# Patient Record
Sex: Female | Born: 1940 | Race: White | Hispanic: No | Smoking: Former smoker
Health system: Southern US, Community
[De-identification: ages and names within clinical notes are randomized; demographics above are authoritative.]

## PROBLEM LIST (undated history)

## (undated) DIAGNOSIS — E7800 Pure hypercholesterolemia, unspecified: Secondary | ICD-10-CM

## (undated) DIAGNOSIS — R51 Headache: Secondary | ICD-10-CM

## (undated) DIAGNOSIS — M810 Age-related osteoporosis without current pathological fracture: Secondary | ICD-10-CM

## (undated) DIAGNOSIS — R109 Unspecified abdominal pain: Secondary | ICD-10-CM

## (undated) DIAGNOSIS — T8859XA Other complications of anesthesia, initial encounter: Secondary | ICD-10-CM

## (undated) DIAGNOSIS — K2100 Gastro-esophageal reflux disease with esophagitis, without bleeding: Secondary | ICD-10-CM

## (undated) DIAGNOSIS — G43909 Migraine, unspecified, not intractable, without status migrainosus: Secondary | ICD-10-CM

## (undated) DIAGNOSIS — R05 Cough: Secondary | ICD-10-CM

## (undated) DIAGNOSIS — I10 Essential (primary) hypertension: Secondary | ICD-10-CM

## (undated) DIAGNOSIS — R059 Cough, unspecified: Secondary | ICD-10-CM

## (undated) DIAGNOSIS — K5900 Constipation, unspecified: Secondary | ICD-10-CM

## (undated) DIAGNOSIS — N816 Rectocele: Secondary | ICD-10-CM

## (undated) DIAGNOSIS — M545 Low back pain, unspecified: Secondary | ICD-10-CM

## (undated) DIAGNOSIS — J019 Acute sinusitis, unspecified: Secondary | ICD-10-CM

## (undated) DIAGNOSIS — F329 Major depressive disorder, single episode, unspecified: Secondary | ICD-10-CM

## (undated) DIAGNOSIS — F32A Depression, unspecified: Secondary | ICD-10-CM

## (undated) DIAGNOSIS — L255 Unspecified contact dermatitis due to plants, except food: Secondary | ICD-10-CM

## (undated) DIAGNOSIS — E785 Hyperlipidemia, unspecified: Secondary | ICD-10-CM

## (undated) DIAGNOSIS — Z0389 Encounter for observation for other suspected diseases and conditions ruled out: Secondary | ICD-10-CM

## (undated) DIAGNOSIS — R21 Rash and other nonspecific skin eruption: Secondary | ICD-10-CM

## (undated) DIAGNOSIS — R252 Cramp and spasm: Secondary | ICD-10-CM

## (undated) DIAGNOSIS — F3289 Other specified depressive episodes: Secondary | ICD-10-CM

## (undated) HISTORY — DX: Cramp and spasm: R25.2

## (undated) HISTORY — DX: Gastro-esophageal reflux disease with esophagitis: K21.0

## (undated) HISTORY — DX: Major depressive disorder, single episode, unspecified: F32.9

## (undated) HISTORY — DX: Headache: R51

## (undated) HISTORY — DX: Depression, unspecified: F32.A

## (undated) HISTORY — DX: Hyperlipidemia, unspecified: E78.5

## (undated) HISTORY — DX: Constipation, unspecified: K59.00

## (undated) HISTORY — PX: APPENDECTOMY: SHX54

## (undated) HISTORY — DX: Rectocele: N81.6

## (undated) HISTORY — DX: Encounter for observation for other suspected diseases and conditions ruled out: Z03.89

## (undated) HISTORY — DX: Migraine, unspecified, not intractable, without status migrainosus: G43.909

## (undated) HISTORY — DX: Cough: R05

## (undated) HISTORY — DX: Essential (primary) hypertension: I10

## (undated) HISTORY — DX: Other specified depressive episodes: F32.89

## (undated) HISTORY — DX: Low back pain: M54.5

## (undated) HISTORY — DX: Pure hypercholesterolemia, unspecified: E78.00

## (undated) HISTORY — DX: Unspecified abdominal pain: R10.9

## (undated) HISTORY — DX: Rash and other nonspecific skin eruption: R21

## (undated) HISTORY — DX: Acute sinusitis, unspecified: J01.90

## (undated) HISTORY — DX: Low back pain, unspecified: M54.50

## (undated) HISTORY — DX: Gastro-esophageal reflux disease with esophagitis, without bleeding: K21.00

## (undated) HISTORY — DX: Age-related osteoporosis without current pathological fracture: M81.0

## (undated) HISTORY — DX: Unspecified contact dermatitis due to plants, except food: L25.5

## (undated) HISTORY — PX: BREAST BIOPSY: SHX20

## (undated) HISTORY — DX: Cough, unspecified: R05.9

---

## 2000-02-20 ENCOUNTER — Encounter: Admission: RE | Admit: 2000-02-20 | Discharge: 2000-02-20 | Payer: Self-pay | Admitting: Family Medicine

## 2000-02-20 ENCOUNTER — Encounter: Payer: Self-pay | Admitting: Family Medicine

## 2000-09-05 ENCOUNTER — Encounter: Payer: Self-pay | Admitting: Family Medicine

## 2000-09-05 ENCOUNTER — Encounter: Admission: RE | Admit: 2000-09-05 | Discharge: 2000-09-05 | Payer: Self-pay | Admitting: Family Medicine

## 2001-02-16 ENCOUNTER — Encounter: Payer: Self-pay | Admitting: Family Medicine

## 2001-02-16 ENCOUNTER — Encounter: Admission: RE | Admit: 2001-02-16 | Discharge: 2001-02-16 | Payer: Self-pay | Admitting: Family Medicine

## 2002-02-18 ENCOUNTER — Encounter: Payer: Self-pay | Admitting: Family Medicine

## 2002-02-18 ENCOUNTER — Encounter: Admission: RE | Admit: 2002-02-18 | Discharge: 2002-02-18 | Payer: Self-pay | Admitting: Family Medicine

## 2003-02-22 ENCOUNTER — Encounter: Admission: RE | Admit: 2003-02-22 | Discharge: 2003-02-22 | Payer: Self-pay | Admitting: Family Medicine

## 2003-02-22 ENCOUNTER — Encounter: Payer: Self-pay | Admitting: Family Medicine

## 2004-03-28 ENCOUNTER — Encounter: Admission: RE | Admit: 2004-03-28 | Discharge: 2004-03-28 | Payer: Self-pay | Admitting: Family Medicine

## 2004-04-15 ENCOUNTER — Emergency Department (HOSPITAL_COMMUNITY): Admission: EM | Admit: 2004-04-15 | Discharge: 2004-04-16 | Payer: Self-pay | Admitting: Emergency Medicine

## 2005-03-29 ENCOUNTER — Encounter: Admission: RE | Admit: 2005-03-29 | Discharge: 2005-03-29 | Payer: Self-pay | Admitting: Family Medicine

## 2006-04-08 ENCOUNTER — Encounter: Admission: RE | Admit: 2006-04-08 | Discharge: 2006-04-08 | Payer: Self-pay | Admitting: Family Medicine

## 2007-04-21 ENCOUNTER — Encounter: Admission: RE | Admit: 2007-04-21 | Discharge: 2007-04-21 | Payer: Self-pay | Admitting: Family Medicine

## 2007-08-25 ENCOUNTER — Encounter: Admission: RE | Admit: 2007-08-25 | Discharge: 2007-08-25 | Payer: Self-pay | Admitting: Family Medicine

## 2008-04-21 ENCOUNTER — Encounter: Admission: RE | Admit: 2008-04-21 | Discharge: 2008-04-21 | Payer: Self-pay | Admitting: Family Medicine

## 2009-04-24 ENCOUNTER — Encounter: Admission: RE | Admit: 2009-04-24 | Discharge: 2009-04-24 | Payer: Self-pay | Admitting: Family Medicine

## 2010-04-27 ENCOUNTER — Encounter: Admission: RE | Admit: 2010-04-27 | Discharge: 2010-04-27 | Payer: Self-pay | Admitting: Family Medicine

## 2011-04-22 ENCOUNTER — Other Ambulatory Visit: Payer: Self-pay | Admitting: Family Medicine

## 2011-04-22 DIAGNOSIS — Z1231 Encounter for screening mammogram for malignant neoplasm of breast: Secondary | ICD-10-CM

## 2011-04-25 ENCOUNTER — Ambulatory Visit (INDEPENDENT_AMBULATORY_CARE_PROVIDER_SITE_OTHER): Payer: Medicare Other | Admitting: Internal Medicine

## 2011-04-25 ENCOUNTER — Encounter: Payer: Self-pay | Admitting: Internal Medicine

## 2011-04-25 VITALS — BP 110/66 | HR 79 | Ht 64.25 in | Wt 125.4 lb

## 2011-04-25 DIAGNOSIS — G4720 Circadian rhythm sleep disorder, unspecified type: Secondary | ICD-10-CM

## 2011-04-25 NOTE — Patient Instructions (Signed)
Bright light may help you be more awake and ready to get up in the morning, so putting a light on a timer, and getting outdoors for a little while in the morning, can hep your brain know that it is morning.  Melatonin, 6 mg, 30 minutes before bed, can also help your brain understand that it it bedtime. Remember to turn the evening lighting down a little.  You can combine this with the hydroxyzine if you find that helps.

## 2011-04-25 NOTE — Progress Notes (Signed)
04/25/11- 70 year old female former smoker, wife of a patient here, referred courtesy of Dr. Doristine Counter for sleep medicine evaluation. She complains of difficulty falling asleep and difficulty getting up in the morning. She finds herself going to bed later than she used to and getting up later. This may have begun around the time she retired a year and a half ago. It takes up to an hour to fall asleep and she thinks she wakes 6 times during the night before getting up at 8 AM. While sleeping, she thinks she sleeps soundly. She estimates averaging 7-1/2 hours of sleep per night. She does word puzzles before falling asleep, rather than TV. She was working, bedtime is to be 10 PM and she got up at 5:30 AM. She feels that she ought to be keeping those same hours now, in the same way that she tries to manage her weight. She had a sleep study 04/24/2006 at [facility]. Sleep latency then was 10 minutes, REM latency 316 minutes with total sleep time 297 minutes. She was awake 45 minutes during the night, with 13 minutes and REM. AHI was 0. Oxygenation was normal with moderate snoring. She had a few limb jerks without significant arousal. She waits for her husband to go to bed, so that he won't disturb her. This contributes to her later bedtime. Her bedroom is comfortable. She has begun taking hydroxyzine at bedtime for sleep and for itching. Drinks 6 cups of coffee per day. Her concern is sleep schedule, not quality. She is not sleepy during the daytime.  ROS-see HPI Constitutional:   No-   weight loss, night sweats, fevers, chills, fatigue, lassitude. HEENT:   No-  headaches, difficulty swallowing, tooth/dental problems, sore throat,       No-  sneezing, itching, ear ache, nasal congestion, post nasal drip,  CV:  No-   chest pain, orthopnea, PND, swelling in lower extremities, anasarca, dizziness, palpitations Resp: No-   shortness of breath with exertion or at rest.              No-    productive cough,  No non-productive cough,  No- coughing up of blood.              No-   change in color of mucus.  No- wheezing.   Skin: No-   rash or lesions. GI:  No-   heartburn, indigestion, abdominal pain, nausea, vomiting, diarrhea,                 change in bowel habits, loss of appetite GU: No-   dysuria, change in color of urine, no urgency or frequency.  No- flank pain. MS:  No-   joint pain or swelling.  No- decreased range of motion.  No- back pain. Neuro-     nothing unusual Psych:  No- change in mood or affect. No depression or anxiety.  No memory loss.  OBJ General- Alert, Oriented, Affect-appropriate, Distress- none acute;  trim, almost tense lady. Skin- rash-none, lesions- none, excoriation- none Lymphadenopathy- none Head- atraumatic            Eyes- Gross vision intact, PERRLA, conjunctivae clear secretions            Ears- Hearing, canals-normal            Nose- Clear, no-Septal dev, mucus, polyps, erosion, perforation             Throat- Mallampati II , mucosa clear , drainage- none, tonsils- atrophic,  torus Neck- flexible , trachea midline, no stridor , thyroid nl, carotid no bruit Chest - symmetrical excursion , unlabored           Heart/CV- RRR , no murmur , no gallop  , no rub, nl s1 s2                           - JVD- none , edema- none, stasis changes- none, varices- none           Lung- clear to P&A, wheeze- none, cough- none , dullness-none, rub- none           Chest wall-  Abd- tender-no, distended-no, bowel sounds-present, HSM- no Br/ Gen/ Rectal- Not done, not indicated Extrem- cyanosis- none, clubbing, none, atrophy- none, strength- nl Neuro- grossly intact to observation

## 2011-04-26 DIAGNOSIS — G4720 Circadian rhythm sleep disorder, unspecified type: Secondary | ICD-10-CM | POA: Insufficient documentation

## 2011-04-26 NOTE — Assessment & Plan Note (Signed)
Her concern is that she may have a disorder of her sleep schedule. Since she retired, she has less structure. She is still maintaining 7-1/2 hours of sleep, without daytime tiredness. This may be enough for her. She admits she is staying up partly to accommodate her husband's sleep schedule. Otherwise I'm not sure she would really be much different from her earlier pattern. I don't see an objective problem. We did discuss some simple measures that can help her adjust her sleep schedule a bit. Plan-melatonin in the evening, sunlight in the morning. We reviewed the basics of good sleep hygiene.

## 2011-05-15 ENCOUNTER — Ambulatory Visit: Payer: Self-pay

## 2012-09-14 ENCOUNTER — Other Ambulatory Visit: Payer: Self-pay | Admitting: Family Medicine

## 2012-09-14 DIAGNOSIS — Z1231 Encounter for screening mammogram for malignant neoplasm of breast: Secondary | ICD-10-CM

## 2012-09-14 DIAGNOSIS — M810 Age-related osteoporosis without current pathological fracture: Secondary | ICD-10-CM

## 2012-10-09 ENCOUNTER — Ambulatory Visit
Admission: RE | Admit: 2012-10-09 | Discharge: 2012-10-09 | Disposition: A | Discharge status: Home / Self Care | Payer: Medicare Other | Source: Ambulatory Visit | Attending: Family Medicine | Admitting: Family Medicine

## 2012-10-09 DIAGNOSIS — Z1231 Encounter for screening mammogram for malignant neoplasm of breast: Secondary | ICD-10-CM

## 2012-10-09 DIAGNOSIS — M810 Age-related osteoporosis without current pathological fracture: Secondary | ICD-10-CM

## 2014-12-15 ENCOUNTER — Other Ambulatory Visit: Payer: Self-pay | Admitting: Family Medicine

## 2014-12-15 DIAGNOSIS — M810 Age-related osteoporosis without current pathological fracture: Secondary | ICD-10-CM

## 2014-12-21 ENCOUNTER — Ambulatory Visit
Admission: RE | Admit: 2014-12-21 | Discharge: 2014-12-21 | Disposition: A | Discharge status: Home / Self Care | Payer: Medicare Other | Source: Ambulatory Visit | Attending: Family Medicine | Admitting: Family Medicine

## 2014-12-21 DIAGNOSIS — M810 Age-related osteoporosis without current pathological fracture: Secondary | ICD-10-CM

## 2016-05-22 ENCOUNTER — Other Ambulatory Visit: Payer: Self-pay | Admitting: Family Medicine

## 2016-05-22 DIAGNOSIS — Z1231 Encounter for screening mammogram for malignant neoplasm of breast: Secondary | ICD-10-CM

## 2016-06-18 ENCOUNTER — Ambulatory Visit
Admission: RE | Admit: 2016-06-18 | Discharge: 2016-06-18 | Disposition: A | Discharge status: Home / Self Care | Payer: Medicare Other | Source: Ambulatory Visit | Attending: Family Medicine | Admitting: Family Medicine

## 2016-06-18 DIAGNOSIS — Z1231 Encounter for screening mammogram for malignant neoplasm of breast: Secondary | ICD-10-CM

## 2017-06-11 ENCOUNTER — Encounter: Payer: Self-pay | Admitting: Neurology

## 2017-06-11 ENCOUNTER — Ambulatory Visit: Payer: Medicare Other | Admitting: Neurology

## 2017-06-11 VITALS — BP 137/85 | HR 75 | Ht 64.5 in | Wt 126.6 lb

## 2017-06-11 DIAGNOSIS — M542 Cervicalgia: Secondary | ICD-10-CM | POA: Diagnosis not present

## 2017-06-11 DIAGNOSIS — R413 Other amnesia: Secondary | ICD-10-CM

## 2017-06-11 DIAGNOSIS — R2689 Other abnormalities of gait and mobility: Secondary | ICD-10-CM

## 2017-06-11 DIAGNOSIS — R270 Ataxia, unspecified: Secondary | ICD-10-CM

## 2017-06-11 DIAGNOSIS — M5000 Cervical disc disorder with myelopathy, unspecified cervical region: Secondary | ICD-10-CM | POA: Diagnosis not present

## 2017-06-11 DIAGNOSIS — R4189 Other symptoms and signs involving cognitive functions and awareness: Secondary | ICD-10-CM | POA: Insufficient documentation

## 2017-06-11 DIAGNOSIS — E538 Deficiency of other specified B group vitamins: Secondary | ICD-10-CM

## 2017-06-11 DIAGNOSIS — W19XXXA Unspecified fall, initial encounter: Secondary | ICD-10-CM
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
## 2017-06-11 NOTE — Patient Instructions (Signed)
MRI of the brain and cervical spine Labs Formal Neuropsych testing with Dr. Alinda DoomsBailar

## 2017-06-12 ENCOUNTER — Telehealth: Payer: Self-pay | Admitting: *Deleted

## 2017-06-12 NOTE — Telephone Encounter (Addendum)
Called and spoke with patient. She is aware that her labs look great. She has pending referrals/MRI written and will call if she does not hear anything by the end of next week.   ----- Message from Anson FretAntonia B Ahern, MD sent at 06/12/2017 10:02 AM EST ----- Labs look great thanks

## 2017-06-15 LAB — RPR: RPR Ser Ql: NONREACTIVE

## 2017-06-15 LAB — B12 AND FOLATE PANEL
Folate: >20 ng/mL (ref >3.0)
Vitamin B-12: >2000 pg/mL — ABNORMAL HIGH (ref 232–1245)

## 2017-06-15 LAB — METHYLMALONIC ACID, SERUM: Methylmalonic Acid: 130 nmol/L (ref 0–378)

## 2017-06-15 LAB — TSH: TSH: 2.66 u[IU]/mL (ref 0.450–4.500)

## 2017-06-18 ENCOUNTER — Encounter: Payer: Self-pay | Admitting: Psychology

## 2017-06-27 ENCOUNTER — Ambulatory Visit
Admission: RE | Admit: 2017-06-27 | Discharge: 2017-06-27 | Disposition: A | Discharge status: Home / Self Care | Payer: Medicare Other | Source: Ambulatory Visit | Attending: Neurology | Admitting: Neurology

## 2017-06-27 DIAGNOSIS — R270 Ataxia, unspecified: Secondary | ICD-10-CM

## 2017-06-27 DIAGNOSIS — W19XXXA Unspecified fall, initial encounter: Secondary | ICD-10-CM

## 2017-06-27 DIAGNOSIS — R413 Other amnesia: Secondary | ICD-10-CM | POA: Diagnosis not present

## 2017-06-27 DIAGNOSIS — E538 Deficiency of other specified B group vitamins: Secondary | ICD-10-CM

## 2017-06-27 DIAGNOSIS — R2689 Other abnormalities of gait and mobility: Secondary | ICD-10-CM | POA: Diagnosis not present

## 2017-06-27 DIAGNOSIS — M542 Cervicalgia: Secondary | ICD-10-CM | POA: Diagnosis not present

## 2017-06-27 DIAGNOSIS — M5000 Cervical disc disorder with myelopathy, unspecified cervical region: Secondary | ICD-10-CM

## 2017-06-30 ENCOUNTER — Telehealth: Payer: Self-pay | Admitting: *Deleted

## 2017-06-30 NOTE — Telephone Encounter (Addendum)
Called and spoke with the patient. I discussed her MRI results of C-spine & brain. She verbalized understanding that her MRI of the brain is  unremarkable. No etiology of symptoms. 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 Also verbalized understanding that her MRI of the cervical spine is unremarkable. Incidentally seen is a cyst in the neck on the right. Likely a benign cyst that she has had for a long time. Patient can be referred to ENT if she wishes per Dr. Lucia GaskinsAhern. Patient's questions were answered. She will f/u with Dr. Lucia GaskinsAhern in April 2019 office visit. She will call us back if she has any questions or if she needs to be seen sooner. I also advised that if she has any new neurological symptoms such as (difficulty speaking, processing, severe headache, dizziness, unilateral numbness, weakness, etc) to call 911 and she verbalized understanding.    ----- Message from Anson FretAntonia B Ahern, MD sent at 06/28/2017 11:50 AM EST ----- MRI of the brain unremarkable. No etiology of symptoms. 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  thanks  MRi of the cervical spine unremarkable. Incidentally seen is a cyst in the neck on the right. 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 A branchial cleft cyst are most commonly located along the anterior border and the upper third of the sternocleidomastoid muscle in the neck and are congenital and benign.

## 2017-07-24 ENCOUNTER — Ambulatory Visit: Payer: Medicare Other | Admitting: Neurology

## 2017-10-13 ENCOUNTER — Telehealth: Payer: Self-pay | Admitting: *Deleted

## 2017-10-13 ENCOUNTER — Ambulatory Visit: Payer: Medicare Other | Admitting: Neurology

## 2017-10-13 NOTE — Telephone Encounter (Addendum)
Notified Dr. Lucia GaskinsAhern. She will wait and see what the results are from Dr. Daneen SchickBailer. Keep appt for 7.16 for now.
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
## 2017-10-13 NOTE — Telephone Encounter (Addendum)
Spoke with Dr. Lucia GaskinsAhern and Phyllis [name]. Her 4/22 appt was canceled and will be rescheduled for after appts with Dr. Wonda CeriseBailer Heath as Dr. Lucia GaskinsAhern will have more information to discuss with Phyllis [name] after those appt. Phyllis [name] was appreciative and requests that today's copay be applied to her next visit. She will call back to r/s when she gets home and has her schedule. Please schedule her for after 10/27/17 as that will be her f/u with Dr. Daneen SchickBailer.

## 2017-10-13 NOTE — Telephone Encounter (Signed)
FYI-I scheduled patient on 01-06-18 with Dr. Lucia GaskinsAhern which is the soonest appointment after 10-27-17 appointment with Dr. Daneen SchickBailer. I did put her on a wait list if a sooner appointment becomes available.

## 2017-10-16 ENCOUNTER — Encounter: Payer: Self-pay | Admitting: Psychology

## 2017-10-16 ENCOUNTER — Ambulatory Visit (INDEPENDENT_AMBULATORY_CARE_PROVIDER_SITE_OTHER): Payer: Medicare Other | Admitting: Psychology

## 2017-10-16 ENCOUNTER — Encounter: Payer: Self-pay | Admitting: Neurology

## 2017-10-16 ENCOUNTER — Ambulatory Visit: Payer: Medicare Other | Admitting: Psychology

## 2017-10-16 DIAGNOSIS — R413 Other amnesia: Secondary | ICD-10-CM | POA: Diagnosis not present

## 2017-10-16 DIAGNOSIS — F339 Major depressive disorder, recurrent, unspecified: Secondary | ICD-10-CM

## 2017-10-16 NOTE — Progress Notes (Addendum)
   Neuropsychology Note  Phyllis EvertsLynda K [name] completed 60 minutes of neuropsychological testing with technician, Phyllis [name], BS, under the supervision of Dr. Elvis CoilMaryBeth Bailar, Licensed Psychologist. The patient did not appear overtly distressed by the testing session, per behavioral observation or via self-report to the technician. Rest breaks were offered.   Clinical Decision Making: In considering the patient's current level of functioning, level of presumed impairment, nature of symptoms, emotional and behavioral responses during the interview, level of literacy, and observed level of motivation/effort, a battery of tests was selected and communicated to the psychometrician.  Communication between the psychologist and technician was ongoing throughout the testing session and changes were made as deemed necessary based on patient performance on testing, technician observations and additional pertinent factors such as those listed above.  Phyllis EvertsLynda K [name] will return within approximately 2 weeks for an interactive feedback session with Phyllis [name] at which time her test performances, clinical impressions and treatment recommendations will be reviewed in detail. The patient understands she can contact our office should she require our assistance before this time.  15 minutes spent performing neuropsychological evaluation services/clinical decision making (psychologist). [CPT 96132] 60 minutes spent face-to-face with patient administering standardized tests, 30 minutes spent scoring (technician). [CPT [id number], 96139]  Full report to follow.
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
## 2017-10-27 ENCOUNTER — Encounter: Payer: Self-pay | Admitting: Psychology

## 2017-10-27 ENCOUNTER — Ambulatory Visit (INDEPENDENT_AMBULATORY_CARE_PROVIDER_SITE_OTHER): Payer: Medicare Other | Admitting: Psychology

## 2017-10-27 DIAGNOSIS — R413 Other amnesia: Secondary | ICD-10-CM | POA: Diagnosis not present

## 2017-10-27 NOTE — Patient Instructions (Addendum)
Fortunately, results of cognitive testing did NOT indicate dementia or Alzheimer's disease.  It is most likely the case that your cognitive symptoms in daily life are due to depression and anxiety.   I am hopeful that more aggressive treatment of depression and anxiety (e.g. psychotherapy) will not only improve your mood and coping, but also result in improved cognitive functioning in your daily life.  Additional recommendations provided to Dr. Lucia Gaskins: -- Consideration of medication change for depression/anxiety is recommended. She would also likely benefit from psychotherapy to assist in improving mood and coping with anxiety. -- Optimal control of vascular risk factors is encouraged. She should get safe cardiovascular exercise on a regular basis if possible.  -- She should continue to do her craft projects and other activities for mental stimulation. She should continue to keep up her social relationships and interactions. These behaviors will enhance mood and also help in terms of brain health.    The effect of depression and anxiety on your cognitive functioning: . One of the typical symptoms of depression is difficulty concentrating and making decisions, and various types of anxiety also interfere with attention and concentration . Problems with attention and concentration can disrupt the process of learning and making new memories, which can make it seem like there is a problem with your memory. In your daily life, you may experience this disruption as forgetting names and appointments, misplacing items, and needing to make lists for shopping and errands. It may be harder for you to stay focused on tasks and feel as "sharp" as you did in the past.  . Also, when we are depressed or anxious, we often pay more attention to our difficulties (rather than our strengths) in our daily life, and this can make it seem to [place] like we are doing worse cognitively than we really are. . The cognitive aspects  of depression and anxiety are sometimes observed as an identifiable pattern of poor performance on a neuropsychological evaluation, but it is also possible that all scores on an evaluation are within normal limits. . Regardless of the test scores, distress related to depression and anxiety can interfere with the ability to make use of your cognitive resources and function optimally across settings such as work or school, maintaining the home and responsibilities, and personal relationships. . Fortunately, there are treatments for depression and anxiety, and when mood improves, cognitive functioning in daily life often improves. . Treatment options include psychotherapy, medications (e.g., antidepressants), and behavioral changes, such as increasing your involvement in enjoyable activities, increasing the amount of exercise you are getting, and maintaining a regular routine.

## 2017-11-10 ENCOUNTER — Encounter: Payer: Medicare Other | Admitting: Psychology

## 2017-11-25 ENCOUNTER — Other Ambulatory Visit: Payer: Self-pay | Admitting: Family Medicine

## 2017-11-25 DIAGNOSIS — Z1231 Encounter for screening mammogram for malignant neoplasm of breast: Secondary | ICD-10-CM

## 2017-11-25 DIAGNOSIS — M810 Age-related osteoporosis without current pathological fracture: Secondary | ICD-10-CM

## 2018-01-06 ENCOUNTER — Encounter

## 2018-01-06 ENCOUNTER — Encounter: Payer: Self-pay | Admitting: Neurology

## 2018-01-06 ENCOUNTER — Ambulatory Visit: Payer: Medicare Other | Admitting: Neurology

## 2018-01-06 VITALS — BP 153/87 | HR 96 | Ht 64.25 in | Wt 127.0 lb

## 2018-01-06 DIAGNOSIS — F900 Attention-deficit hyperactivity disorder, predominantly inattentive type: Secondary | ICD-10-CM | POA: Diagnosis not present

## 2018-01-06 DIAGNOSIS — F988 Other specified behavioral and emotional disorders with onset usually occurring in childhood and adolescence: Secondary | ICD-10-CM | POA: Insufficient documentation

## 2018-01-06 NOTE — Patient Instructions (Addendum)
Living With Attention Deficit Hyperactivity Disorder  If you have been diagnosed with attention deficit hyperactivity disorder (ADHD), you may be relieved that you now know why you have felt or behaved a certain way. Still, you may feel overwhelmed about the treatment ahead. You may also wonder how to get the support you need and how to deal with the condition day-to-day. With treatment and support, you can live with ADHD and manage your symptoms.  How to manage lifestyle changes  Managing stress  Stress is your body's reaction to life changes and events, both good and bad. To cope with the stress of an ADHD diagnosis, it may help to:   Learn more about ADHD.   Exercise regularly. Even a short daily walk can lower stress levels.   Participate in training or education programs (including social skills training classes) that teach you to deal with symptoms.    Medicines  Your health care provider may suggest certain medicines if he or she feels that they will help to improve your condition. Stimulant medicines are usually prescribed to treat ADHD, and therapy may also be prescribed. It is important to:   Avoid using alcohol and other substances that may prevent your medicines from working properly (mayinteract).   Talk with your pharmacist or health care provider about all the medicines that you take, their possible side effects, and what medicines are safe to take together.   Make it your goal to take part in all treatment decisions (shared decision-making). Ask about possible side effects of medicines that your health care provider recommends, and tell him or her how you feel about having those side effects. It is best if shared decision-making with your health care provider is part of your total treatment plan.    Relationships  To strengthen your relationships with family members while treating your condition, consider taking part in family therapy. You might also attend self-help groups alone or with a  loved one.  Be honest about how your symptoms affect your relationships. Make an effort to communicate respectfully instead of fighting, and find ways to show others that you care. Psychotherapy may be useful in helping you cope with how ADHD affects your relationships.  How to recognize changes in your condition  The following signs may mean that your treatment is working well and your condition is improving:   Consistently being on time for appointments.   Being more organized at home and work.   Other people noticing improvements in your behavior.   Achieving goals that you set for yourself.   Thinking more clearly.    The following signs may mean that your treatment is not working very well:   Feeling impatience or more confusion.   Missing, forgetting, or being late for appointments.   An increasing sense of disorganization and messiness.   More difficulty in reaching goals that you set for yourself.   Loved ones becoming angry or frustrated with you.    Where to find support  Talking to others   Keep emotion out of important discussions and speak in a calm, logical way.   Listen closely and patiently to your loved ones. Try to understand their point of view, and try to avoid getting defensive.   Take responsibility for the consequences of your actions.   Ask that others do not take your behaviors personally.   Aim to solve problems as they come up, and express your feelings instead of bottling them up.   Talk openly about   what you need from your loved ones and how they can support you.   Consider going to family therapy sessions or having your family meet with a specialist who deals with ADHD-related behavior problems.  Finances  Not all insurance plans cover mental health care, so it is important to check with your insurance carrier. If paying for co-pays or counseling services is a problem, search for a local or county mental health care center. Public mental health care services may be  offered there at a low cost or no cost when you are not able to see a private health care provider.  If you are taking medicine for ADHD, you may be able to get the generic form, which may be less expensive than brand-name medicine. Some makers of prescription medicines also offer help to patients who cannot afford the medicines that they need.  Follow these instructions at home:   Take over-the-counter and prescription medicines only as told by your health care provider. Check with your health care provider before taking any new medicines.   Create structure and an organized atmosphere at home. For example:  ? Make a list of tasks, then rank them from most important to least important. Work on one task at a time until your listed tasks are done.  ? Make a daily schedule and follow it consistently every day.  ? Use an appointment calendar, and check it 2 or 3 times a day to keep on track. Keep it with you when you leave the house.  ? Create spaces where you keep certain things, and always put things back in their places after you use them.   Keep all follow-up visits as told by your health care provider. This is important.  Questions to ask your health care provider:   What are the risks and benefits of taking medicines?   Would I benefit from therapy?   How often should I follow up with a health care provider?  Contact a health care provider if:   You have side effects from your medicines, such as:  ? Repeated muscle twitches, coughing, or speech outbursts.  ? Sleep problems.  ? Loss of appetite.  ? Depression.  ? New or worsening behavior problems.  ? Dizziness.  ? Unusually fast heartbeat.  ? Stomach pains.  ? Headaches.  Get help right away if:   You have a severe reaction to a medicine.   Your behavior suddenly gets worse.  Summary   With treatment and support, you can live with ADHD and manage your symptoms.   The medicines that are most often prescribed for ADHD are stimulants.   Consider taking  part in family therapy or self-help groups with family members or friends.   When you talk with friends and family about your ADHD, be patient and communicate openly.   Take over-the-counter and prescription medicines only as told by your health care provider. Check with your health care provider before taking any new medicines.  This information is not intended to replace advice given to you by your health care provider. Make sure you discuss any questions you have with your health care provider.  Document Released: 10/10/2016 Document Revised: 10/10/2016 Document Reviewed: 10/10/2016  [employer] Interactive Patient Education  2018 [employer]  Attention Deficit Hyperactivity Disorder, Pediatric  Attention deficit hyperactivity disorder (ADHD) is a condition that can make it hard for a child to pay attention and concentrate or to control his or her behavior. The child may also have   a lot of energy. ADHD is a disorder of the brain (neurodevelopmental disorder), and symptoms are typically first seen in early childhood. It is a common reason for behavioral and academic problems in school.  There are three main types of ADHD:   Inattentive. With this type, children have difficulty paying attention.   Hyperactive-impulsive. With this type, children have a lot of energy and have difficulty controlling their behavior.   Combination. This type involves having symptoms of both of the other types.    ADHD is a lifelong condition. If it is not treated, the disorder can affect a child's future academic achievement, employment, and relationships.  What are the causes?  The exact cause of this condition is not known.  What increases the risk?  This condition is more likely to develop in:   Children who have a first-degree relative, such as a parent or brother or sister, with the condition.   Children who had a low birth weight.   Children whose mothers had problems during pregnancy or used alcohol or tobacco during  pregnancy.   Children who have had a brain infection or a head injury.   Children who have been exposed to lead.    What are the signs or symptoms?  Symptoms of this condition depend on the type of ADHD. Symptoms are listed here for each type:  Inattentive   Problems with organization.   Difficulty staying focused.   Problems completing assignments at school.   Often making simple mistakes.   Problems sustaining mental effort.   Not listening to instructions.   Losing things often.   Forgetting things often.   Being easily distracted.  Hyperactive-impulsive   Fidgeting often.   Difficulty sitting still in one's seat.   Talking a lot.   Talking out of turn.   Interrupting others.   Difficulty relaxing or doing quiet activities.   High energy levels and constant movement.   Difficulty waiting.   Always "on the go."  Combination   Having symptoms of both of the other types.  Children with ADHD may feel frustrated with themselves and may find school to be particularly discouraging. They often perform below their abilities in school.  As children get older, the excess movement can lessen, but the problems with paying attention and staying organized often continue. Most children do not outgrow ADHD, but with good treatment, they can learn to cope with the symptoms.  How is this diagnosed?  This condition is diagnosed based on a child's symptoms and academic history. The child's health care provider will do a complete assessment. As part of the assessment, the health care provider will ask the child questions and will ask the parents and teachers for their observations of the child. The health care provider looks for specific symptoms of ADHD.  Diagnosis will include:   Ruling out other reasons for the child's behavior.   Reviewing behavior rating scales that have been filled out about the child by people who deal with the child on a daily basis.    A diagnosis is made only after all information from  multiple people has been considered.  How is this treated?  Treatment for this condition may include:   Behavior therapy.   Medicines to decrease impulsivity and hyperactivity and to increase attention. Behavior therapy is preferred for children younger than 6 years old. The combination of medicine and behavior therapy is most effective for children older than 6 years of age.   Tutoring or extra   support at school.   Techniques for parents to use at home to help manage their child's symptoms and behavior.    Follow these instructions at home:  Eating and drinking   Offer your child a well-balanced diet. Breakfast that includes a balance of whole grains, protein, and fruits or vegetables is especially important for school performance.   If your child has trouble with hyperactivity, have your child avoid drinks that contain caffeine. These include:  ? Soft drinks.  ? Coffee.  ? Tea.   If your child is older and finds that caffeinated drinks help to improve his or her attention, talk with your child's health care provider about what amount of caffeine intake is a safe for your child.  Lifestyle     Make sure your child gets a full night of sleep and regular daily exercise.   Help manage your child's behavior by following the techniques learned in therapy. These may include:  ? Looking for good behavior and rewarding it.  ? Making rules for behavior that your child can understand and follow.  ? Giving clear instructions.  ? Responding consistently to your child's challenging behaviors.  ? Setting realistic goals.  ? Looking for activities that can lead to success and self-esteem.  ? Making time for pleasant activities with your child.  ? Giving lots of affection.   Help your child learn to be organized. Some ways to do this include:  ? Keeping daily schedules the same. Have a regular wake-up time and bedtime for your child. Schedule all activities, including time for homework and time for play. Post the  schedule in a place where your child will see it. Mark schedule changes in advance.  ? Having a regular place for your child to store items such as clothing, backpacks, and school supplies.  ? Encouraging your child to write down school assignments and to bring home needed books. Work with your child's teachers for assistance in organizing school work.  General instructions   Learn as much as you can about ADHD. This will improve your ability to help your child and to make sure he or she gets the support needed. It will also help you educate your child's teachers and instructors if they do not feel that they have adequate knowledge or experience in these areas.   Work with your child's teachers to make sure your child gets the support and extra help that is needed. This may include:  ? Tutoring.  ? Teacher cues to help your child remain on task.  ? Seating changes so your child is working at a desk that is free from distractions.   Give over-the-counter and prescription medicines only as told by your child's health care provider.   Keep all follow-up visits as told by your health care provider. This is important.  Contact a health care provider if:   Your child has repeated muscle twitches (tics), coughs, or speech outbursts.   Your child has sleep problems.   Your child has a marked loss of appetite.   Your child develops depression.   Your child has new or worsening behavioral problems.   Your child has dizziness.   Your child has a racing heart.   Your child has stomach pains.   Your child develops headaches.  Get help right away if:   Your child talks about or threatens suicide.   You are worried that your child is having a bad reaction to a medicine that he or she   is taking for ADHD.  This information is not intended to replace advice given to you by your health care provider. Make sure you discuss any questions you have with your health care provider.  Document Released: 05/31/2002 Document  Revised: 02/07/2016 Document Reviewed: 01/04/2016  [employer] Interactive Patient Education  2018 [employer]
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
## 2018-01-08 ENCOUNTER — Ambulatory Visit
Admission: RE | Admit: 2018-01-08 | Discharge: 2018-01-08 | Disposition: A | Discharge status: Home / Self Care | Payer: Medicare Other | Source: Ambulatory Visit | Attending: Family Medicine | Admitting: Family Medicine

## 2018-01-08 DIAGNOSIS — M810 Age-related osteoporosis without current pathological fracture: Secondary | ICD-10-CM

## 2018-01-08 DIAGNOSIS — Z1231 Encounter for screening mammogram for malignant neoplasm of breast: Secondary | ICD-10-CM

## 2018-03-08 ENCOUNTER — Encounter (HOSPITAL_COMMUNITY)
Admission: EM | Disposition: A | Discharge status: Home / Self Care | Payer: Self-pay | Source: Home / Self Care | Attending: Internal Medicine

## 2018-03-08 ENCOUNTER — Inpatient Hospital Stay (HOSPITAL_COMMUNITY)
Admission: EM | Admit: 2018-03-08 | Discharge: 2018-03-10 | DRG: 378 | Disposition: A | Discharge status: Home / Self Care | Payer: Medicare Other | Attending: Internal Medicine | Admitting: Internal Medicine

## 2018-03-08 ENCOUNTER — Other Ambulatory Visit: Payer: Self-pay

## 2018-03-08 ENCOUNTER — Encounter (HOSPITAL_COMMUNITY): Payer: Self-pay | Admitting: Emergency Medicine

## 2018-03-08 ENCOUNTER — Emergency Department (HOSPITAL_COMMUNITY): Payer: Medicare Other

## 2018-03-08 ENCOUNTER — Inpatient Hospital Stay (HOSPITAL_COMMUNITY): Payer: Medicare Other | Admitting: Certified Registered Nurse Anesthetist

## 2018-03-08 DIAGNOSIS — K922 Gastrointestinal hemorrhage, unspecified: Secondary | ICD-10-CM | POA: Diagnosis present

## 2018-03-08 DIAGNOSIS — K2901 Acute gastritis with bleeding: Secondary | ICD-10-CM

## 2018-03-08 DIAGNOSIS — M1990 Unspecified osteoarthritis, unspecified site: Secondary | ICD-10-CM | POA: Diagnosis present

## 2018-03-08 DIAGNOSIS — I10 Essential (primary) hypertension: Secondary | ICD-10-CM | POA: Diagnosis present

## 2018-03-08 DIAGNOSIS — R55 Syncope and collapse: Secondary | ICD-10-CM

## 2018-03-08 DIAGNOSIS — K449 Diaphragmatic hernia without obstruction or gangrene: Secondary | ICD-10-CM | POA: Diagnosis present

## 2018-03-08 DIAGNOSIS — R195 Other fecal abnormalities: Secondary | ICD-10-CM | POA: Diagnosis present

## 2018-03-08 DIAGNOSIS — F419 Anxiety disorder, unspecified: Secondary | ICD-10-CM | POA: Diagnosis present

## 2018-03-08 DIAGNOSIS — T39395A Adverse effect of other nonsteroidal anti-inflammatory drugs [NSAID], initial encounter: Secondary | ICD-10-CM | POA: Diagnosis present

## 2018-03-08 DIAGNOSIS — Z79899 Other long term (current) drug therapy: Secondary | ICD-10-CM | POA: Diagnosis not present

## 2018-03-08 DIAGNOSIS — Z87891 Personal history of nicotine dependence: Secondary | ICD-10-CM | POA: Diagnosis not present

## 2018-03-08 DIAGNOSIS — D649 Anemia, unspecified: Secondary | ICD-10-CM | POA: Diagnosis present

## 2018-03-08 DIAGNOSIS — K5909 Other constipation: Secondary | ICD-10-CM | POA: Diagnosis present

## 2018-03-08 DIAGNOSIS — I959 Hypotension, unspecified: Secondary | ICD-10-CM | POA: Diagnosis present

## 2018-03-08 DIAGNOSIS — E785 Hyperlipidemia, unspecified: Secondary | ICD-10-CM | POA: Diagnosis present

## 2018-03-08 DIAGNOSIS — K2100 Gastro-esophageal reflux disease with esophagitis, without bleeding: Secondary | ICD-10-CM | POA: Diagnosis present

## 2018-03-08 DIAGNOSIS — K254 Chronic or unspecified gastric ulcer with hemorrhage: Secondary | ICD-10-CM | POA: Diagnosis present

## 2018-03-08 DIAGNOSIS — D62 Acute posthemorrhagic anemia: Secondary | ICD-10-CM | POA: Diagnosis present

## 2018-03-08 HISTORY — PX: ESOPHAGOGASTRODUODENOSCOPY (EGD) WITH PROPOFOL: SHX5813

## 2018-03-08 LAB — I-STAT CHEM 8, ED
BUN: 47 mg/dL — ABNORMAL HIGH (ref 8–23)
Calcium, Ion: 1.25 mmol/L (ref 1.15–1.40)
Chloride: 108 mmol/L (ref 98–111)
Creatinine, Ser: 0.8 mg/dL (ref 0.44–1.00)
Glucose, Bld: 104 mg/dL — ABNORMAL HIGH (ref 70–99)
HCT: 26 % — ABNORMAL LOW (ref 36.0–46.0)
Hemoglobin: 8.8 g/dL — ABNORMAL LOW (ref 12.0–15.0)
Potassium: 4.3 mmol/L (ref 3.5–5.1)
Sodium: 141 mmol/L (ref 135–145)
TCO2: 24 mmol/L (ref 22–32)

## 2018-03-08 LAB — CBC WITH DIFFERENTIAL/PLATELET
Abs Immature Granulocytes: 0.1 10*3/uL (ref 0.0–0.1)
Basophils Absolute: 0.1 10*3/uL (ref 0.0–0.1)
Basophils Relative: 1 %
Eosinophils Absolute: 0.1 10*3/uL (ref 0.0–0.7)
Eosinophils Relative: 1 %
HCT: 29.2 % — ABNORMAL LOW (ref 36.0–46.0)
Hemoglobin: 9.2 g/dL — ABNORMAL LOW (ref 12.0–15.0)
Immature Granulocytes: 1 %
Lymphocytes Relative: 14 %
Lymphs Abs: 1.4 10*3/uL (ref 0.7–4.0)
MCH: 31.7 pg (ref 26.0–34.0)
MCHC: 31.5 g/dL (ref 30.0–36.0)
MCV: 100.7 fL — ABNORMAL HIGH (ref 78.0–100.0)
Monocytes Absolute: 0.6 10*3/uL (ref 0.1–1.0)
Monocytes Relative: 5 %
Neutro Abs: 8.2 10*3/uL — ABNORMAL HIGH (ref 1.7–7.7)
Neutrophils Relative %: 78 %
Platelets: 213 10*3/uL (ref 150–400)
RBC: 2.9 MIL/uL — ABNORMAL LOW (ref 3.87–5.11)
RDW: 12.7 % (ref 11.5–15.5)
WBC: 10.3 10*3/uL (ref 4.0–10.5)

## 2018-03-08 LAB — COMPREHENSIVE METABOLIC PANEL
ALT: <5 U/L (ref 0–44)
AST: 23 U/L (ref 15–41)
Albumin: 3.2 g/dL — ABNORMAL LOW (ref 3.5–5.0)
Alkaline Phosphatase: 36 U/L — ABNORMAL LOW (ref 38–126)
Anion gap: 11 (ref 5–15)
BUN: 48 mg/dL — ABNORMAL HIGH (ref 8–23)
CO2: 25 mmol/L (ref 22–32)
Calcium: 9.3 mg/dL (ref 8.9–10.3)
Chloride: 106 mmol/L (ref 98–111)
Creatinine, Ser: 0.73 mg/dL (ref 0.44–1.00)
GFR calc Af Amer: >60 mL/min (ref >60)
GFR calc non Af Amer: >60 mL/min (ref >60)
Glucose, Bld: 105 mg/dL — ABNORMAL HIGH (ref 70–99)
Potassium: 4.2 mmol/L (ref 3.5–5.1)
Sodium: 142 mmol/L (ref 135–145)
Total Bilirubin: 0.2 mg/dL — ABNORMAL LOW (ref 0.3–1.2)
Total Protein: 5 g/dL — ABNORMAL LOW (ref 6.5–8.1)

## 2018-03-08 LAB — URINALYSIS, ROUTINE W REFLEX MICROSCOPIC
Bilirubin Urine: NEGATIVE
Glucose, UA: NEGATIVE mg/dL
Hgb urine dipstick: NEGATIVE
Ketones, ur: 20 mg/dL — AB
Leukocytes, UA: NEGATIVE
Nitrite: NEGATIVE
Protein, ur: NEGATIVE mg/dL
Specific Gravity, Urine: 1.021 (ref 1.005–1.030)
pH: 5 (ref 5.0–8.0)

## 2018-03-08 LAB — POC OCCULT BLOOD, ED: Fecal Occult Bld: POSITIVE — AB

## 2018-03-08 LAB — I-STAT TROPONIN, ED: Troponin i, poc: 0 ng/mL (ref 0.00–0.08)

## 2018-03-08 LAB — BRAIN NATRIURETIC PEPTIDE: B Natriuretic Peptide: 31.5 pg/mL (ref 0.0–100.0)

## 2018-03-08 LAB — D-DIMER, QUANTITATIVE: D-Dimer, Quant: 0.38 ug/mL-FEU (ref 0.00–0.50)

## 2018-03-08 LAB — HEMOGLOBIN AND HEMATOCRIT, BLOOD
HCT: 22.4 % — ABNORMAL LOW (ref 36.0–46.0)
Hemoglobin: 7.1 g/dL — ABNORMAL LOW (ref 12.0–15.0)

## 2018-03-08 LAB — MRSA PCR SCREENING: MRSA by PCR: NEGATIVE

## 2018-03-08 LAB — PREPARE RBC (CROSSMATCH)

## 2018-03-08 SURGERY — ESOPHAGOGASTRODUODENOSCOPY (EGD) WITH PROPOFOL
Anesthesia: Monitor Anesthesia Care

## 2018-03-08 MED ORDER — ONDANSETRON HCL 4 MG PO TABS: 4.0000 mg | ORAL_TABLET | Freq: Four times a day (QID) | ORAL | Status: DC | PRN

## 2018-03-08 MED ORDER — SODIUM CHLORIDE 0.9% IV SOLUTION
Freq: Once | INTRAVENOUS | Status: AC
  Administered 2018-03-08: 22:00:00 via INTRAVENOUS

## 2018-03-08 MED ORDER — ONDANSETRON HCL 4 MG/2ML IJ SOLN: 4.0000 mg | Freq: Four times a day (QID) | INTRAMUSCULAR | Status: DC | PRN

## 2018-03-08 MED ORDER — SODIUM CHLORIDE 0.9 % IV SOLN
8.0000 mg/h | INTRAVENOUS | Status: DC
  Administered 2018-03-08: 8 mg/h via INTRAVENOUS
  Filled 2018-03-08 (×2): qty 80

## 2018-03-08 MED ORDER — SODIUM CHLORIDE 0.9 % IV SOLN
INTRAVENOUS | Status: DC
  Administered 2018-03-08 (×2): via INTRAVENOUS

## 2018-03-08 MED ORDER — IOPAMIDOL (ISOVUE-370) INJECTION 76%
INTRAVENOUS | Status: AC
  Filled 2018-03-08: qty 100

## 2018-03-08 MED ORDER — SODIUM CHLORIDE 0.9 % IV BOLUS
1000.0000 mL | Freq: Once | INTRAVENOUS | Status: AC
  Administered 2018-03-08: 1000 mL via INTRAVENOUS

## 2018-03-08 MED ORDER — PANTOPRAZOLE SODIUM 40 MG IV SOLR
40.0000 mg | Freq: Two times a day (BID) | INTRAVENOUS | Status: DC
  Administered 2018-03-08 – 2018-03-09 (×3): 40 mg via INTRAVENOUS
  Filled 2018-03-08 (×7): qty 40

## 2018-03-08 MED ORDER — ONDANSETRON HCL 4 MG/2ML IJ SOLN
4.0000 mg | Freq: Once | INTRAMUSCULAR | Status: AC
Start: 2018-03-08 — End: 2018-03-08
  Administered 2018-03-08: 4 mg via INTRAVENOUS
  Filled 2018-03-08: qty 2

## 2018-03-08 MED ORDER — SODIUM CHLORIDE 0.9% FLUSH
3.0000 mL | Freq: Two times a day (BID) | INTRAVENOUS | Status: DC
  Administered 2018-03-08 – 2018-03-09 (×4): 3 mL via INTRAVENOUS

## 2018-03-08 MED ORDER — LORAZEPAM 0.5 MG PO TABS
0.5000 mg | ORAL_TABLET | Freq: Every day | ORAL | Status: DC | PRN
  Administered 2018-03-08 – 2018-03-09 (×2): 0.5 mg via ORAL
  Filled 2018-03-08 (×2): qty 1

## 2018-03-08 MED ORDER — SODIUM CHLORIDE 0.9% IV SOLUTION: Freq: Once | INTRAVENOUS | Status: DC

## 2018-03-08 MED ORDER — PROPOFOL 500 MG/50ML IV EMUL
INTRAVENOUS | Status: DC | PRN
  Administered 2018-03-08: 75 ug/kg/min via INTRAVENOUS

## 2018-03-08 MED ORDER — PANTOPRAZOLE SODIUM 40 MG PO TBEC: 40.0000 mg | DELAYED_RELEASE_TABLET | Freq: Two times a day (BID) | ORAL | Status: DC

## 2018-03-08 MED ORDER — LIDOCAINE HCL (CARDIAC) PF 100 MG/5ML IV SOSY
PREFILLED_SYRINGE | INTRAVENOUS | Status: DC | PRN
  Administered 2018-03-08: 50 mg via INTRAVENOUS

## 2018-03-08 MED ORDER — PRAVASTATIN SODIUM 40 MG PO TABS
40.0000 mg | ORAL_TABLET | Freq: Every day | ORAL | Status: DC
  Administered 2018-03-08 – 2018-03-09 (×2): 40 mg via ORAL
  Filled 2018-03-08 (×2): qty 1

## 2018-03-08 MED ORDER — PANTOPRAZOLE SODIUM 40 MG IV SOLR: 40.0000 mg | Freq: Two times a day (BID) | INTRAVENOUS | Status: DC

## 2018-03-08 MED ORDER — SODIUM CHLORIDE 0.9 % IV SOLN
80.0000 mg | Freq: Once | INTRAVENOUS | Status: AC
  Administered 2018-03-08: 07:00:00 80 mg via INTRAVENOUS
  Filled 2018-03-08: qty 80

## 2018-03-08 MED ORDER — ACETAMINOPHEN 650 MG RE SUPP: 650.0000 mg | Freq: Four times a day (QID) | RECTAL | Status: DC | PRN

## 2018-03-08 MED ORDER — VENLAFAXINE HCL ER 75 MG PO CP24
150.0000 mg | ORAL_CAPSULE | Freq: Every day | ORAL | Status: DC
  Administered 2018-03-09 – 2018-03-10 (×2): 150 mg via ORAL
  Filled 2018-03-08 (×2): qty 2

## 2018-03-08 MED ORDER — ACETAMINOPHEN 325 MG PO TABS
650.0000 mg | ORAL_TABLET | Freq: Four times a day (QID) | ORAL | Status: DC | PRN
  Administered 2018-03-08 – 2018-03-09 (×3): 650 mg via ORAL
  Filled 2018-03-08 (×3): qty 2

## 2018-03-08 MED ORDER — LORAZEPAM 2 MG/ML IJ SOLN: 0.5000 mg | Freq: Four times a day (QID) | INTRAMUSCULAR | Status: DC | PRN

## 2018-03-08 SURGICAL SUPPLY — 14 items

## 2018-03-08 NOTE — Transfer of Care (Signed)
Immediate Anesthesia Transfer of Care Note  Patient: Phyllis [name]  Procedure(s) Performed: ESOPHAGOGASTRODUODENOSCOPY (EGD) WITH PROPOFOL (N/A )  Patient Location: Endo   Anesthesia Type:MAC  Level of Consciousness: awake, alert , oriented and patient cooperative  Airway & Oxygen Therapy: Patient Spontanous Breathing and Patient connected to nasal cannula oxygen  Post-op Assessment: Report given to RN and Post -op Vital signs reviewed and stable  Post vital signs: Reviewed and stable  Last Vitals:  Vitals Value Taken Time  BP 132/56 03/08/2018 10:38 AM  Temp 37 C 03/08/2018 10:36 AM  Pulse 101 03/08/2018 10:41 AM  Resp 32 03/08/2018 10:41 AM  SpO2 96 % 03/08/2018 10:41 AM  Vitals shown include unvalidated device data.  Last Pain:  Vitals:   03/08/18 1036  TempSrc: Oral  PainSc: 0-No pain         Complications: No apparent anesthesia complications

## 2018-03-08 NOTE — Anesthesia Postprocedure Evaluation (Signed)
Anesthesia Post Note  Patient: Phyllis [name]  Procedure(s) Performed: ESOPHAGOGASTRODUODENOSCOPY (EGD) WITH PROPOFOL (N/A )     Patient location during evaluation: PACU Anesthesia Type: MAC Level of consciousness: awake and alert Pain management: pain level controlled Vital Signs Assessment: post-procedure vital signs reviewed and stable Respiratory status: spontaneous breathing, nonlabored ventilation, respiratory function stable and patient connected to nasal cannula oxygen Cardiovascular status: stable and blood pressure returned to baseline Postop Assessment: no apparent nausea or vomiting Anesthetic complications: no    Last Vitals:  Vitals:   03/08/18 1045 03/08/18 1050  BP: (!) 114/58 (!) 141/72  Pulse: 99 (!) 110  Resp: (!) 21 (!) 34  Temp:    SpO2: 96% 97%    Last Pain:  Vitals:   03/08/18 1050  TempSrc:   PainSc: 0-No pain                 Tiajuana Amass

## 2018-03-08 NOTE — H&P (Signed)
History and Physical    Phyllis [name]:[id number] DOB: 1940/08/24 DOA: 03/08/2018  PCP: [name], MD   Patient coming from: Home   Chief Complaint: Indigestion, nausea, lightheadedness, diaphoresis  HPI: Phyllis [name] is a 77 y.o. female with medical history significant for hypertension, anxiety, headaches, and indigestion, now presenting to the emergency department for evaluation of indigestion, nausea, lightheadedness, and diaphoresis.  Patient reports that she been in her usual state of health when she went to bed last night, but woke early this morning with indigestion.  She took 1 Tums with improvement in her symptoms, but developed lightheadedness, nausea, and diaphoresis.  EMS was called and she was found to have blood pressure 80/60.  She was given an IV fluid bolus and transported to the hospital.  She denies experiencing these symptoms previously, denies vomiting, has not noted any blood in her stool or melena, denies alcohol use, reports occasional use of aspirin and NSAID for headaches, and was previously on omeprazole but not in over a year.  She denies chest pain, fevers, chills, or cough.   ED Course: Upon arrival to the ED, patient is found to be afebrile, saturating well on room air, tachycardic in the 110s, and blood pressure 120/70.  EKG features sinus tachycardia with nonspecific T wave abnormality and no prior available.  Chemistry panel is notable for a BUN of 47, up from 15 in June.  CBC features a hemoglobin of 9.2, down from 12.8 in June.  Troponin is negative.  Fecal occult blood testing is positive.  Patient was given a liter of normal saline, 80 mg IV Protonix, started on Protonix infusion, and typed and screened.  Plan to discuss with GI and admit to stepdown unit for ongoing evaluation and management of suspected acute upper GI bleed with symptomatic anemia.  Review of Systems:  All other systems reviewed and apart from HPI, are negative.  Past Medical  History:  Diagnosis Date  . Abdominal pain, unspecified site   . Acute sinusitis, unspecified   . Contact dermatitis and other eczema due to plants (except food)   . Cough   . Cramp of limb   . Depression   . Depressive disorder, not elsewhere classified   . Essential hypertension, benign   . Headache(784.0)   . High cholesterol   . Hypertension   . Lumbago   . Migraine   . Observation for suspected cardiovascular disease   . Osteoporosis, unspecified   . Other and unspecified hyperlipidemia   . Rash and other nonspecific skin eruption   . Rectocele   . Reflux esophagitis   . Unspecified constipation     Past Surgical History:  Procedure Laterality Date  . APPENDECTOMY    . BREAST BIOPSY     Percutaneous needle core     reports that she quit smoking about 32 years ago. Her smoking use included cigarettes. She has a 24.00 pack-year smoking history. She has never used smokeless tobacco. She reports that she does not drink alcohol or use drugs.  Allergies  Allergen Reactions  . Azithromycin     Developed a rash right after she quit taking it    Family History  Problem Relation Age of Onset  . Hypertension Unknown   . Coronary artery disease Unknown   . Heart disease Mother   . ADD / ADHD Son      Prior to Admission medications   Medication Sig Start Date End Date Taking? Authorizing Provider  alendronate (  FOSAMAX) 70 MG tablet Take 70 mg by mouth every Saturday. 01/12/18  Yes [provider]  butalbital-acetaminophen-caffeine (ESGIC) 50-325-40 MG tablet Take 1-2 tablets by mouth every 6 (six) hours as needed for headache.   Yes [provider]  hydrOXYzine (ATARAX/VISTARIL) 10 MG tablet Take 10 mg by mouth 3 (three) times daily as needed for itching.    Yes [provider]  LORazepam (ATIVAN) 0.5 MG tablet Take 0.5-1 mg by mouth daily as needed for anxiety.   Yes [provider]  losartan (COZAAR) 50 MG tablet Take 50 mg by mouth  daily.   Yes [provider]  Melatonin 5 MG TABS Take 5 mg by mouth at bedtime.   Yes [provider]  meloxicam (MOBIC) 7.5 MG tablet Take 7.5 mg by mouth daily.   Yes [provider]  Multiple Vitamin (MULTIVITAMIN) tablet Take 1 tablet by mouth daily.   Yes [provider]  pravastatin (PRAVACHOL) 40 MG tablet Take 40 mg by mouth daily.   Yes [provider]  venlafaxine XR (EFFEXOR-XR) 150 MG 24 hr capsule Take 150 mg by mouth daily with breakfast.   Yes [provider]  vitamin B-12 (CYANOCOBALAMIN) 1000 MCG tablet Take 1,000 mcg by mouth daily.   Yes [provider]  clotrimazole-betamethasone (LOTRISONE) cream Apply 1 application topically daily as needed.    [provider]    Physical Exam: Vitals:   03/08/18 0504 03/08/18 0505 03/08/18 0506 03/08/18 0530  BP: 130/78 138/78 (!) 147/91 119/74  Pulse: (!) 102 (!) 106 (!) 117 (!) 103  Resp: 16 20 19 19   Temp:      TempSrc:      SpO2: 100% 94% 100% 98%  Weight:      Height:         Constitutional: NAD, anxious  Eyes: PERTLA, lids and conjunctivae normal ENMT: Mucous membranes are moist. Posterior pharynx clear of any exudate or lesions.   Neck: normal, supple, no masses, no thyromegaly Respiratory: clear to auscultation bilaterally, no wheezing, no crackles. Normal respiratory effort.   Cardiovascular: Rate ~110 and regular. No extremity edema.  Abdomen: No distension, soft, non-tender. Bowel sounds active.  Musculoskeletal: no clubbing / cyanosis. No joint deformity upper and lower extremities.   Skin: no significant rashes, lesions, ulcers. Warm, dry, well-perfused. Neurologic: CN 2-12 grossly intact. Sensation intact. Strength 5/5 in all 4 limbs.  Psychiatric: Alert and oriented x 3. Anxious. Very pleasant.    Labs on Admission: I have personally reviewed following labs and imaging studies  CBC: Recent Labs  Lab 03/08/18 0458  WBC 10.3    NEUTROABS 8.2*  HGB 9.2*  8.8*  HCT 29.2*  26.0*  MCV 100.7*  PLT 213   Basic Metabolic Panel: Recent Labs  Lab 03/08/18 0458  NA 141  K 4.3  CL 108  GLUCOSE 104*  BUN 47*  CREATININE 0.80   GFR: Estimated Creatinine Clearance: 51.1 mL/min (by C-G formula based on SCr of 0.8 mg/dL). Liver Function Tests: No results for input(s): AST, ALT, ALKPHOS, BILITOT, PROT, ALBUMIN in the last 168 hours. No results for input(s): LIPASE, AMYLASE in the last 168 hours. No results for input(s): AMMONIA in the last 168 hours. Coagulation Profile: No results for input(s): INR, PROTIME in the last 168 hours. Cardiac Enzymes: No results for input(s): CKTOTAL, CKMB, CKMBINDEX, TROPONINI in the last 168 hours. BNP (last 3 results) No results for input(s): PROBNP in the last 8760 hours. HbA1C: No results for  input(s): HGBA1C in the last 72 hours. CBG: No results for input(s): GLUCAP in the last 168 hours. Lipid Profile: No results for input(s): CHOL, HDL, LDLCALC, TRIG, CHOLHDL, LDLDIRECT in the last 72 hours. Thyroid Function Tests: No results for input(s): TSH, T4TOTAL, FREET4, T3FREE, THYROIDAB in the last 72 hours. Anemia Panel: No results for input(s): VITAMINB12, FOLATE, FERRITIN, TIBC, IRON, RETICCTPCT in the last 72 hours. Urine analysis:    Component Value Date/Time   COLORURINE YELLOW 03/08/2018 0514   APPEARANCEUR CLEAR 03/08/2018 0514   LABSPEC 1.021 03/08/2018 0514   PHURINE 5.0 03/08/2018 0514   GLUCOSEU NEGATIVE 03/08/2018 0514   HGBUR NEGATIVE 03/08/2018 0514   BILIRUBINUR NEGATIVE 03/08/2018 0514   KETONESUR 20 (A) 03/08/2018 0514   PROTEINUR NEGATIVE 03/08/2018 0514   NITRITE NEGATIVE 03/08/2018 0514   LEUKOCYTESUR NEGATIVE 03/08/2018 0514   Sepsis Labs: @LABRCNTIP (procalcitonin:4,lacticidven:4) )No results found for this or any previous visit (from the past 240 hour(s)).   Radiological Exams on Admission: Dg Chest 2 View  Result Date: 03/08/2018 CLINICAL  DATA:  Shortness of breath. EXAM: CHEST - 2 VIEW COMPARISON:  None. FINDINGS: The cardiomediastinal contours are normal. Aortic atherosclerosis. There is biapical pleuroparenchymal scarring. Pulmonary vasculature is normal. No consolidation, pleural effusion, or pneumothorax. No acute osseous abnormalities are seen. IMPRESSION: No acute finding. Electronically Signed   By: Narda RutherfordMelanie  Sanford M.D.   On: 03/08/2018 05:49    EKG: Independently reviewed. Sinus tachycardia, non-specific lateral T-wave abnormality. No priors available.   Assessment/Plan   1. Acute upper GI bleeding; symptomatic anemia - Presents with acute-onset of indigestion and nausea that woke her from sleep, hypotensive with EMS, found to have BUN 47 (15 in June) and Hgb 9.2 (12.8 in June) with dark FOBT+ stool  - She uses ASA and Mobic for headaches, denies EtOH use, has documented hx of reflux esophagitis but no EGD report on file, previously on omeprazole but not in recent years  - BP has improved with fluid-resuscitation - She was given a liter of NS and 80 mg IV Protonix in ED  - GI is consulting and much appreciated  - Continue IV Protonix infusion, transfuse one unit now, keep NPO, continue IVF, check post-transfusion CBC   2. Hypotension; history of HTN  - BP 80/60 with EMS, improved with bolus prior to arrival in ED  - Fluid-resuscitation continued in ED with 1 liter NS  - Plan to transfuse 1 unit now, continue IVF hydration, monitor in SDU, hold antihypertensives    3. Anxiety  - Patient is quite anxious in ED  - Managed with venlafaxine and prn Ativan at home - Use low-dose IV Ativan as needed for now    DVT prophylaxis: SCD's Code Status: Full Family Communication: Discussed with patient   Consults called: GI  Admission status: Inpatient     Briscoe Deutscherimothy S Opyd, MD [facility] Pager [phone]  If 7PM-7AM, please contact night-coverage [email or web address] Password [email or web address]  03/08/2018, 5:55 AM

## 2018-03-08 NOTE — Consult Note (Signed)
[facility] Consultation Note  Referring Provider: Dr. Antionette Char Primary Care Physician:  [name], MD  Reason for Consultation:  Anemia, GI bleed  HPI: Phyllis [name] is a 77 y.o. female presenting with near-syncope and weakness and diaphoresis.  Started abruptly early this morning.  Hemoccult positive stools, but no overt bleeding.  Chronic GERD; no prior endoscopy; colonoscopy about 10 years ago (no records).  Chronic constipation, relieved with fiber in diet; no abdominal pain, change in bowel habits, melena, hematochezia, hematemesis, weight loss.  Takes NSAIDs regularly for arthritis.   Past Medical History:  Diagnosis Date  . Abdominal pain, unspecified site   . Acute sinusitis, unspecified   . Contact dermatitis and other eczema due to plants (except food)   . Cough   . Cramp of limb   . Depression   . Depressive disorder, not elsewhere classified   . Essential hypertension, benign   . Headache(784.0)   . High cholesterol   . Hypertension   . Lumbago   . Migraine   . Observation for suspected cardiovascular disease   . Osteoporosis, unspecified   . Other and unspecified hyperlipidemia   . Rash and other nonspecific skin eruption   . Rectocele   . Reflux esophagitis   . Unspecified constipation     Past Surgical History:  Procedure Laterality Date  . APPENDECTOMY    . BREAST BIOPSY     Percutaneous needle core    Prior to Admission medications   Medication Sig Start Date End Date Taking? Authorizing Provider  alendronate (FOSAMAX) 70 MG tablet Take 70 mg by mouth every Saturday. 01/12/18  Yes [provider]  butalbital-acetaminophen-caffeine (ESGIC) 50-325-40 MG tablet Take 1-2 tablets by mouth every 6 (six) hours as needed for headache.   Yes [provider]  hydrOXYzine (ATARAX/VISTARIL) 10 MG tablet Take 10 mg by mouth 3 (three) times daily as needed for itching.    Yes [provider]  LORazepam (ATIVAN) 0.5 MG tablet  Take 0.5-1 mg by mouth daily as needed for anxiety.   Yes [provider]  losartan (COZAAR) 50 MG tablet Take 50 mg by mouth daily.   Yes [provider]  Melatonin 5 MG TABS Take 5 mg by mouth at bedtime.   Yes [provider]  meloxicam (MOBIC) 7.5 MG tablet Take 7.5 mg by mouth daily.   Yes [provider]  Multiple Vitamin (MULTIVITAMIN) tablet Take 1 tablet by mouth daily.   Yes [provider]  pravastatin (PRAVACHOL) 40 MG tablet Take 40 mg by mouth daily.   Yes [provider]  venlafaxine XR (EFFEXOR-XR) 150 MG 24 hr capsule Take 150 mg by mouth daily with breakfast.   Yes [provider]  vitamin B-12 (CYANOCOBALAMIN) 1000 MCG tablet Take 1,000 mcg by mouth daily.   Yes [provider]  clotrimazole-betamethasone (LOTRISONE) cream Apply 1 application topically daily as needed.    [provider]    Current Facility-Administered Medications  Medication Dose Route Frequency Provider Last Rate Last Dose  . [MAR Hold] 0.9 %  sodium chloride infusion (Manually program via Guardrails IV Fluids)   Intravenous Once Opyd, Lavone Neri, MD      . Mitzi Hansen Hold] acetaminophen (TYLENOL) tablet 650 mg  650 mg Oral Q6H PRN Opyd, Lavone Neri, MD       Or  . Mitzi Hansen Hold] acetaminophen (TYLENOL) suppository 650 mg  650 mg Rectal Q6H PRN Opyd, Lavone Neri, MD      .  iopamidol (ISOVUE-370) 76 % injection           . [MAR Hold] LORazepam (ATIVAN) tablet 0.5 mg  0.5 mg Oral Daily PRN Arrien, York RamMauricio Daniel, MD      . Mitzi Hansen[MAR Hold] ondansetron [facility](ZOFRAN) tablet 4 mg  4 mg Oral Q6H PRN Opyd, Lavone Neriimothy S, MD       Or  . Mitzi Hansen[MAR Hold] ondansetron (ZOFRAN) injection 4 mg  4 mg Intravenous Q6H PRN Opyd, Lavone Neriimothy S, MD      . Mitzi Hansen[MAR Hold] pantoprazole (PROTONIX) EC tablet 40 mg  40 mg Oral BID Arrien, York RamMauricio Daniel, MD      . Mitzi Hansen[MAR Hold] pravastatin (PRAVACHOL) tablet 40 mg  40 mg Oral q1800 Arrien, York RamMauricio Daniel, MD      . Mitzi Hansen[MAR Hold] sodium chloride  flush (NS) 0.9 % injection 3 mL  3 mL Intravenous Q12H Opyd, Lavone Neriimothy S, MD   3 mL at 03/08/18 0942  . [MAR Hold] venlafaxine XR (EFFEXOR-XR) 24 hr capsule 150 mg  150 mg Oral Q breakfast Arrien, York RamMauricio Daniel, MD        Allergies as of 03/08/2018 - Review Complete 03/08/2018  Allergen Reaction Noted  . Azithromycin  08/15/2015    Family History  Problem Relation Age of Onset  . Hypertension Unknown   . Coronary artery disease Unknown   . Heart disease Mother   . ADD / ADHD Son     Social History   Socioeconomic History  . Marital status: Widowed    Spouse name: Zacarias PontesJohnny Gassert  . Number of children: 1  . Years of education: 6512  . Highest education level: Not on file  Occupational History  . Occupation: retired  Engineer, productionocial Needs  . Financial resource strain: Not on file  . Food insecurity:    Worry: Not on file    Inability: Not on file  . Transportation needs:    Medical: Not on file    Non-medical: Not on file  Tobacco Use  . Smoking status: Former Smoker    Packs/day: 2.00    Years: 12.00    Pack years: 24.00    Types: Cigarettes    Last attempt to quit: 06/24/1985    Years since quitting: 32.7  . Smokeless tobacco: Never Used  Substance and Sexual Activity  . Alcohol use: No  . Drug use: No  . Sexual activity: Not on file  Lifestyle  . Physical activity:    Days per week: Not on file    Minutes per session: Not on file  . Stress: Not on file  Relationships  . Social connections:    Talks on phone: Not on file    Gets together: Not on file    Attends religious service: Not on file    Active member of club or organization: Not on file    Attends meetings of clubs or organizations: Not on file    Relationship status: Not on file  . Intimate partner violence:    Fear of current or ex partner: Not on file    Emotionally abused: Not on file    Physically abused: Not on file    Forced sexual activity: Not on file  Other Topics Concern  . Not on file  Social  History Narrative   Lives at home alone   Right handed   Drinks 1 cup of caffeinated coffee & about 6 cups of decaf drinks daily    Review of Systems: As per HPI, all others negative  Physical Exam: Vital signs in last 24 hours: Temp:  [97.9 F (36.6 C)-98.7 F (37.1 C)] 98.5 F (36.9 C) (09/15 0950) Pulse Rate:  [97-117] 103 (09/15 0831) Resp:  [13-23] 19 (09/15 0950) BP: (99-148)/(70-104) 106/78 (09/15 0950) SpO2:  [94 %-100 %] 98 % (09/15 0950) Weight:  [55.8 kg] 55.8 kg (09/15 0950)   General:   Alert, thin, well-nourished, pleasant and cooperative in NAD Head:  Normocephalic and atraumatic. Eyes:  Sclera clear, no icterus.   Conjunctiva pale Ears:  Normal auditory acuity. Nose:  No deformity, discharge,  or lesions. Mouth:  No deformity or lesions.  Oropharynx pale and dry Neck:  Supple; no masses or thyromegaly. Lungs:  Clear throughout to auscultation.   No wheezes, crackles, or rhonchi. No acute distress. Heart:  Regular rate and rhythm; no murmurs, clicks, rubs,  or gallops. Abdomen:  Soft, nontender and nondistended. No masses, hepatosplenomegaly or hernias noted. Normal bowel sounds, without guarding, and without rebound.     Msk:  Symmetrical without gross deformities. Normal posture. Pulses:  Normal pulses noted. Extremities:  Without clubbing or edema. Neurologic:  Alert and  oriented x4; diffusely weak, otherwise grossly normal neurologically. Skin:  Intact without significant lesions or rashes. Psych:  Alert and cooperative. Normal mood and affect.   Lab Results: Recent Labs    03/08/18 0458  WBC 10.3  HGB 9.2*  8.8*  HCT 29.2*  26.0*  PLT 213   BMET Recent Labs    03/08/18 0458 03/08/18 0605  NA 141 142  K 4.3 4.2  CL 108 106  CO2  --  25  GLUCOSE 104* 105*  BUN 47* 48*  CREATININE 0.80 0.73  CALCIUM  --  9.3   LFT Recent Labs    03/08/18 0605  PROT 5.0*  ALBUMIN 3.2*  AST 23  ALT <5  ALKPHOS 36*  BILITOT 0.2*   PT/INR No  results for input(s): LABPROT, INR in the last 72 hours.  Studies/Results: Dg Chest 2 View  Result Date: 03/08/2018 CLINICAL DATA:  Shortness of breath. EXAM: CHEST - 2 VIEW COMPARISON:  None. FINDINGS: The cardiomediastinal contours are normal. Aortic atherosclerosis. There is biapical pleuroparenchymal scarring. Pulmonary vasculature is normal. No consolidation, pleural effusion, or pneumothorax. No acute osseous abnormalities are seen. IMPRESSION: No acute finding. Electronically Signed   By: Narda Rutherford M.D.   On: 03/08/2018 05:49    Impression:  1.  Anemia, symptomatic (hypotensive, responded to fluids; near-syncope). 2.  Hemoccult positive stools. 3.  NSAID use.  Plan:  1.  PPI. 2.  No NSAIDs/ASA. 3.  Endoscopy today. 4.  Risks (bleeding, infection, bowel perforation that could require surgery, sedation-related changes in cardiopulmonary systems), benefits (identification and possible treatment of source of symptoms, exclusion of certain causes of symptoms), and alternatives (watchful waiting, radiographic imaging studies, empiric medical treatment) of upper endoscopy (EGD) were explained to patient/family in detail and patient wishes to proceed. 5.  If EGD negative, will need colonoscopy. 6.  [facility] will follow.   LOS: 0 days   Sekou Zuckerman M  03/08/2018, 10:06 AM  Cell [phone] If no answer or after 5 PM call [phone]

## 2018-03-08 NOTE — ED Triage Notes (Signed)
BIB [facility] from home. Pt states she woke up with indigestion and took 1 Tums with relief. Afterwards pt became diaphoretic and short of breath. BP: 80/60 with EMS initially. Received bolus of NS and systolic 122 on arrival.

## 2018-03-08 NOTE — Progress Notes (Signed)
Repeat Hb down to 7.1 with Hct down to 22.4. Will transfuse one unit PRBC and will continue to follow cell count.

## 2018-03-08 NOTE — Op Note (Signed)
[facility] Patient Name: Phyllis [name] SeenLynda Geier Procedure Date : 03/08/2018 MRN: [id number] Attending MD: Willis ModenaWilliam Kaseem Vastine , MD Date of Birth: 09/02/1940 CSN: [id number] Age: [id number] Admit Type: Inpatient Procedure:                Upper GI endoscopy Indications:              Acute post hemorrhagic anemia, Heme positive stool Providers:                Willis ModenaWilliam Ceferino Lang, MD, Tomma RakersJennifer Kappus, RN, Harrington ChallengerHope                            Parker, Technician, Kelly SplinterJoshua Hawkins, CRNA Referring MD:             [facility] Medicines:                Monitored Anesthesia Care Complications:            No immediate complications. Estimated Blood Loss:     Estimated blood loss: none. Procedure:                Pre-Anesthesia Assessment:                           - Prior to the procedure, a History and Physical                            was performed, and patient medications and                            allergies were reviewed. The patient's tolerance of                            previous anesthesia was also reviewed. The risks                            and benefits of the procedure and the sedation                            options and risks were discussed with the patient.                            All questions were answered, and informed consent                            was obtained. Prior Anticoagulants: The patient has                            taken previous NSAID medication. ASA Grade                            Assessment: II - A patient with mild systemic                            disease. After reviewing the risks and benefits,  the patient was deemed in satisfactory condition to                            undergo the procedure.                           After obtaining informed consent, the endoscope was                            passed under direct vision. Throughout the                            procedure, the patient's blood pressure, pulse, and                   oxygen saturations were monitored continuously. The                            [id number]-H190 ([id number]) Olympus Adult EGD was introduced                            through the mouth, and advanced to the second part                            of duodenum. The upper GI endoscopy was                            accomplished without difficulty. The patient                            tolerated the procedure well. Scope In: Scope Out: Findings:      A 2 cm hiatal hernia was present.      LA Grade A (one or more mucosal breaks less than 5 mm, not extending       between tops of 2 mucosal folds) esophagitis was found.      The exam of the esophagus was otherwise normal.      Hematin (altered blood/coffee-ground-like material) and some clots were       found in the gastric fundus and in the gastric body; with time, most of       the blood clots and debris was able to be suctioned out, and fairly good       views of the gastric mucosa were obtained.      Two non-bleeding cratered gastric ulcers with no stigmata of bleeding       were found in the gastric antrum. The largest lesion was 8 mm in largest       dimension.      The exam of the stomach was otherwise normal.      The duodenal bulb, first portion of the duodenum and second portion of       the duodenum were normal. Impression:               - 2 cm hiatal hernia.                           - LA Grade A esophagitis.                           -  Hematin (altered blood/coffee-ground-like                            material) in the gastric fundus and in the gastric                            body.                           - Non-bleeding gastric ulcers with no stigmata of                            bleeding; low chance of rebleeding. Ulcers are the                            likely source of symptoms (anemia, hypotension,                            hemoccult positive stool), but odd patient didn't                            have  hematemesis or overt blood in stool (and                            wouldn't be surprised if she had a few melenic                            bowel movements subsequent to this procedure).                           - Normal duodenal bulb, first portion of the                            duodenum and second portion of the duodenum. Moderate Sedation:      None Recommendation:           - Clear liquid diet today.                           - Use Protonix (pantoprazole) 40 mg IV BID until                            further notice.                           - Perform an H. pylori serology today.                           - No aspirin, ibuprofen, naproxen, or other                            non-steroidal anti-inflammatory drugs until further                            notice.                           -  [facility] will follow. Suspect patient can be                            discharged home in 1-2 days. Procedure Code(s):        --- Professional ---                           [phone], Esophagogastroduodenoscopy, flexible,                            transoral; diagnostic, including collection of                            specimen(s) by brushing or washing, when performed                            (separate procedure) Diagnosis Code(s):        --- Professional ---                           K44.9, Diaphragmatic hernia without obstruction or                            gangrene                           K20.9, Esophagitis, unspecified                           K92.2, Gastrointestinal hemorrhage, unspecified                           K25.9, Gastric ulcer, unspecified as acute or                            chronic, without hemorrhage or perforation                           D62, Acute posthemorrhagic anemia                           R19.5, Other fecal abnormalities CPT copyright 2017 [employer]. All rights reserved. The codes documented in this report are preliminary and upon coder  review may  be revised to meet current compliance requirements. Willis Modena, MD 03/08/2018 10:42:33 AM This report has been signed electronically. Number of Addenda: 0

## 2018-03-08 NOTE — Interval H&P Note (Signed)
History and Physical Interval Note:  03/08/2018 10:06 AM  Phyllis EvertsLynda K [name]  has presented today for surgery, with the diagnosis of GI bleed  The various methods of treatment have been discussed with the patient and family. After consideration of risks, benefits and other options for treatment, the patient has consented to  Procedure(s): ESOPHAGOGASTRODUODENOSCOPY (EGD) WITH PROPOFOL (N/A) as a surgical intervention .  The patient's history has been reviewed, patient examined, no change in status, stable for surgery.  I have reviewed the patient's chart and labs.  Questions were answered to the patient's satisfaction.     Freddy JakschUTLAW,Seline Enzor M

## 2018-03-08 NOTE — ED Provider Notes (Signed)
[facility] EMERGENCY DEPARTMENT Provider Note   CSN: [id number] Arrival date & time: 03/08/18  0440     History   Chief Complaint Chief Complaint  Patient presents with  . Hypotension    HPI OAKLIE DURRETT is a 77 y.o. female.  The history is provided by the patient.  Near Syncope  This is a new problem. The current episode started less than 1 hour ago. The problem occurs constantly. The problem has not changed since onset.Associated symptoms include shortness of breath. Pertinent negatives include no chest pain, no abdominal pain and no headaches. Associated symptoms comments: Indigestion and diaphoresis. Nothing aggravates the symptoms. Nothing relieves the symptoms. She has tried nothing for the symptoms. The treatment provided no relief.  Found to be hypotensive.  Diaphoretic with indigestion but no abdominal pain.    Past Medical History:  Diagnosis Date  . Abdominal pain, unspecified site   . Acute sinusitis, unspecified   . Contact dermatitis and other eczema due to plants (except food)   . Cough   . Cramp of limb   . Depression   . Depressive disorder, not elsewhere classified   . Essential hypertension, benign   . Headache(784.0)   . High cholesterol   . Hypertension   . Lumbago   . Migraine   . Observation for suspected cardiovascular disease   . Osteoporosis, unspecified   . Other and unspecified hyperlipidemia   . Rash and other nonspecific skin eruption   . Rectocele   . Reflux esophagitis   . Unspecified constipation     Patient Active Problem List   Diagnosis Date Noted  . Attention deficit disorder 01/06/2018  . Cognitive change 06/11/2017  . 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 APPENDECTOMY    . BREAST BIOPSY     Percutaneous needle core     OB History   None      Home Medications    Prior to Admission medications   Medication Sig Start Date End  Date Taking? Authorizing Provider  alendronate (FOSAMAX) 70 MG tablet Take 70 mg by mouth every Saturday. 01/12/18  Yes [provider]  butalbital-acetaminophen-caffeine (ESGIC) 50-325-40 MG tablet Take 1-2 tablets by mouth every 6 (six) hours as needed for headache.   Yes [provider]  hydrOXYzine (ATARAX/VISTARIL) 10 MG tablet Take 10 mg by mouth 3 (three) times daily as needed for itching.    Yes [provider]  LORazepam (ATIVAN) 0.5 MG tablet Take 0.5-1 mg by mouth daily as needed for anxiety.   Yes [provider]  losartan (COZAAR) 50 MG tablet Take 50 mg by mouth daily.   Yes [provider]  Melatonin 5 MG TABS Take 5 mg by mouth at bedtime.   Yes [provider]  meloxicam (MOBIC) 7.5 MG tablet Take 7.5 mg by mouth daily.   Yes [provider]  Multiple Vitamin (MULTIVITAMIN) tablet Take 1 tablet by mouth daily.   Yes [provider]  pravastatin (PRAVACHOL) 40 MG tablet Take 40 mg by mouth daily.   Yes [provider]  venlafaxine XR (EFFEXOR-XR) 150 MG 24 hr capsule Take 150 mg by mouth daily with breakfast.   Yes [provider]  vitamin B-12 (CYANOCOBALAMIN) 1000 MCG tablet Take 1,000 mcg by mouth daily.   Yes [provider]  clotrimazole-betamethasone (LOTRISONE) cream Apply 1 application topically daily as needed.    [provider]  Family History Family History  Problem Relation Age of Onset  . Hypertension Unknown   . Coronary artery disease Unknown   . Heart disease Mother   . ADD / ADHD Son     Social History Social History   Tobacco Use  . Smoking status: Former Smoker    Packs/day: 2.00    Years: 12.00    Pack years: 24.00    Types: Cigarettes    Last attempt to quit: 06/24/1985    Years since quitting: 32.7  . Smokeless tobacco: Never Used  Substance Use Topics  . Alcohol use: No  . Drug use: No     Allergies   Azithromycin   Review of  Systems Review of Systems  Constitutional: Positive for diaphoresis. Negative for appetite change and fever.  Respiratory: Positive for shortness of breath. Negative for cough and chest tightness.   Cardiovascular: Positive for near-syncope. Negative for chest pain, palpitations and leg swelling.  Gastrointestinal: Positive for nausea. Negative for abdominal pain.  Neurological: Negative for headaches.  Psychiatric/Behavioral: The patient is nervous/anxious.   All other systems reviewed and are negative.    Physical Exam Updated Vital Signs BP (!) 147/91   Pulse (!) 117   Temp 97.9 F (36.6 C) (Oral)   Resp 19   Ht 5\' 5"  (1.651 m)   Wt 55.8 kg   SpO2 100%   BMI 20.47 kg/m   Physical Exam  Constitutional: She appears well-developed and well-nourished.  HENT:  Head: Normocephalic and atraumatic.  Mouth/Throat: No oropharyngeal exudate.  Eyes: Pupils are equal, round, and reactive to light.  Neck: Normal range of motion. Neck supple.  Cardiovascular: Regular rhythm, normal heart sounds and intact distal pulses. Tachycardia present.  Pulmonary/Chest: Effort normal and breath sounds normal. No stridor. She has no wheezes. She has no rales.  Abdominal: Soft. Bowel sounds are normal. She exhibits no mass. There is no tenderness. There is no rebound and no guarding.  Genitourinary: Rectal exam shows guaiac positive stool.  Neurological: She is alert.  Skin: Capillary refill takes less than 2 seconds. She is diaphoretic. There is pallor.  Psychiatric: She has a normal mood and affect.     ED Treatments / Results  Labs (all labs ordered are listed, but only abnormal results are displayed) Results for orders placed or performed during the hospital encounter of 03/08/18  CBC with Differential  Result Value Ref Range   WBC 10.3 4.0 - 10.5 K/uL   RBC 2.90 (L) 3.87 - 5.11 MIL/uL   Hemoglobin 9.2 (L) 12.0 - 15.0 g/dL   HCT 40.929.2 (L) 81.136.0 - 91.446.0 %   MCV 100.7 (H) 78.0 - 100.0 fL    MCH 31.7 26.0 - 34.0 pg   MCHC 31.5 30.0 - 36.0 g/dL   RDW 78.212.7 95.611.5 - 21.315.5 %   Platelets 213 150 - 400 K/uL   Neutrophils Relative % 78 %   Neutro Abs 8.2 (H) 1.7 - 7.7 K/uL   Lymphocytes Relative 14 %   Lymphs Abs 1.4 0.7 - 4.0 K/uL   Monocytes Relative 5 %   Monocytes Absolute 0.6 0.1 - 1.0 K/uL   Eosinophils Relative 1 %   Eosinophils Absolute 0.1 0.0 - 0.7 K/uL   Basophils Relative 1 %   Basophils Absolute 0.1 0.0 - 0.1 K/uL   Immature Granulocytes 1 %   Abs Immature Granulocytes 0.1 0.0 - 0.1 K/uL  Urinalysis, Routine w reflex microscopic  Result Value Ref Range   Color, Urine YELLOW  YELLOW   APPearance CLEAR CLEAR   Specific Gravity, Urine 1.021 1.005 - 1.030   pH 5.0 5.0 - 8.0   Glucose, UA NEGATIVE NEGATIVE mg/dL   Hgb urine dipstick NEGATIVE NEGATIVE   Bilirubin Urine NEGATIVE NEGATIVE   Ketones, ur 20 (A) NEGATIVE mg/dL   Protein, ur NEGATIVE NEGATIVE mg/dL   Nitrite NEGATIVE NEGATIVE   Leukocytes, UA NEGATIVE NEGATIVE  D-dimer, quantitative (not at [facility])  Result Value Ref Range   D-Dimer, Quant 0.38 0.00 - 0.50 ug/mL-FEU  I-Stat Chem 8, ED  Result Value Ref Range   Sodium 141 135 - 145 mmol/L   Potassium 4.3 3.5 - 5.1 mmol/L   Chloride 108 98 - 111 mmol/L   BUN 47 (H) 8 - 23 mg/dL   Creatinine, Ser 4.09 0.44 - 1.00 mg/dL   Glucose, Bld 811 (H) 70 - 99 mg/dL   Calcium, Ion 9.14 7.82 - 1.40 mmol/L   TCO2 24 22 - 32 mmol/L   Hemoglobin 8.8 (L) 12.0 - 15.0 g/dL   HCT 95.6 (L) 21.3 - 08.6 %  I-Stat Troponin, ED (not at [facility])  Result Value Ref Range   Troponin i, poc 0.00 0.00 - 0.08 ng/mL   Comment 3          POC occult blood, ED Provider will collect  Result Value Ref Range   Fecal Occult Bld POSITIVE (A) NEGATIVE   No results found.  None  Radiology No results found.  Procedures Procedures (including critical care time)  Medications Ordered in ED Medications  sodium chloride 0.9 % bolus 1,000 mL (has no administration in time range)    ondansetron (ZOFRAN) injection 4 mg (has no administration in time range)  pantoprazole (PROTONIX) 80 mg in sodium chloride 0.9 % 100 mL IVPB (has no administration in time range)  pantoprazole (PROTONIX) 80 mg in sodium chloride 0.9 % 250 mL (0.32 mg/mL) infusion (has no administration in time range)  pantoprazole (PROTONIX) injection 40 mg (has no administration in time range)     MDM Reviewed: nursing note and vitals Interpretation: labs, ECG and x-ray (low Hgb positive hemooccult  NACPD) Total time providing critical care: 75-105 minutes (protonix bolus and drip multiple boluses). This excludes time spent performing separately reportable procedures and services. Consults: admitting MD   Final Clinical Impressions(s) / ED Diagnoses   Final diagnoses:  Near syncope  Gastrointestinal hemorrhage associated with acute gastritis   Admit to hospital for GIB   Lexani Corona, MD 03/08/18 5784

## 2018-03-08 NOTE — Anesthesia Preprocedure Evaluation (Signed)
Anesthesia Evaluation  Patient identified by MRN, date of birth, ID band Patient awake    Reviewed: Allergy & Precautions, NPO status , Patient's Chart, lab work & pertinent test results  Airway Mallampati: II  TM Distance: >3 FB Neck ROM: Full    Dental   Pulmonary former smoker,    Pulmonary exam normal breath sounds clear to auscultation       Cardiovascular hypertension, Pt. on medications Normal cardiovascular exam Rhythm:Regular Rate:Normal     Neuro/Psych  Headaches, Depression    GI/Hepatic negative GI ROS, Neg liver ROS,   Endo/Other  negative endocrine ROS  Renal/GU negative Renal ROS     Musculoskeletal   Abdominal   Peds  Hematology  (+) anemia ,   Anesthesia Other Findings   Reproductive/Obstetrics                             Anesthesia Physical Anesthesia Plan  ASA: III  Anesthesia Plan: MAC   Post-op Pain Management:    Induction: Intravenous  PONV Risk Score and Plan: 2 and Propofol infusion, Ondansetron and Treatment may vary due to age or medical condition  Airway Management Planned: Natural Airway and Nasal Cannula  Additional Equipment:   Intra-op Plan:   Post-operative Plan:   Informed Consent: I have reviewed the patients History and Physical, chart, labs and discussed the procedure including the risks, benefits and alternatives for the proposed anesthesia with the patient or authorized representative who has indicated his/her understanding and acceptance.     Plan Discussed with: CRNA  Anesthesia Plan Comments:         Anesthesia Quick Evaluation

## 2018-03-08 NOTE — Progress Notes (Addendum)
PROGRESS NOTE    Phyllis [name]  ZOX:[id number] DOB: 05/03/1941 DOA: 03/08/2018 PCP: Barbie BannerWilson, Fred H, MD    Brief Narrative:  77 year old female who presented with diaphoresis, lightheadedness, nausea and indigestion.  She does have significant past medical history for hypertension, GERD and anxiety.  Patient woke up  early in the morning on the day of admission with dyspepsia, she took two antiacid tablets "tums", with persistent symptoms. Then she developed profuse diaphoresis and generalized weakness associated with lightheadedness, no syncope episode. Symptoms were severe enough that EMS was called and she was found hypotensive, 80/60.  Denied any melena, hematemesis or hematochezia. She is physically active, has chronic reflux disease, has Tums at the bedside, and recently stop taking proton pump inhibitor to prevent side effects.  After IV fluids her blood pressure was 120/70, heart rate 110, respiratory rate 20, oxygen saturation 94%.  She was anxious, moist mucous membranes, lungs were clear to auscultation bilaterally, heart S1-S2 present, tachycardic, abdomen with no distention, no tenderness or rebound, no lower extremity edema.  Sodium 142, potassium 4.8, chloride 106, bicarb 25, glucose 105, BUN 48, creatinine 0.73, white count 10.3, hemoglobin 9.2, hematocrit 29.2, platelets 213, d-dimer 0.38, urine analysis negative for infection. Fecal occult blood was positive.  Chest radiograph with signs of hyperinflation, no infiltrates.  EKG with sinus tachycardia, normal axis and normal intervals.  Patient was admitted to the hospital with a working diagnosis of hypotension, to rule out acute blood loss anemia due to upper GI bleed.  Assessment & Plan:   Principal Problem:   Acute upper GI bleeding Active Problems:   Essential hypertension, benign   Reflux esophagitis   Transient hypotension   Symptomatic anemia   1. Vasovagal reflex mediated hypotension. Patient had IV fluids  resuscitation, blood pressure this am is 137 systolic, negative orthostatics. Will continue blood pressure monitoring, will hold on antihypertensive agents. Hold on IV fluids. Out of bed and ambulate in the hallway. Continue telemetry monitoring.   2. GERD with positive fecal occult blood. Will continue proton pump inhibitors, hb this am at  9.2 with Hct at 29.2. GI has been consulted. Symptoms of diaphoresis and hypotension likely related to vasovagal reflex triggered by gastric pain. Hold meloxicam.   3. HTN. At home on losartan 50 mg daily, will continue to hold for now. Will continue blood pressure monitoring.  4. Dyslipidemia. Will continue pravastatin.   5. Anxiety. Will continue lorazepam, and venlafaxine.    DVT prophylaxis: scd   Code Status:  full Family Communication: No family at the bedside  Disposition Plan/ discharge barriers: pending GI evaluation    Consultants:   Gi   Procedures:     Antimicrobials:       Subjective: Patient is feeling better, no nausea or vomiting, no chest pain, no melena, hematochezia or hematemesis.   Objective: Vitals:   03/08/18 0645 03/08/18 0726 03/08/18 0730 03/08/18 0734  BP: (!) 148/91 115/78 128/80 128/80  Pulse: (!) 107 (!) 112 (!) 106 (!) 104  Resp: (!) 21 15 16 20   Temp:      TempSrc:      SpO2: 99% 100% 99% 100%  Weight:      Height:        Intake/Output Summary (Last 24 hours) at 03/08/2018 0815 Last data filed at 03/08/2018 0711 Gross per 24 hour  Intake 1100 ml  Output -  Net 1100 ml   Filed Weights   03/08/18 0457  Weight: 55.8 kg  Examination:   General: Not in pain or dyspnea  Neurology: Awake and alert, non focal  E ENT: no pallor, no icterus, oral mucosa moist Cardiovascular: No JVD. S1-S2 present, rhythmic, no gallops, rubs, or murmurs. No lower extremity edema. Pulmonary: positive breath sounds bilaterally, adequate air movement, no wheezing, rhonchi or rales. Gastrointestinal. Abdomen with  no organomegaly, non tender, no rebound or guarding Skin. No rashes Musculoskeletal: no joint deformities     Data Reviewed: I have personally reviewed following labs and imaging studies  CBC: Recent Labs  Lab 03/08/18 0458  WBC 10.3  NEUTROABS 8.2*  HGB 9.2*  8.8*  HCT 29.2*  26.0*  MCV 100.7*  PLT 213   Basic Metabolic Panel: Recent Labs  Lab 03/08/18 0458 03/08/18 0605  NA 141 142  K 4.3 4.2  CL 108 106  CO2  --  25  GLUCOSE 104* 105*  BUN 47* 48*  CREATININE 0.80 0.73  CALCIUM  --  9.3   GFR: Estimated Creatinine Clearance: 51.1 mL/min (by C-G formula based on SCr of 0.73 mg/dL). Liver Function Tests: Recent Labs  Lab 03/08/18 0605  AST 23  ALT <5  ALKPHOS 36*  BILITOT 0.2*  PROT 5.0*  ALBUMIN 3.2*   No results for input(s): LIPASE, AMYLASE in the last 168 hours. No results for input(s): AMMONIA in the last 168 hours. Coagulation Profile: No results for input(s): INR, PROTIME in the last 168 hours. Cardiac Enzymes: No results for input(s): CKTOTAL, CKMB, CKMBINDEX, TROPONINI in the last 168 hours. BNP (last 3 results) No results for input(s): PROBNP in the last 8760 hours. HbA1C: No results for input(s): HGBA1C in the last 72 hours. CBG: No results for input(s): GLUCAP in the last 168 hours. Lipid Profile: No results for input(s): CHOL, HDL, LDLCALC, TRIG, CHOLHDL, LDLDIRECT in the last 72 hours. Thyroid Function Tests: No results for input(s): TSH, T4TOTAL, FREET4, T3FREE, THYROIDAB in the last 72 hours. Anemia Panel: No results for input(s): VITAMINB12, FOLATE, FERRITIN, TIBC, IRON, RETICCTPCT in the last 72 hours.    Radiology Studies: I have reviewed all of the imaging during this hospital visit personally     Scheduled Meds: . sodium chloride   Intravenous Once  . iopamidol      . [START ON 03/11/2018] pantoprazole  40 mg Intravenous Q12H  . sodium chloride flush  3 mL Intravenous Q12H   Continuous Infusions: . sodium chloride  125 mL/hr at 03/08/18 0646  . pantoprozole (PROTONIX) infusion 8 mg/hr (03/08/18 0724)     LOS: 0 days        Mauricio Annett Gula, MD [facility] Pager [phone]

## 2018-03-08 NOTE — Anesthesia Procedure Notes (Signed)
Procedure Name: MAC Date/Time: 03/08/2018 10:11 AM Performed by: Oletta Lamas, CRNA Pre-anesthesia Checklist: Patient identified, Emergency Drugs available, Suction available, Patient being monitored and Timeout performed Patient Re-evaluated:Patient Re-evaluated prior to induction Oxygen Delivery Method: Simple face mask

## 2018-03-08 NOTE — ED Notes (Signed)
Patient transported to X-ray 

## 2018-03-08 NOTE — ED Notes (Signed)
Nurse drawing labs. 

## 2018-03-09 ENCOUNTER — Encounter (HOSPITAL_COMMUNITY): Payer: Self-pay | Admitting: Gastroenterology

## 2018-03-09 DIAGNOSIS — K2901 Acute gastritis with bleeding: Secondary | ICD-10-CM

## 2018-03-09 DIAGNOSIS — I10 Essential (primary) hypertension: Secondary | ICD-10-CM

## 2018-03-09 DIAGNOSIS — D649 Anemia, unspecified: Secondary | ICD-10-CM

## 2018-03-09 DIAGNOSIS — I959 Hypotension, unspecified: Secondary | ICD-10-CM

## 2018-03-09 LAB — CBC WITH DIFFERENTIAL/PLATELET
Abs Immature Granulocytes: 0 10*3/uL (ref 0.0–0.1)
Basophils Absolute: 0 10*3/uL (ref 0.0–0.1)
Basophils Relative: 0 %
Eosinophils Absolute: 0 10*3/uL (ref 0.0–0.7)
Eosinophils Relative: 0 %
HCT: 23.5 % — ABNORMAL LOW (ref 36.0–46.0)
Hemoglobin: 7.6 g/dL — ABNORMAL LOW (ref 12.0–15.0)
Immature Granulocytes: 0 %
Lymphocytes Relative: 23 %
Lymphs Abs: 1.8 10*3/uL (ref 0.7–4.0)
MCH: 30.8 pg (ref 26.0–34.0)
MCHC: 32.3 g/dL (ref 30.0–36.0)
MCV: 95.1 fL (ref 78.0–100.0)
Monocytes Absolute: 0.7 10*3/uL (ref 0.1–1.0)
Monocytes Relative: 10 %
Neutro Abs: 5.2 10*3/uL (ref 1.7–7.7)
Neutrophils Relative %: 67 %
Platelets: 154 10*3/uL (ref 150–400)
RBC: 2.47 MIL/uL — ABNORMAL LOW (ref 3.87–5.11)
RDW: 14.6 % (ref 11.5–15.5)
WBC: 7.8 10*3/uL (ref 4.0–10.5)

## 2018-03-09 LAB — BASIC METABOLIC PANEL
Anion gap: 3 — ABNORMAL LOW (ref 5–15)
BUN: 44 mg/dL — ABNORMAL HIGH (ref 8–23)
CO2: 24 mmol/L (ref 22–32)
Calcium: 8 mg/dL — ABNORMAL LOW (ref 8.9–10.3)
Chloride: 118 mmol/L — ABNORMAL HIGH (ref 98–111)
Creatinine, Ser: 0.56 mg/dL (ref 0.44–1.00)
GFR calc Af Amer: >60 mL/min (ref >60)
GFR calc non Af Amer: >60 mL/min (ref >60)
Glucose, Bld: 114 mg/dL — ABNORMAL HIGH (ref 70–99)
Potassium: 3.9 mmol/L (ref 3.5–5.1)
Sodium: 145 mmol/L (ref 135–145)

## 2018-03-09 LAB — PREPARE RBC (CROSSMATCH)

## 2018-03-09 LAB — HEMOGLOBIN AND HEMATOCRIT, BLOOD
HCT: 36.2 % (ref 36.0–46.0)
Hemoglobin: 11.8 g/dL — ABNORMAL LOW (ref 12.0–15.0)

## 2018-03-09 MED ORDER — SODIUM CHLORIDE 0.9% IV SOLUTION
Freq: Once | INTRAVENOUS | Status: AC
  Administered 2018-03-09: 10:00:00 via INTRAVENOUS

## 2018-03-09 MED ORDER — DICLOFENAC SODIUM 1 % TD GEL
2.0000 g | Freq: Four times a day (QID) | TRANSDERMAL | Status: DC
  Administered 2018-03-09 – 2018-03-10 (×3): 2 g via TOPICAL
  Filled 2018-03-09: qty 100

## 2018-03-09 NOTE — Progress Notes (Addendum)
PROGRESS NOTE [facility]   Phyllis [name]   WUJ:[id number] DOB: [birth year]  DOA: 03/08/2018 PCP: [name], MD   Brief Narrative:  Phyllis [name] is a 77 yo female who presented to the ED yesterday AM with diaphoresis, dyspepsia, nausea, and lightheadedness. PMH significant for GERD, anxiety, HTN, and OA. Patient denied abdominal pain, hematemesis, melena, or hematochezia. Patient states that she took a Tums that did not provide relief of symptoms. Patient was hypotensive, determined to be vasovagal, upon EMS arrival. Patient had apparently discontinued her prescribed PPIs after reading an article online about a potential link to dementia. EKG and CXR performed in ED was negative. Blood pressure came up to 120/70s with IVF. Fecal occult blood test was positive. CBC on arrival demonstrated a Hgb of 9.2 which dropped to 7.1 later that afternoon. Hgb has improved this morning to 7.6 after being transfused with one unit PRBC last night. GI performed an upper UGD performed yesterday demonstrated a 2 cm hiatal hernia, a large amount of hematin in the fundus of the stomach, and two non-bleeding gastric ulcers.  Subjective: Patient was ambulating with assistance back to her bed this morning upon my arrival. She states that she is no longer experiencing symptoms of dyspepsia or nausea, but still admits to fatigue and was SOB w/ exertion. She states that she does not usually get SOB w/ ambulation and it is likely due to her anemia. Patient also admits that her stool this morning did appear black and that she cannot definitively say if she was having melena or hematochezia prior to her admission. After having spoken with the GI consult, the patient understands that her upper GI bleed is at least partly due to her routine use of meloxicam and agrees to find another way to manage her chronic OA pain. She also told me that she had discontinued her prescribed PPI after reading online that it will cause  her to develop dementia, but I provided reassurance that those studies have not demonstrated causation and only some have shown correlation. Furthermore, given her EGD findings and acute blood loss, she understands the importance of restarting her PPI therapy to foster environment conducive to ulcer healing.    Assessment & Plan:   Principal Problem:   Acute upper GI bleeding Active Problems:   Essential hypertension, benign   Reflux esophagitis   Transient hypotension   Symptomatic anemia  Symptomatic anemia, due to acute blood loss Patient presented with symptoms consistent with anemia and it was confirmed with lab work. EGD performed yesterday found a 2 cm hiatal hernia, significant amount of hematin in the fundus of stomach, and two non-bleeding ulcers that were the likely culprit for her acute blood loss. While she initially denied melena or hematochezia, she admits that her stool this morning was black and that she may not have been paying close enough attention prior to presentation to ED. Patient improved from 7.1 to 7.6 after last night's unit of PRBC.  - Provide an additional unit of PRBC - Avoid use of ASA/NSAIDs moving forward in perpetuity - H. Pylori serologies pending - Advance diet as tolerated  Gastric ulcers x 2, clean-based Patient has been taking meloxicam for her OA and recently discontinued her prescribed PPI after reading article online about potential adverse effects to long-term PPI use.  - Briefly discussed the lack of definitive evidence regarding chronic PPIs  - Re-start her PPI to help create environment conducive to ulcer healing - Avoid meloxicam and other NSAIDs  in perpetuity given her severe GI bleed  Headache, secondary to osteoarthritis  Patient admits to h/o osteoarthritis for which she has taken meloxicam with relief. Given this admission, we will be strongly encouraging that avoid the use of NSAIDs/ASA when managing her pain to mitigate risk of another  upper GI bleed. 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 - Trial of topical diclofenac (Voltaren) to be applied to upper cervical spine    DVT prophylaxis: SCDs Code Status: Full-code Family Communication: Family not present for discussion Disposition Plan: Will watch for Hgb to stabilize before discharging   Consultants:   Gastroenterology, Dr. Dulce Sellarutlaw  Procedures:   None  Antimicrobials:  None   Objective: Vitals:   03/09/18 0754 03/09/18 1136 03/09/18 1137 03/09/18 1205  BP: 123/80 (!) 143/128 138/87 (!) 146/79  Pulse: (!) 125 96 99 94  Resp: 18 16 17    Temp: 97.9 F (36.6 C) 98.7 F (37.1 C) 97.9 F (36.6 C) 98.4 F (36.9 C)  TempSrc: Oral Oral Oral Oral  SpO2: 98% 100% 100% 100%  Weight:      Height:        Intake/Output Summary (Last 24 hours) at 03/09/2018 1358 Last data filed at 03/09/2018 1008 Gross per 24 hour  Intake 555 ml  Output 4 ml  Net 551 ml   Filed Weights   03/08/18 0457 03/08/18 0950  Weight: 55.8 kg 55.8 kg    Examination:  General exam: Patient appears SOB on exertion, weak, but in no acute distress Respiratory system: Clear to auscultation. No wheezes, crackles, or rhonchi. Slightly increased respiratory effort. Cardiovascular system: S1 & S2 heard, RRR. No JVD, murmurs, rubs, or gallops Gastrointestinal system: Abdomen is nondistended, soft and nontender. No organomegaly or masses felt. Normal bowel sounds heard. Central nervous system: Alert and oriented. No focal neurological deficits. Extremities: No pedal edema. Symmetric, strength 5/5   Skin: No rashes, lesions or ulcers Psychiatry: Judgment and insight appear normal. Mood & affect appropriate.    Data Reviewed: I have personally reviewed following labs and imaging studies  CBC: Recent Labs  Lab 03/08/18 0458 03/08/18 1832 03/09/18 0704  WBC 10.3  --  7.8  NEUTROABS 8.2*  --  5.2  HGB 9.2*   8.8* 7.1* 7.6*  HCT 29.2*  26.0* 22.4* 23.5*  MCV 100.7*  --  95.1  PLT 213  --  154   Basic Metabolic Panel: Recent Labs  Lab 03/08/18 0458 03/08/18 0605 03/09/18 0704  NA 141 142 145  K 4.3 4.2 3.9  CL 108 106 118*  CO2  --  25 24  GLUCOSE 104* 105* 114*  BUN 47* 48* 44*  CREATININE 0.80 0.73 0.56  CALCIUM  --  9.3 8.0*   GFR: Estimated Creatinine Clearance: 51.1 mL/min (by C-G formula based on SCr of 0.56 mg/dL). Liver Function Tests: Recent Labs  Lab 03/08/18 0605  AST 23  ALT <5  ALKPHOS 36*  BILITOT 0.2*  PROT 5.0*  ALBUMIN 3.2*   No results for input(s): LIPASE, AMYLASE in the last 168 hours. No results for input(s): AMMONIA in the last 168 hours. Coagulation Profile: No results for input(s): INR, PROTIME in the last 168 hours. Cardiac Enzymes: No results for input(s): CKTOTAL, CKMB, CKMBINDEX, TROPONINI in the last 168 hours. BNP (last 3 results) No results for input(s): PROBNP in the last 8760 hours. HbA1C: No results for input(s): HGBA1C in the last 72 hours. CBG: No results  for input(s): GLUCAP in the last 168 hours. Lipid Profile: No results for input(s): CHOL, HDL, LDLCALC, TRIG, CHOLHDL, LDLDIRECT in the last 72 hours. Thyroid Function Tests: No results for input(s): TSH, T4TOTAL, FREET4, T3FREE, THYROIDAB in the last 72 hours. Anemia Panel: No results for input(s): VITAMINB12, FOLATE, FERRITIN, TIBC, IRON, RETICCTPCT in the last 72 hours. Sepsis Labs: No results for input(s): PROCALCITON, LATICACIDVEN in the last 168 hours.  Recent Results (from the past 240 hour(s))  MRSA PCR Screening     Status: None   Collection Time: 03/08/18  8:10 AM  Result Value Ref Range Status   MRSA by PCR NEGATIVE NEGATIVE Final    Comment:        The GeneXpert MRSA Assay (FDA approved for NASAL specimens only), is one component of a comprehensive MRSA colonization surveillance program. It is not intended to diagnose MRSA infection nor to guide or monitor  treatment for MRSA infections. Performed at [facility], [address] [place], [place] [address]       Radiology Studies: Dg Chest 2 View  Result Date: 03/08/2018 CLINICAL DATA:  Shortness of breath. EXAM: CHEST - 2 VIEW COMPARISON:  None. FINDINGS: The cardiomediastinal contours are normal. Aortic atherosclerosis. There is biapical pleuroparenchymal scarring. Pulmonary vasculature is normal. No consolidation, pleural effusion, or pneumothorax. No acute osseous abnormalities are seen. IMPRESSION: No acute finding. Electronically Signed   By: Narda Rutherford M.D.   On: 03/08/2018 05:49      Scheduled Meds: . diclofenac sodium  2 g Topical QID  . pantoprazole (PROTONIX) IV  40 mg Intravenous BID AC  . pravastatin  40 mg Oral q1800  . sodium chloride flush  3 mL Intravenous Q12H  . venlafaxine XR  150 mg Oral Q breakfast   Continuous Infusions:   LOS: 1 day    Time spent: Total of 30 minutes spent with pt, greater than 50% of which was spent in discussion of  treatment, counseling and coordination of care    Evelena Leyden, PA-S Pager: Text Page via [email or web address]   If 7PM-7AM, please contact night-coverage [email or web address] 03/09/2018, 1:58 PM   Note - This record has been created using [employer]. Chart creation errors have been sought, but may not always have been located. Such creation errors do not reflect on the standard of medical care.

## 2018-03-09 NOTE — Progress Notes (Addendum)
PROGRESS NOTE    Phyllis EvertsLynda K [name]  UJW:[id number] DOB: 09/29/1940 DOA: 03/08/2018 PCP: Barbie BannerWilson, Fred H, MD    Brief Narrative:  77 year old female who presented with diaphoresis, lightheadedness, nausea and indigestion.  She does have significant past medical history for hypertension, GERD and anxiety.  Patient woke up  early in the morning on the day of admission with dyspepsia, she took two antiacid tablets "tums", with persistent symptoms. Then she developed profuse diaphoresis and generalized weakness associated with lightheadedness, no syncope episode. Symptoms were severe enough that EMS was called and she was found hypotensive, 80/60.  Denied any melena, hematemesis or hematochezia. She is physically active, has chronic reflux disease, has Tums at the bedside, and recently stop taking proton pump inhibitor to prevent side effects.  After IV fluids her blood pressure was 120/70, heart rate 110, respiratory rate 20, oxygen saturation 94%.  She was anxious, moist mucous membranes, lungs were clear to auscultation bilaterally, heart S1-S2 present, tachycardic, abdomen with no distention, no tenderness or rebound, no lower extremity edema.  Sodium 142, potassium 4.8, chloride 106, bicarb 25, glucose 105, BUN 48, creatinine 0.73, white count 10.3, hemoglobin 9.2, hematocrit 29.2, platelets 213, d-dimer 0.38, urine analysis negative for infection. Fecal occult blood was positive.  Chest radiograph with signs of hyperinflation, no infiltrates.  EKG with sinus tachycardia, normal axis and normal intervals.  Patient was admitted to the hospital with a working diagnosis of hypotension, to rule out acute blood loss anemia due to upper GI bleed.   Assessment & Plan:   Principal Problem:   Acute upper GI bleeding Active Problems:   Essential hypertension, benign   Reflux esophagitis   Transient hypotension   Symptomatic anemia  1. Acute blood loss anemia due to acute upper GI bleed.  Endoscopy with 2  non bleeding cratered gastric ulcers with no stigmata of bleeding. The stomach had altered blood/ coffee ground material. Patient continue to be symptomatic and positive melena. Required one unit PRBC last night and will transfuse second unit today for persistent symptomatic anemia with Hb of 7,8 and hct at 23.5. She has been tachycardic overnight and this am with heart rate up to 125 bpm, low systolic blood pressure 107 mmHg. Continue H&H monitoring post PRBC transfusion.   3. HTN. Continue to hold on antihypertensive agents due to risk of worsening hypotension. Blood pressure systolic this am 562107 to 120 mmHG.   4. Dyslipidemia. On pravastatin with good toleration  5. Anxiety. On lorazepam, and venlafaxine. No agitation.   6. Osteoarthritis. Will use topical diclofenac and will avoid non steroidal anti-inflammatory agents.    DVT prophylaxis: scd   Code Status:  full Family Communication: No family at the bedside  Disposition Plan/ discharge barriers: Pending hemodynamic stability.    Consultants:   Gi   Procedures:     Antimicrobials   Subjective: Patient has melena this am with bowel movement, received one unit prbc, no nausea or vomiting. Positive headache, cervical and occipital, no chest pain or dyspnea. Positive generalized weakness and easy fatigability.   Objective: Vitals:   03/09/18 0013 03/09/18 0031 03/09/18 0335 03/09/18 0754  BP:  124/72 107/84 123/80  Pulse: (!) 111 (!) 113 100 (!) 125  Resp:  (!) 22 (!) 22 18  Temp:  99.5 F (37.5 C) 98.5 F (36.9 C) 97.9 F (36.6 C)  TempSrc:  Oral Oral Oral  SpO2: 99% 95% 98% 98%  Weight:      Height:  Intake/Output Summary (Last 24 hours) at 03/09/2018 1000 Last data filed at 03/09/2018 0335 Gross per 24 hour  Intake 465 ml  Output 4 ml  Net 461 ml   Filed Weights   03/08/18 0457 03/08/18 0950  Weight: 55.8 kg 55.8 kg    Examination:   General: deconditioned  Neurology: Awake and alert,  non focal  E ENT: mild pallor, no icterus, oral mucosa moist Cardiovascular: No JVD. S1-S2 present, rhythmic, no gallops, rubs, or murmurs. No lower extremity edema. Pulmonary: positive breath sounds bilaterally, adequate air movement, no wheezing, rhonchi or rales. Gastrointestinal. Abdomen with no organomegaly, non tender, no rebound or guarding Skin. No rashes Musculoskeletal: no joint deformities     Data Reviewed: I have personally reviewed following labs and imaging studies  CBC: Recent Labs  Lab 03/08/18 0458 03/08/18 1832 03/09/18 0704  WBC 10.3  --  7.8  NEUTROABS 8.2*  --  5.2  HGB 9.2*  8.8* 7.1* 7.6*  HCT 29.2*  26.0* 22.4* 23.5*  MCV 100.7*  --  95.1  PLT 213  --  154   Basic Metabolic Panel: Recent Labs  Lab 03/08/18 0458 03/08/18 0605 03/09/18 0704  NA 141 142 145  K 4.3 4.2 3.9  CL 108 106 118*  CO2  --  25 24  GLUCOSE 104* 105* 114*  BUN 47* 48* 44*  CREATININE 0.80 0.73 0.56  CALCIUM  --  9.3 8.0*   GFR: Estimated Creatinine Clearance: 51.1 mL/min (by C-G formula based on SCr of 0.56 mg/dL). Liver Function Tests: Recent Labs  Lab 03/08/18 0605  AST 23  ALT <5  ALKPHOS 36*  BILITOT 0.2*  PROT 5.0*  ALBUMIN 3.2*   No results for input(s): LIPASE, AMYLASE in the last 168 hours. No results for input(s): AMMONIA in the last 168 hours. Coagulation Profile: No results for input(s): INR, PROTIME in the last 168 hours. Cardiac Enzymes: No results for input(s): CKTOTAL, CKMB, CKMBINDEX, TROPONINI in the last 168 hours. BNP (last 3 results) No results for input(s): PROBNP in the last 8760 hours. HbA1C: No results for input(s): HGBA1C in the last 72 hours. CBG: No results for input(s): GLUCAP in the last 168 hours. Lipid Profile: No results for input(s): CHOL, HDL, LDLCALC, TRIG, CHOLHDL, LDLDIRECT in the last 72 hours. Thyroid Function Tests: No results for input(s): TSH, T4TOTAL, FREET4, T3FREE, THYROIDAB in the last 72 hours. Anemia  Panel: No results for input(s): VITAMINB12, FOLATE, FERRITIN, TIBC, IRON, RETICCTPCT in the last 72 hours.    Radiology Studies: I have reviewed all of the imaging during this hospital visit personally     Scheduled Meds: . sodium chloride   Intravenous Once  . pantoprazole (PROTONIX) IV  40 mg Intravenous BID AC  . pravastatin  40 mg Oral q1800  . sodium chloride flush  3 mL Intravenous Q12H  . venlafaxine XR  150 mg Oral Q breakfast   Continuous Infusions:   LOS: 1 day        Mauricio Annett Gula, MD [facility] Pager [phone]

## 2018-03-09 NOTE — Progress Notes (Signed)
Subjective: Increased weakness; hgb drop ~ 2 grams. Some melena yesterday post-procedure.  Objective: Vital signs in last 24 hours: Temp:  [97.9 F (36.6 C)-100 F (37.8 C)] 97.9 F (36.6 C) (09/16 0754) Pulse Rate:  [93-125] 125 (09/16 0754) Resp:  [18-22] 18 (09/16 0754) BP: (107-149)/(72-84) 123/80 (09/16 0754) SpO2:  [92 %-99 %] 98 % (09/16 0754) Weight change: -0.001 kg Last BM Date: 03/08/18  PE: GEN:  Pale, weak-appearing but NAD ABD:  Soft, non-tender SKIN:  Pale  Lab Results: CBC    Component Value Date/Time   WBC 7.8 03/09/2018 0704   RBC 2.47 (L) 03/09/2018 0704   HGB 7.6 (L) 03/09/2018 0704   HCT 23.5 (L) 03/09/2018 0704   PLT 154 03/09/2018 0704   MCV 95.1 03/09/2018 0704   MCH 30.8 03/09/2018 0704   MCHC 32.3 03/09/2018 0704   RDW 14.6 03/09/2018 0704   LYMPHSABS 1.8 03/09/2018 0704   MONOABS 0.7 03/09/2018 0704   EOSABS 0.0 03/09/2018 0704   BASOSABS 0.0 03/09/2018 0704   CMP     Component Value Date/Time   NA 145 03/09/2018 0704   K 3.9 03/09/2018 0704   CL 118 (H) 03/09/2018 0704   CO2 24 03/09/2018 0704   GLUCOSE 114 (H) 03/09/2018 0704   BUN 44 (H) 03/09/2018 0704   CREATININE 0.56 03/09/2018 0704   CALCIUM 8.0 (L) 03/09/2018 0704   PROT 5.0 (L) 03/08/2018 0605   ALBUMIN 3.2 (L) 03/08/2018 0605   AST 23 03/08/2018 0605   ALT <5 03/08/2018 0605   ALKPHOS 36 (L) 03/08/2018 0605   BILITOT 0.2 (L) 03/08/2018 0605   GFRNONAA >60 03/09/2018 0704   GFRAA >60 03/09/2018 0704   Assessment:  1.  Acute blood loss anemia, worsening. 2.  Melena.  Suspect function old blood; based on yesterday's endoscopy, patient at low risk for rebleeding. 3.  Gastric ulcers x 2, clean-based, in antrum.  Likely NSAID-related.  Plan:  1.  Agree with blood transfusion. 2.  PPI bid. 3.  No ASA/NSAIDs, both now and indefinitely if possible. 4.  Awaiting H. Pylori serologies. 5.  Advance diet as tolerated. 6.  Given weakness and interval drop in Hgb, would  watch today, and hope for discharge home tomorrow if Hgb stabilizes. 7.  [facility] 8.  [facility] will follow.   Phyllis JakschOUTLAW,Phyllis Arshad M 03/09/2018, 11:01 AM   Cell [phone] If no answer or after 5 PM call [phone]

## 2018-03-09 NOTE — Progress Notes (Signed)
Received 1 unit of PRBC with s/s adverse reaction noted.

## 2018-03-10 DIAGNOSIS — K210 Gastro-esophageal reflux disease with esophagitis: Secondary | ICD-10-CM

## 2018-03-10 LAB — BASIC METABOLIC PANEL
Anion gap: 6 (ref 5–15)
BUN: 16 mg/dL (ref 8–23)
CO2: 24 mmol/L (ref 22–32)
Calcium: 8 mg/dL — ABNORMAL LOW (ref 8.9–10.3)
Chloride: 112 mmol/L — ABNORMAL HIGH (ref 98–111)
Creatinine, Ser: 0.58 mg/dL (ref 0.44–1.00)
GFR calc Af Amer: >60 mL/min (ref >60)
GFR calc non Af Amer: >60 mL/min (ref >60)
Glucose, Bld: 104 mg/dL — ABNORMAL HIGH (ref 70–99)
Potassium: 3.4 mmol/L — ABNORMAL LOW (ref 3.5–5.1)
Sodium: 142 mmol/L (ref 135–145)

## 2018-03-10 LAB — CBC WITH DIFFERENTIAL/PLATELET
Abs Immature Granulocytes: 0 10*3/uL (ref 0.0–0.1)
Basophils Absolute: 0.1 10*3/uL (ref 0.0–0.1)
Basophils Relative: 1 %
Eosinophils Absolute: 0.2 10*3/uL (ref 0.0–0.7)
Eosinophils Relative: 3 %
HCT: 25.4 % — ABNORMAL LOW (ref 36.0–46.0)
Hemoglobin: 8.5 g/dL — ABNORMAL LOW (ref 12.0–15.0)
Immature Granulocytes: 0 %
Lymphocytes Relative: 38 %
Lymphs Abs: 2.3 10*3/uL (ref 0.7–4.0)
MCH: 30.7 pg (ref 26.0–34.0)
MCHC: 33.5 g/dL (ref 30.0–36.0)
MCV: 91.7 fL (ref 78.0–100.0)
Monocytes Absolute: 0.5 10*3/uL (ref 0.1–1.0)
Monocytes Relative: 8 %
Neutro Abs: 2.9 10*3/uL (ref 1.7–7.7)
Neutrophils Relative %: 50 %
Platelets: 126 10*3/uL — ABNORMAL LOW (ref 150–400)
RBC: 2.77 MIL/uL — ABNORMAL LOW (ref 3.87–5.11)
RDW: 14.7 % (ref 11.5–15.5)
WBC: 5.9 10*3/uL (ref 4.0–10.5)

## 2018-03-10 LAB — BPAM RBC
Blood Product Expiration Date: 201910172359
Blood Product Expiration Date: 201910172359
ISSUE DATE / TIME: 201909160000
ISSUE DATE / TIME: 201909161143
Unit Type and Rh: 9500
Unit Type and Rh: 9500

## 2018-03-10 LAB — TYPE AND SCREEN
ABO/RH(D): O NEG
Antibody Screen: POSITIVE
Unit division: 0
Unit division: 0

## 2018-03-10 LAB — H. PYLORI ANTIBODY, IGG: H Pylori IgG: 0.16 Index Value (ref 0.00–0.79)

## 2018-03-10 MED ORDER — PANTOPRAZOLE SODIUM 40 MG PO TBEC
40.0000 mg | DELAYED_RELEASE_TABLET | Freq: Two times a day (BID) | ORAL | Status: DC
  Administered 2018-03-10: 40 mg via ORAL
  Filled 2018-03-10: qty 1

## 2018-03-10 MED ORDER — DICLOFENAC SODIUM 1 % TD GEL: 2.0000 g | Freq: Four times a day (QID) | TRANSDERMAL | 0 refills | Status: DC | PRN

## 2018-03-10 MED ORDER — PANTOPRAZOLE SODIUM 40 MG PO TBEC: 40.0000 mg | DELAYED_RELEASE_TABLET | Freq: Two times a day (BID) | ORAL | 0 refills | Status: DC

## 2018-03-10 NOTE — Progress Notes (Signed)
Subjective: Tolerating liquid diet. Stools much less dark. Feels less weak.  Objective: Vital signs in last 24 hours: Temp:  [97.9 F (36.6 C)-98.7 F (37.1 C)] 97.9 F (36.6 C) (09/17 0806) Pulse Rate:  [89-108] 108 (09/17 0806) Resp:  [15-17] 17 (09/17 0400) BP: (131-153)/(70-128) 151/81 (09/17 0806) SpO2:  [99 %-100 %] 100 % (09/17 0806) Weight:  [60.6 kg] 60.6 kg (09/17 0500) Weight change: 4.808 kg Last BM Date: 03/08/18  PE: GEN:  NAD, pale ABD:  Soft, non-tedner  Lab Results: CBC    Component Value Date/Time   WBC 5.9 03/10/2018 0405   RBC 2.77 (L) 03/10/2018 0405   HGB 8.5 (L) 03/10/2018 0405   HCT 25.4 (L) 03/10/2018 0405   PLT 126 (L) 03/10/2018 0405   MCV 91.7 03/10/2018 0405   MCH 30.7 03/10/2018 0405   MCHC 33.5 03/10/2018 0405   RDW 14.7 03/10/2018 0405   LYMPHSABS 2.3 03/10/2018 0405   MONOABS 0.5 03/10/2018 0405   EOSABS 0.2 03/10/2018 0405   BASOSABS 0.1 03/10/2018 0405   CMP     Component Value Date/Time   NA 142 03/10/2018 0405   K 3.4 (L) 03/10/2018 0405   CL 112 (H) 03/10/2018 0405   CO2 24 03/10/2018 0405   GLUCOSE 104 (H) 03/10/2018 0405   BUN 16 03/10/2018 0405   CREATININE 0.58 03/10/2018 0405   CALCIUM 8.0 (L) 03/10/2018 0405   PROT 5.0 (L) 03/08/2018 0605   ALBUMIN 3.2 (L) 03/08/2018 0605   AST 23 03/08/2018 0605   ALT <5 03/08/2018 0605   ALKPHOS 36 (L) 03/08/2018 0605   BILITOT 0.2 (L) 03/08/2018 0605   GFRNONAA >60 03/10/2018 0405   GFRAA >60 03/10/2018 0405   Assessment:  1.  Acute blood loss anemia, improving.  11.8 value likely spurious; 8.5 value consistent with 1 unit transfused from prior 7.6 Hgb. 2.  Melena.  Improving.  I don't think patient is bleeding any longer. 3.  Gastric ulcers x 2, clean-based, in antrum.  Likely NSAID-related.  Plan:  1.  Advance diet. 2.  No NSAIDs (meloxicam, asa, ibuprofen, naproxen, etc.). 3.  Pantoprazole 40 mg po bid until further notice. 4.  OK to discharge today from GI  perspective; will need CBC check with her PCP Dr. Benedetto GoadFred Wilson next week; we will arrange outpatient follow-up with [facility] in the next several weeks. 5.  [facility] will sign-off; please call with questions; thank you for the consultation.   Freddy JakschUTLAW,Jaymir Struble M 03/10/2018, 9:00 AM   Cell [phone] If no answer or after 5 PM call [phone]

## 2018-03-10 NOTE — Discharge Summary (Signed)
Physician Discharge Summary  Phyllis EvertsLynda K [name] ZOX:[id number] DOB: 01/09/1941 DOA: 03/08/2018  PCP: Barbie BannerWilson, Fred H, MD  Admit date: 03/08/2018 Discharge date: 03/10/2018  Admitted From: Home  Disposition:  Home   Recommendations for Outpatient Follow-up and new medication changes:  1. Follow up with Dr. Andrey CampanileWilson in 7 days 2. Please follow complete blood count in 7 days 3. Meloxicam has been held, instructed to avoid any other non steroidal anti-inflammatory agents.  4. Follow with the GI clinic in 2 to 3 weeks 5. Patient has been placed on bid pantoprazole 40 mg   Home Health: No  Equipment/Devices: no   Discharge Condition: Stable  CODE STATUS:  full  Diet recommendation: heart healthy   Brief/Interim Summary: 77 year old female who presented with diaphoresis, lightheadedness, nausea and indigestion. She does have significant past medical history for hypertension, GERDand anxiety. Patient woke up early in the morning on the day of admission with dyspepsia, she took two antiacid tablets "tums", with persistent symptoms. Then she developed profuse diaphoresis and generalized weakness associated with lightheadedness, but no syncope episode.Symptoms were severe enough that EMS was called and she was found hypotensive, 80/60. Deniedany melena, hematemesis or hematochezia. She is physically active, has chronic reflux disease, has Tums at the bedside, and recently stop taking proton pump inhibitor to prevent side effects. After IV fluids her blood pressure was 120/70, heart rate 110,respiratory rate 20, oxygen saturation 94%. She was anxious, moist mucous membranes, lungs wereclear to auscultation bilaterally, heart S1-S2 present, tachycardic, abdomen with no distention, no tenderness or rebound, no lower extremity edema. Sodium 142, potassium 4.8, chloride 106, bicarb 25, glucose 105, BUN 48, creatinine 0.73, white count 10.3, hemoglobin 9.2, hematocrit 29.2,platelets 213,d-dimer 0.38,urine  analysis negative for infection. Fecal occult blood was positive. Chest radiograph with signs of hyperinflation, no infiltrates. EKG with sinus tachycardia, normal axis and normal intervals.  Patient was admitted to the hospital with a working diagnosis of hypotension, to rule out acute blood loss anemia due to upper GI bleed.  1.  Acute blood loss anemia due to acute upper GI bleed/2 gastric ulcers.  Patient was admitted to the stepdown unit, she received IV fluids and frequent hemoglobin and hematocrit monitoring.  Intravenous proton pump inhibitors, and as needed antiemetics.  Underwent upper endoscopy showing 2 one nonbleeding cratered gastric ulcers with no stigmata of bleeding, the stomach had altered blood and coffee-ground material.  Her hemoglobin dropped to 7.1 with hematocrit 22.4.  She required 2 units of packed red blood cells transfusion, discharge hemoglobin 8.5, hematocrit 25.4.  Patient was advised to avoid NSAIDs, patient will be discharged pantoprazole 40 mg twice daily.   2.  Hypertension.  Antihypertensive agents were held during this hospitalization, patient will resume losartan at discharge, 50 mg daily.  3.  Dyslipidemia.  Patient was continued on pravastatin.  4.  Anxiety.  Continue lorazepam and venlafaxine.  5.  Osteoarthritis.  Meloxicam was discontinued, she was prescribed topical diclofenac with improvement of cervical pain.  Discharge Diagnoses:  Principal Problem:   Acute upper GI bleeding Active Problems:   Essential hypertension, benign   Reflux esophagitis   Transient hypotension   Symptomatic anemia    Discharge Instructions   Allergies as of 03/10/2018      Reactions   Azithromycin    Developed a rash right after she quit taking it      Medication List    STOP taking these medications   ESGIC 50-325-40 MG tablet Generic drug:  butalbital-acetaminophen-caffeine   meloxicam  7.5 MG tablet Commonly known as:  MOBIC     TAKE these  medications   alendronate 70 MG tablet Commonly known as:  FOSAMAX Take 70 mg by mouth every Saturday.   clotrimazole-betamethasone cream Commonly known as:  LOTRISONE Apply 1 application topically daily as needed.   diclofenac sodium 1 % Gel Commonly known as:  VOLTAREN Apply 2 g topically 4 (four) times daily as needed (as needed for neck pain.). Apply at the base of the back of the neck.   hydrOXYzine 10 MG tablet Commonly known as:  ATARAX/VISTARIL Take 10 mg by mouth 3 (three) times daily as needed for itching.   LORazepam 0.5 MG tablet Commonly known as:  ATIVAN Take 0.5-1 mg by mouth daily as needed for anxiety.   losartan 50 MG tablet Commonly known as:  COZAAR Take 50 mg by mouth daily.   Melatonin 5 MG Tabs Take 5 mg by mouth at bedtime.   multivitamin tablet Take 1 tablet by mouth daily.   pantoprazole 40 MG tablet Commonly known as:  PROTONIX Take 1 tablet (40 mg total) by mouth 2 (two) times daily.   pravastatin 40 MG tablet Commonly known as:  PRAVACHOL Take 40 mg by mouth daily.   venlafaxine XR 150 MG 24 hr capsule Commonly known as:  EFFEXOR-XR Take 150 mg by mouth daily with breakfast.   vitamin B-12 1000 MCG tablet Commonly known as:  CYANOCOBALAMIN Take 1,000 mcg by mouth daily.       Allergies  Allergen Reactions  . Azithromycin     Developed a rash right after she quit taking it    Consultations:  GI    Procedures/Studies: Dg Chest 2 View  Result Date: 03/08/2018 CLINICAL DATA:  Shortness of breath. EXAM: CHEST - 2 VIEW COMPARISON:  None. FINDINGS: The cardiomediastinal contours are normal. Aortic atherosclerosis. There is biapical pleuroparenchymal scarring. Pulmonary vasculature is normal. No consolidation, pleural effusion, or pneumothorax. No acute osseous abnormalities are seen. IMPRESSION: No acute finding. Electronically Signed   By: Narda Rutherford M.D.   On: 03/08/2018 05:49       Subjective: Patient is feeling  better, no nausea or vomiting, tolerating po well, improved weakness.   Discharge Exam: Vitals:   03/10/18 0400 03/10/18 0806  BP: 131/75 (!) 151/81  Pulse: 94 (!) 108  Resp: 17   Temp: 98 F (36.7 C) 97.9 F (36.6 C)  SpO2: 100% 100%   Vitals:   03/09/18 2314 03/10/18 0400 03/10/18 0500 03/10/18 0806  BP: (!) 153/76 131/75  (!) 151/81  Pulse: 89 94  (!) 108  Resp: 16 17    Temp: 98.1 F (36.7 C) 98 F (36.7 C)  97.9 F (36.6 C)  TempSrc: Oral Oral  Oral  SpO2: 99% 100%  100%  Weight:   60.6 kg   Height:        General: Not in pain or dyspnea Neurology: Awake and alert, non focal  E ENT: no pallor, no icterus, oral mucosa moist Cardiovascular: No JVD. S1-S2 present, rhythmic, no gallops, rubs, or murmurs. No lower extremity edema. Pulmonary: vesicular breath sounds bilaterally, adequate air movement, no wheezing, rhonchi or rales. Gastrointestinal. Abdomen with no organomegaly, non tender, no rebound or guarding Skin. No rashes Musculoskeletal: no joint deformities   The results of significant diagnostics from this hospitalization (including imaging, microbiology, ancillary and laboratory) are listed below for reference.     Microbiology: Recent Results (from the past 240 hour(s))  MRSA PCR Screening  Status: None   Collection Time: 03/08/18  8:10 AM  Result Value Ref Range Status   MRSA by PCR NEGATIVE NEGATIVE Final    Comment:        The GeneXpert MRSA Assay (FDA approved for NASAL specimens only), is one component of a comprehensive MRSA colonization surveillance program. It is not intended to diagnose MRSA infection nor to guide or monitor treatment for MRSA infections. Performed at [facility], [address] [place], [place] [address]      Labs: BNP (last 3 results) Recent Labs    03/08/18 0458  BNP 31.5   Basic Metabolic Panel: Recent Labs  Lab 03/08/18 0458 03/08/18 0605 03/09/18 0704 03/10/18 0405  NA 141 142 145 142  K  4.3 4.2 3.9 3.4*  CL 108 106 118* 112*  CO2  --  25 24 24   GLUCOSE 104* 105* 114* 104*  BUN 47* 48* 44* 16  CREATININE 0.80 0.73 0.56 0.58  CALCIUM  --  9.3 8.0* 8.0*   Liver Function Tests: Recent Labs  Lab 03/08/18 0605  AST 23  ALT <5  ALKPHOS 36*  BILITOT 0.2*  PROT 5.0*  ALBUMIN 3.2*   No results for input(s): LIPASE, AMYLASE in the last 168 hours. No results for input(s): AMMONIA in the last 168 hours. CBC: Recent Labs  Lab 03/08/18 0458 03/08/18 1832 03/09/18 0704 03/09/18 1710 03/10/18 0405  WBC 10.3  --  7.8  --  5.9  NEUTROABS 8.2*  --  5.2  --  2.9  HGB 9.2*  8.8* 7.1* 7.6* 11.8* 8.5*  HCT 29.2*  26.0* 22.4* 23.5* 36.2 25.4*  MCV 100.7*  --  95.1  --  91.7  PLT 213  --  154  --  126*   Cardiac Enzymes: No results for input(s): CKTOTAL, CKMB, CKMBINDEX, TROPONINI in the last 168 hours. BNP: Invalid input(s): POCBNP CBG: No results for input(s): GLUCAP in the last 168 hours. D-Dimer Recent Labs    03/08/18 0458  DDIMER 0.38   Hgb A1c No results for input(s): HGBA1C in the last 72 hours. Lipid Profile No results for input(s): CHOL, HDL, LDLCALC, TRIG, CHOLHDL, LDLDIRECT in the last 72 hours. Thyroid function studies No results for input(s): TSH, T4TOTAL, T3FREE, THYROIDAB in the last 72 hours.  Invalid input(s): FREET3 Anemia work up No results for input(s): VITAMINB12, FOLATE, FERRITIN, TIBC, IRON, RETICCTPCT in the last 72 hours. Urinalysis    Component Value Date/Time   COLORURINE YELLOW 03/08/2018 0514   APPEARANCEUR CLEAR 03/08/2018 0514   LABSPEC 1.021 03/08/2018 0514   PHURINE 5.0 03/08/2018 0514   GLUCOSEU NEGATIVE 03/08/2018 0514   HGBUR NEGATIVE 03/08/2018 0514   BILIRUBINUR NEGATIVE 03/08/2018 0514   KETONESUR 20 (A) 03/08/2018 0514   PROTEINUR NEGATIVE 03/08/2018 0514   NITRITE NEGATIVE 03/08/2018 0514   LEUKOCYTESUR NEGATIVE 03/08/2018 0514   Sepsis Labs Invalid input(s): PROCALCITONIN,  WBC,   LACTICIDVEN Microbiology Recent Results (from the past 240 hour(s))  MRSA PCR Screening     Status: None   Collection Time: 03/08/18  8:10 AM  Result Value Ref Range Status   MRSA by PCR NEGATIVE NEGATIVE Final    Comment:        The GeneXpert MRSA Assay (FDA approved for NASAL specimens only), is one component of a comprehensive MRSA colonization surveillance program. It is not intended to diagnose MRSA infection nor to guide or monitor treatment for MRSA infections. Performed at [facility], [address]  [place], [place] [address]      Time coordinating discharge: 45 minutes  SIGNED:   Coralie Keens, MD  [facility] 03/10/2018, 9:24 AM Pager [phone]  If 7PM-7AM, please contact night-coverage [email or web address] Password [email or web address]

## 2018-03-10 NOTE — Progress Notes (Signed)
RN and patient reviewed discharge instructions together. Pt reported that she had no further questions.

## 2018-04-15 ENCOUNTER — Other Ambulatory Visit: Payer: Self-pay | Admitting: Gastroenterology

## 2018-04-15 DIAGNOSIS — Z1211 Encounter for screening for malignant neoplasm of colon: Secondary | ICD-10-CM

## 2018-04-27 ENCOUNTER — Other Ambulatory Visit: Payer: Self-pay | Admitting: Gastroenterology

## 2018-04-30 ENCOUNTER — Ambulatory Visit
Admission: RE | Admit: 2018-04-30 | Discharge: 2018-04-30 | Disposition: A | Discharge status: Home / Self Care | Payer: Medicare Other | Source: Ambulatory Visit | Attending: Gastroenterology | Admitting: Gastroenterology

## 2018-04-30 DIAGNOSIS — Z1211 Encounter for screening for malignant neoplasm of colon: Secondary | ICD-10-CM

## 2019-07-17 ENCOUNTER — Ambulatory Visit: Payer: Medicare Other | Attending: Internal Medicine

## 2019-07-17 DIAGNOSIS — Z23 Encounter for immunization: Secondary | ICD-10-CM

## 2019-07-17 NOTE — Progress Notes (Signed)
   Covid-19 Vaccination Clinic  Name:  Phyllis [name]    MRN: [id number] DOB: 01-06-1941  07/17/2019  [name] was observed post Covid-19 immunization for 15 minutes without incidence. She was provided with Vaccine Information Sheet and instruction to access the V-Safe system.   [name] was instructed to call 911 with any severe reactions post vaccine: Marland Kitchen Difficulty breathing  . Swelling of your face and throat  . A fast heartbeat  . A bad rash all over your body  . Dizziness and weakness    Immunizations Administered    Name Date Dose VIS Date Route   Pfizer COVID-19 Vaccine 07/17/2019 12:28 PM 0.3 mL 06/04/2019 Intramuscular   Manufacturer: [employer], [employer]   Lot: [id number]   NDC: [address]-0

## 2019-08-07 ENCOUNTER — Ambulatory Visit: Payer: Medicare Other | Attending: Internal Medicine

## 2019-08-07 DIAGNOSIS — Z23 Encounter for immunization: Secondary | ICD-10-CM

## 2019-08-07 NOTE — Progress Notes (Signed)
   Covid-19 Vaccination Clinic  Name:  Phyllis [name]    MRN: [id number] DOB: [birth year]  08/07/2019  [name] was observed post Covid-19 immunization for 15 minutes without incidence. She was provided with Vaccine Information Sheet and instruction to access the V-Safe system.   [name] was instructed to call 911 with any severe reactions post vaccine: Marland Kitchen Difficulty breathing  . Swelling of your face and throat  . A fast heartbeat  . A bad rash all over your body  . Dizziness and weakness    Immunizations Administered    Name Date Dose VIS Date Route   Pfizer COVID-19 Vaccine 08/07/2019 11:54 AM 0.3 mL 06/04/2019 Intramuscular   Manufacturer: [employer], [employer]   Lot: [id number]   NDC: [address]-8

## 2020-03-03 ENCOUNTER — Other Ambulatory Visit: Payer: Self-pay | Admitting: Family Medicine

## 2020-03-03 DIAGNOSIS — M810 Age-related osteoporosis without current pathological fracture: Secondary | ICD-10-CM

## 2020-03-03 DIAGNOSIS — Z1231 Encounter for screening mammogram for malignant neoplasm of breast: Secondary | ICD-10-CM

## 2020-05-23 ENCOUNTER — Encounter (HOSPITAL_COMMUNITY): Payer: Self-pay | Admitting: Emergency Medicine

## 2020-05-23 ENCOUNTER — Emergency Department (HOSPITAL_COMMUNITY): Payer: Medicare Other

## 2020-05-23 ENCOUNTER — Emergency Department (HOSPITAL_COMMUNITY)
Admission: EM | Admit: 2020-05-23 | Discharge: 2020-05-23 | Disposition: A | Discharge status: Home / Self Care | Payer: Medicare Other | Attending: Emergency Medicine | Admitting: Emergency Medicine

## 2020-05-23 DIAGNOSIS — I10 Essential (primary) hypertension: Secondary | ICD-10-CM | POA: Insufficient documentation

## 2020-05-23 DIAGNOSIS — Z87891 Personal history of nicotine dependence: Secondary | ICD-10-CM | POA: Diagnosis not present

## 2020-05-23 DIAGNOSIS — Y92009 Unspecified place in unspecified non-institutional (private) residence as the place of occurrence of the external cause: Secondary | ICD-10-CM | POA: Insufficient documentation

## 2020-05-23 DIAGNOSIS — M25551 Pain in right hip: Secondary | ICD-10-CM | POA: Diagnosis not present

## 2020-05-23 DIAGNOSIS — S42031A Displaced fracture of lateral end of right clavicle, initial encounter for closed fracture: Secondary | ICD-10-CM | POA: Insufficient documentation

## 2020-05-23 DIAGNOSIS — W1789XA Other fall from one level to another, initial encounter: Secondary | ICD-10-CM | POA: Insufficient documentation

## 2020-05-23 DIAGNOSIS — Z79899 Other long term (current) drug therapy: Secondary | ICD-10-CM | POA: Diagnosis not present

## 2020-05-23 DIAGNOSIS — S4991XA Unspecified injury of right shoulder and upper arm, initial encounter: Secondary | ICD-10-CM | POA: Diagnosis present

## 2020-05-23 NOTE — ED Provider Notes (Signed)
[facility]-EMERGENCY DEPT Provider Note   CSN: [id number] Arrival date & time: 05/23/20  1254     History Chief Complaint  Patient presents with  . Shoulder Pain    Phyllis [name] is a 79 y.o. female.  HPI      Phyllis [name] is a 79 y.o. female, with a history of HTN, presenting to the ED primarily complaining of right shoulder pain following a fall. States she leaned against a railing to a low to the ground porch that was not secured. She does not think she hit her head. Primarily complaining of right shoulder pain, pain is moderate movement, sharp, nonradiating. Also complains of some right hip pain, though she states this is minor.  Denies LOC, neck/back pain, chest pain, shortness of breath, abdominal pain, numbness, weakness, nausea/vomiting, or any other complaints.   Past Medical History:  Diagnosis Date  . Abdominal pain, unspecified site   . Acute sinusitis, unspecified   . Contact dermatitis and other eczema due to plants (except food)   . Cough   . Cramp of limb   . Depression   . Depressive disorder, not elsewhere classified   . Essential hypertension, benign   . Headache(784.0)   . High cholesterol   . Hypertension   . Lumbago   . Migraine   . Observation for suspected cardiovascular disease   . Osteoporosis, unspecified   . Other and unspecified hyperlipidemia   . Rash and other nonspecific skin eruption   . Rectocele   . Reflux esophagitis   . Unspecified constipation     Patient Active Problem List   Diagnosis Date Noted  . Essential hypertension, benign 03/08/2018  . Reflux esophagitis 03/08/2018  . Transient hypotension 03/08/2018  . Acute upper GI bleeding 03/08/2018  . Symptomatic anemia 03/08/2018  . Near syncope   . Attention deficit disorder 01/06/2018  . Cognitive change 06/11/2017  . 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 APPENDECTOMY    .  BREAST BIOPSY     Percutaneous needle core  . ESOPHAGOGASTRODUODENOSCOPY (EGD) WITH PROPOFOL N/A 03/08/2018   Procedure: ESOPHAGOGASTRODUODENOSCOPY (EGD) WITH PROPOFOL;  Surgeon: Willis Modenautlaw, William, MD;  [facility];  Service: Endoscopy;  Laterality: N/A;     OB History   No obstetric history on file.     Family History  Problem Relation Age of Onset  . Hypertension Other   . Coronary artery disease Other   . Heart disease Mother   . ADD / ADHD Son     Social History   Tobacco Use  . Smoking status: Former Smoker    Packs/day: 2.00    Years: 12.00    Pack years: 24.00    Types: Cigarettes    Quit date: 06/24/1985    Years since quitting: 34.9  . Smokeless tobacco: Never Used  Vaping Use  . Vaping Use: Never used  Substance Use Topics  . Alcohol use: No  . Drug use: No    Home Medications Prior to Admission medications   Medication Sig Start Date End Date Taking? Authorizing Provider  alendronate (FOSAMAX) 70 MG tablet Take 70 mg by mouth every Saturday. 01/12/18   [provider]  clotrimazole-betamethasone (LOTRISONE) cream Apply 1 application topically daily as needed.    [provider]  diclofenac sodium (VOLTAREN) 1 % GEL Apply 2 g topically 4 (four) times daily as needed (as needed for neck pain.). Apply at  the base of the back of the neck. 03/10/18   Arrien, York Ram, MD  hydrOXYzine (ATARAX/VISTARIL) 10 MG tablet Take 10 mg by mouth 3 (three) times daily as needed for itching.     [provider]  LORazepam (ATIVAN) 0.5 MG tablet Take 0.5-1 mg by mouth daily as needed for anxiety.    [provider]  losartan (COZAAR) 50 MG tablet Take 50 mg by mouth daily.    [provider]  Melatonin 5 MG TABS Take 5 mg by mouth at bedtime.    [provider]  Multiple Vitamin (MULTIVITAMIN) tablet Take 1 tablet by mouth daily.    [provider]  pantoprazole (PROTONIX) 40 MG tablet Take 1 tablet (40  mg total) by mouth 2 (two) times daily. 03/10/18 04/09/18  Arrien, York Ram, MD  pravastatin (PRAVACHOL) 40 MG tablet Take 40 mg by mouth daily.    [provider]  venlafaxine XR (EFFEXOR-XR) 150 MG 24 hr capsule Take 150 mg by mouth daily with breakfast.    [provider]  vitamin B-12 (CYANOCOBALAMIN) 1000 MCG tablet Take 1,000 mcg by mouth daily.    [provider]    Allergies    Azithromycin  Review of Systems   Review of Systems  Respiratory: Negative for shortness of breath.   Cardiovascular: Negative for chest pain.  Gastrointestinal: Negative for abdominal pain, nausea and vomiting.  Musculoskeletal: Positive for arthralgias. Negative for back pain and neck pain.  Neurological: Negative for dizziness, syncope, weakness, numbness and headaches.  All other systems reviewed and are negative.   Physical Exam Updated Vital Signs BP (!) 180/98 (BP Location: Left Arm)   Pulse (!) 109   Temp 98.9 F (37.2 C) (Oral)   Resp 17   SpO2 95%   Physical Exam Vitals and nursing note reviewed.  Constitutional:      General: She is not in acute distress.    Appearance: She is well-developed. She is not diaphoretic.  HENT:     Head: Normocephalic and atraumatic.     Comments: The face and scalp were examined without noted tenderness, wounds, swelling, deformity, or instability.    Nose: Nose normal.     Mouth/Throat:     Mouth: Mucous membranes are moist.     Pharynx: Oropharynx is clear.  Eyes:     Conjunctiva/sclera: Conjunctivae normal.  Cardiovascular:     Rate and Rhythm: Normal rate and regular rhythm.     Pulses: Normal pulses.          Radial pulses are 2+ on the right side and 2+ on the left side.       Posterior tibial pulses are 2+ on the right side and 2+ on the left side.     Comments: Tactile temperature in the extremities appropriate and equal bilaterally. Pulmonary:     Effort: Pulmonary effort is normal. No respiratory  distress.     Breath sounds: Normal breath sounds.  Chest:     Chest wall: No tenderness.  Abdominal:     Palpations: Abdomen is soft.     Tenderness: There is no abdominal tenderness. There is no guarding.  Musculoskeletal:     Cervical back: Normal range of motion and neck supple.     Right lower leg: No edema.     Left lower leg: No edema.       Legs:     Comments: Tenderness and swelling over the right lateral clavicle in the area of  the [facility] joint. No tenderness to the upper chest, back, or neck. Any attempts at movement of the right shoulder are limited by pain.  Full range of motion without pain or noted difficulty in the right elbow, wrist, and fingers. Normal motor function intact in all other extremities. No midline spinal tenderness.   She does have some posterior right hip tenderness without noted deformity, instability, or swelling.  The rest of the extremities were palpated and joints were ranged without noted pain or tenderness.  Skin:    General: Skin is warm and dry.  Neurological:     Mental Status: She is alert.     Comments: No noted acute cognitive deficit. Sensation grossly intact to light touch in the extremities.   Grip strengths equal bilaterally.   Strength 5/5 in all extremities.  No gait disturbance.  (Tested after clear hip x-ray) Coordination intact.  Cranial nerves III-XII grossly intact.  Handles oral secretions without noted difficulty.  No noted phonation or speech deficit. No facial droop.   Psychiatric:        Mood and Affect: Mood and affect normal.        Speech: Speech normal.        Behavior: Behavior normal.     ED Results / Procedures / Treatments   Labs (all labs ordered are listed, but only abnormal results are displayed) Labs Reviewed - No data to display  EKG None  Radiology DG Lumbar Spine Complete  Result Date: 05/23/2020 CLINICAL DATA:  Post fall, now with right posterior hip pain. EXAM: LUMBAR SPINE - COMPLETE 4+  VIEW COMPARISON:  Right hip radiographs-earlier same day FINDINGS: There are 5 non rib-bearing lumbar type vertebral bodies Normal alignment of the lumbar spine. No anterolisthesis or retrolisthesis. The bilateral facets appear normally aligned. No definite pars defects. Lumbar vertebral body heights appear preserved. Moderate DDD of L4-L5 with disc space height loss, endplate irregularity and sclerosis. Moderate colonic stool burden without evidence of enteric obstruction. Atherosclerotic plaque within the abdominal aorta. IMPRESSION: 1. No acute findings. 2. Moderate DDD of L4-L5. Electronically Signed   By: Simonne Come M.D.   On: 05/23/2020 14:53   DG Shoulder Right  Result Date: 05/23/2020 CLINICAL DATA:  Fall.  Right shoulder pain.  Initial encounter. EXAM: RIGHT SHOULDER - 2+ VIEW COMPARISON:  None. FINDINGS: Comminuted, mildly displaced fracture of the distal right clavicle is seen. AC joint remains intact. No other fractures identified. No evidence of dislocation. IMPRESSION: Comminuted, mildly displaced fracture of the distal right clavicle. Electronically Signed   By: Danae Orleans M.D.   On: 05/23/2020 13:34   DG Hip Unilat W or Wo Pelvis 2-3 Views Right  Result Date: 05/23/2020 CLINICAL DATA:  Post fall, now with right posterior hip pain. EXAM: DG HIP (WITH OR WITHOUT PELVIS) 2-3V RIGHT COMPARISON:  None. FINDINGS: No fracture or dislocation. Mild-to-moderate degenerative change the right hip with joint space loss, subchondral sclerosis and osteophytosis. No evidence of avascular necrosis. Limited visualization of the pelvis is normal. Mild degenerative change within the left hip is suspected though incompletely evaluated. Peripherally corticated [place] adjacent to the left greater trochanter is favored to represent the sequela of remote avulsive injury. Multiple phleboliths overlie the lower pelvis bilaterally. Regional soft tissues appear otherwise normal. IMPRESSION: 1. No acute findings. 2.  Mild-to-moderate degenerative change of the right hip. Electronically Signed   By: Simonne Come M.D.   On: 05/23/2020 15:06    Procedures Procedures (including critical care time)  Medications  Ordered in ED Medications - No data to display  ED Course  I have reviewed the triage vital signs and the nursing notes.  Pertinent labs & imaging results that were available during my care of the patient were reviewed by me and considered in my medical decision making (see chart for details).    MDM Rules/Calculators/A&P                           Patient presents for evaluation following a fall.  No evidence of neurovascular compromise. I personally reviewed and interpreted the patient's x-rays. Distal right clavicle fracture present.  Patient placed in a appropriate sling, adjusted for comfort, with circulation, motor function, and sensation intact afterward. The patient was given instructions for home care as well as return precautions. Patient voices understanding of these instructions, accepts the plan, and is comfortable with discharge.  Findings and plan of care discussed with Pricilla Loveless, MD. Dr. Criss Alvine personally evaluated and examined this patient.   Final Clinical Impression(s) / ED Diagnoses Final diagnoses:  Closed displaced fracture of acromial end of right clavicle, initial encounter    Rx / DC Orders ED Discharge Orders    None       Concepcion Living 05/25/20 0731    Pricilla Loveless, MD 05/29/20 2030

## 2020-05-23 NOTE — ED Triage Notes (Signed)
Per pt, states she fell off her porch today and landed on her right shoulder

## 2020-05-23 NOTE — Discharge Instructions (Addendum)
  Fracture care There is evidence of a fracture to the right clavicle (collarbone) on the x-ray. Pain:  Acetaminophen: May take acetaminophen (generic for Tylenol), as needed, for pain. Your daily total maximum amount of acetaminophen from all sources should be limited to 4000mg /day for persons without liver problems, or 2000mg /day for those with liver problems. Ice: May apply ice to the injured area for no more than 15 minutes at a time to reduce swelling and pain. Elevation: Keep the extremity elevated whenever possible to reduce swelling and pain. Sling: Keep the sling in place throughout the day and when not bathing. Follow-up: Follow-up with the orthopedic specialist for any further management of this issue.  Call the number provided to set up an appointment. Return: Return to the emergency department for severely increased pain, numbness, blanching of the skin, or any other major concerns.

## 2020-05-27 ENCOUNTER — Other Ambulatory Visit: Payer: Self-pay

## 2020-05-27 ENCOUNTER — Emergency Department (HOSPITAL_COMMUNITY): Payer: Medicare Other

## 2020-05-27 ENCOUNTER — Inpatient Hospital Stay (HOSPITAL_COMMUNITY)
Admission: EM | Admit: 2020-05-27 | Discharge: 2020-05-31 | DRG: 854 | Disposition: A | Discharge status: Home / Self Care | Payer: Medicare Other | Attending: Internal Medicine | Admitting: Internal Medicine

## 2020-05-27 ENCOUNTER — Encounter (HOSPITAL_COMMUNITY): Payer: Self-pay | Admitting: Emergency Medicine

## 2020-05-27 DIAGNOSIS — Z8249 Family history of ischemic heart disease and other diseases of the circulatory system: Secondary | ICD-10-CM | POA: Diagnosis not present

## 2020-05-27 DIAGNOSIS — D62 Acute posthemorrhagic anemia: Secondary | ICD-10-CM | POA: Diagnosis not present

## 2020-05-27 DIAGNOSIS — K810 Acute cholecystitis: Secondary | ICD-10-CM | POA: Diagnosis present

## 2020-05-27 DIAGNOSIS — K429 Umbilical hernia without obstruction or gangrene: Secondary | ICD-10-CM | POA: Diagnosis present

## 2020-05-27 DIAGNOSIS — S42001D Fracture of unspecified part of right clavicle, subsequent encounter for fracture with routine healing: Secondary | ICD-10-CM

## 2020-05-27 DIAGNOSIS — F5104 Psychophysiologic insomnia: Secondary | ICD-10-CM | POA: Diagnosis present

## 2020-05-27 DIAGNOSIS — Z20822 Contact with and (suspected) exposure to covid-19: Secondary | ICD-10-CM | POA: Diagnosis present

## 2020-05-27 DIAGNOSIS — W1830XD Fall on same level, unspecified, subsequent encounter: Secondary | ICD-10-CM

## 2020-05-27 DIAGNOSIS — K8000 Calculus of gallbladder with acute cholecystitis without obstruction: Secondary | ICD-10-CM | POA: Diagnosis present

## 2020-05-27 DIAGNOSIS — E7800 Pure hypercholesterolemia, unspecified: Secondary | ICD-10-CM | POA: Diagnosis present

## 2020-05-27 DIAGNOSIS — E785 Hyperlipidemia, unspecified: Secondary | ICD-10-CM | POA: Diagnosis present

## 2020-05-27 DIAGNOSIS — Z881 Allergy status to other antibiotic agents status: Secondary | ICD-10-CM | POA: Diagnosis not present

## 2020-05-27 DIAGNOSIS — F32A Depression, unspecified: Secondary | ICD-10-CM | POA: Diagnosis present

## 2020-05-27 DIAGNOSIS — E876 Hypokalemia: Secondary | ICD-10-CM | POA: Diagnosis not present

## 2020-05-27 DIAGNOSIS — F419 Anxiety disorder, unspecified: Secondary | ICD-10-CM | POA: Diagnosis present

## 2020-05-27 DIAGNOSIS — Z7983 Long term (current) use of bisphosphonates: Secondary | ICD-10-CM | POA: Diagnosis not present

## 2020-05-27 DIAGNOSIS — Z79890 Hormone replacement therapy (postmenopausal): Secondary | ICD-10-CM | POA: Diagnosis not present

## 2020-05-27 DIAGNOSIS — R55 Syncope and collapse: Secondary | ICD-10-CM

## 2020-05-27 DIAGNOSIS — M810 Age-related osteoporosis without current pathological fracture: Secondary | ICD-10-CM | POA: Diagnosis present

## 2020-05-27 DIAGNOSIS — K5900 Constipation, unspecified: Secondary | ICD-10-CM | POA: Diagnosis present

## 2020-05-27 DIAGNOSIS — K219 Gastro-esophageal reflux disease without esophagitis: Secondary | ICD-10-CM | POA: Diagnosis present

## 2020-05-27 DIAGNOSIS — I10 Essential (primary) hypertension: Secondary | ICD-10-CM | POA: Diagnosis present

## 2020-05-27 DIAGNOSIS — A419 Sepsis, unspecified organism: Principal | ICD-10-CM | POA: Diagnosis present

## 2020-05-27 DIAGNOSIS — Z79899 Other long term (current) drug therapy: Secondary | ICD-10-CM

## 2020-05-27 DIAGNOSIS — W1830XA Fall on same level, unspecified, initial encounter: Secondary | ICD-10-CM | POA: Diagnosis present

## 2020-05-27 DIAGNOSIS — Z8673 Personal history of transient ischemic attack (TIA), and cerebral infarction without residual deficits: Secondary | ICD-10-CM

## 2020-05-27 DIAGNOSIS — R739 Hyperglycemia, unspecified: Secondary | ICD-10-CM | POA: Diagnosis present

## 2020-05-27 DIAGNOSIS — Z87891 Personal history of nicotine dependence: Secondary | ICD-10-CM

## 2020-05-27 DIAGNOSIS — K82A1 Gangrene of gallbladder in cholecystitis: Secondary | ICD-10-CM | POA: Diagnosis present

## 2020-05-27 HISTORY — DX: Other complications of anesthesia, initial encounter: T88.59XA

## 2020-05-27 LAB — COMPREHENSIVE METABOLIC PANEL
ALT: 22 U/L (ref 0–44)
AST: 28 U/L (ref 15–41)
Albumin: 4 g/dL (ref 3.5–5.0)
Alkaline Phosphatase: 54 U/L (ref 38–126)
Anion gap: 14 (ref 5–15)
BUN: 10 mg/dL (ref 8–23)
CO2: 21 mmol/L — ABNORMAL LOW (ref 22–32)
Calcium: 9.6 mg/dL (ref 8.9–10.3)
Chloride: 100 mmol/L (ref 98–111)
Creatinine, Ser: 0.58 mg/dL (ref 0.44–1.00)
GFR, Estimated: >60 mL/min (ref >60)
Glucose, Bld: 170 mg/dL — ABNORMAL HIGH (ref 70–99)
Potassium: 3.8 mmol/L (ref 3.5–5.1)
Sodium: 135 mmol/L (ref 135–145)
Total Bilirubin: 0.7 mg/dL (ref 0.3–1.2)
Total Protein: 7.4 g/dL (ref 6.5–8.1)

## 2020-05-27 LAB — CBC
HCT: 39.6 % (ref 36.0–46.0)
Hemoglobin: 13.2 g/dL (ref 12.0–15.0)
MCH: 33.1 pg (ref 26.0–34.0)
MCHC: 33.3 g/dL (ref 30.0–36.0)
MCV: 99.2 fL (ref 80.0–100.0)
Platelets: 255 10*3/uL (ref 150–400)
RBC: 3.99 MIL/uL (ref 3.87–5.11)
RDW: 11.9 % (ref 11.5–15.5)
WBC: 17.8 10*3/uL — ABNORMAL HIGH (ref 4.0–10.5)
nRBC: 0 % (ref 0.0–0.2)

## 2020-05-27 LAB — RESP PANEL BY RT-PCR (FLU A&B, COVID) ARPGX2
Influenza A by PCR: NEGATIVE
Influenza B by PCR: NEGATIVE
SARS Coronavirus 2 by RT PCR: NEGATIVE

## 2020-05-27 LAB — URINALYSIS, ROUTINE W REFLEX MICROSCOPIC
Bacteria, UA: NONE SEEN
Bilirubin Urine: NEGATIVE
Glucose, UA: NEGATIVE mg/dL
Ketones, ur: NEGATIVE mg/dL
Leukocytes,Ua: NEGATIVE
Nitrite: NEGATIVE
Protein, ur: NEGATIVE mg/dL
Specific Gravity, Urine: 1.027 (ref 1.005–1.030)
pH: 7 (ref 5.0–8.0)

## 2020-05-27 LAB — CBG MONITORING, ED
Glucose-Capillary: 123 mg/dL — ABNORMAL HIGH (ref 70–99)
Glucose-Capillary: 99 mg/dL (ref 70–99)

## 2020-05-27 LAB — PROCALCITONIN: Procalcitonin: 0.53 ng/mL

## 2020-05-27 LAB — LIPASE, BLOOD: Lipase: 25 U/L (ref 11–51)

## 2020-05-27 LAB — LACTIC ACID, PLASMA: Lactic Acid, Venous: 1.3 mmol/L (ref 0.5–1.9)

## 2020-05-27 MED ORDER — INSULIN ASPART 100 UNIT/ML ~~LOC~~ SOLN: 0.0000 [IU] | SUBCUTANEOUS | Status: DC

## 2020-05-27 MED ORDER — MELATONIN 5 MG PO TABS
5.0000 mg | ORAL_TABLET | Freq: Every day | ORAL | Status: DC
  Administered 2020-05-28 – 2020-05-30 (×3): 5 mg via ORAL
  Filled 2020-05-27 (×5): qty 1

## 2020-05-27 MED ORDER — ONDANSETRON HCL 4 MG/2ML IJ SOLN
4.0000 mg | Freq: Four times a day (QID) | INTRAMUSCULAR | Status: DC | PRN
  Administered 2020-05-28: 4 mg via INTRAVENOUS
  Filled 2020-05-27: qty 2

## 2020-05-27 MED ORDER — PANTOPRAZOLE SODIUM 40 MG PO TBEC
40.0000 mg | DELAYED_RELEASE_TABLET | Freq: Two times a day (BID) | ORAL | Status: DC
  Administered 2020-05-28 – 2020-05-31 (×7): 40 mg via ORAL
  Filled 2020-05-27 (×7): qty 1

## 2020-05-27 MED ORDER — SODIUM CHLORIDE 0.9 % IV SOLN: 10.0000 mL/h | Freq: Once | INTRAVENOUS | Status: DC

## 2020-05-27 MED ORDER — ASCORBIC ACID 500 MG PO TABS
500.0000 mg | ORAL_TABLET | Freq: Every day | ORAL | Status: DC
  Administered 2020-05-29 – 2020-05-31 (×3): 500 mg via ORAL
  Filled 2020-05-27 (×3): qty 1

## 2020-05-27 MED ORDER — PIPERACILLIN-TAZOBACTAM 3.375 G IVPB 30 MIN: 3.3750 g | Freq: Three times a day (TID) | INTRAVENOUS | Status: DC

## 2020-05-27 MED ORDER — VENLAFAXINE HCL ER 75 MG PO CP24
150.0000 mg | ORAL_CAPSULE | Freq: Every day | ORAL | Status: DC
  Administered 2020-05-28 – 2020-05-31 (×4): 150 mg via ORAL
  Filled 2020-05-27: qty 1
  Filled 2020-05-27 (×3): qty 2

## 2020-05-27 MED ORDER — PRAVASTATIN SODIUM 40 MG PO TABS
40.0000 mg | ORAL_TABLET | Freq: Every day | ORAL | Status: DC
  Administered 2020-05-29 – 2020-05-31 (×3): 40 mg via ORAL
  Filled 2020-05-27 (×3): qty 1

## 2020-05-27 MED ORDER — PIPERACILLIN-TAZOBACTAM 3.375 G IVPB 30 MIN
3.3750 g | Freq: Once | INTRAVENOUS | Status: AC
  Administered 2020-05-27: 3.375 g via INTRAVENOUS
  Filled 2020-05-27: qty 50

## 2020-05-27 MED ORDER — MORPHINE SULFATE (PF) 4 MG/ML IV SOLN
4.0000 mg | Freq: Once | INTRAVENOUS | Status: AC
  Administered 2020-05-27: 4 mg via INTRAVENOUS
  Filled 2020-05-27: qty 1

## 2020-05-27 MED ORDER — VITAMIN B-12 1000 MCG PO TABS
1000.0000 ug | ORAL_TABLET | Freq: Every day | ORAL | Status: DC
  Administered 2020-05-29 – 2020-05-31 (×3): 1000 ug via ORAL
  Filled 2020-05-27 (×3): qty 1

## 2020-05-27 MED ORDER — VITAMIN D 25 MCG (1000 UNIT) PO TABS
1000.0000 [IU] | ORAL_TABLET | Freq: Every day | ORAL | Status: DC
  Administered 2020-05-29 – 2020-05-31 (×3): 1000 [IU] via ORAL
  Filled 2020-05-27 (×3): qty 1

## 2020-05-27 MED ORDER — LACTATED RINGERS IV BOLUS (SEPSIS)
1000.0000 mL | Freq: Once | INTRAVENOUS | Status: AC
  Administered 2020-05-27: 1000 mL via INTRAVENOUS

## 2020-05-27 MED ORDER — ENOXAPARIN SODIUM 40 MG/0.4ML ~~LOC~~ SOLN: 40.0000 mg | Freq: Every day | SUBCUTANEOUS | Status: DC

## 2020-05-27 MED ORDER — LACTATED RINGERS IV SOLN: INTRAVENOUS | Status: DC

## 2020-05-27 MED ORDER — PIPERACILLIN-TAZOBACTAM 3.375 G IVPB
3.3750 g | Freq: Three times a day (TID) | INTRAVENOUS | Status: DC
  Administered 2020-05-28: 3.375 g via INTRAVENOUS
  Filled 2020-05-27: qty 50

## 2020-05-27 MED ORDER — HYDROXYZINE HCL 10 MG PO TABS
5.0000 mg | ORAL_TABLET | Freq: Every evening | ORAL | Status: DC | PRN
  Filled 2020-05-27: qty 1

## 2020-05-27 MED ORDER — PIPERACILLIN-TAZOBACTAM 3.375 G IVPB: 3.3750 g | Freq: Two times a day (BID) | INTRAVENOUS | Status: DC

## 2020-05-27 MED ORDER — ADULT MULTIVITAMIN W/MINERALS CH
1.0000 | ORAL_TABLET | Freq: Every day | ORAL | Status: DC
  Administered 2020-05-29 – 2020-05-31 (×3): 1 via ORAL
  Filled 2020-05-27 (×3): qty 1

## 2020-05-27 MED ORDER — SODIUM CHLORIDE 0.9 % IV SOLN: 2.0000 g | Freq: Once | INTRAVENOUS | Status: DC

## 2020-05-27 MED ORDER — IOHEXOL 300 MG/ML  SOLN
100.0000 mL | Freq: Once | INTRAMUSCULAR | Status: AC | PRN
  Administered 2020-05-27: 100 mL via INTRAVENOUS

## 2020-05-27 MED ORDER — ACETAMINOPHEN 325 MG PO TABS
650.0000 mg | ORAL_TABLET | Freq: Four times a day (QID) | ORAL | Status: DC | PRN
  Administered 2020-05-27 – 2020-05-28 (×2): 650 mg via ORAL
  Filled 2020-05-27 (×2): qty 2

## 2020-05-27 NOTE — ED Notes (Signed)
Pt transferred to hospital bed for comfort.

## 2020-05-27 NOTE — ED Triage Notes (Signed)
Pt to triage via [facility] from home.  Reports generalized abd pain worse on right side since midnight with nausea and vomiting.  20 g IV LAC and Zofran 4mg  IV given PTA with some relief of nausea.

## 2020-05-27 NOTE — Consult Note (Signed)
Reason for Consult/Chief Complaint: cholecystitis Consultant: Laveda Norman, [place]  Phyllis [name] is an 79 y.o. female.   HPI: 36F presents with abdominal pain that began around midnight last night, associated with one episode of nausea and vomiting. Localizes pain to the RLQ. Denies pain like this in the past. Had a day or so of no BM, which is abnormal, but had a BM this AM, larger in volume than usual, but normal in color and quality. Also reports pain to the R shoulder, however she also reports a fall on 05/23/2020 after leaning on an unstable railing.   Past Medical History:  Diagnosis Date  . Abdominal pain, unspecified site   . Acute sinusitis, unspecified   . Contact dermatitis and other eczema due to plants (except food)   . Cough   . Cramp of limb   . Depression   . Depressive disorder, not elsewhere classified   . Essential hypertension, benign   . Headache(784.0)   . High cholesterol   . Hypertension   . Lumbago   . Migraine   . Observation for suspected cardiovascular disease   . Osteoporosis, unspecified   . Other and unspecified hyperlipidemia   . Rash and other nonspecific skin eruption   . Rectocele   . Reflux esophagitis   . Unspecified constipation     Past Surgical History:  Procedure Laterality Date  . APPENDECTOMY    . BREAST BIOPSY     Percutaneous needle core  . ESOPHAGOGASTRODUODENOSCOPY (EGD) WITH PROPOFOL N/A 03/08/2018   Procedure: ESOPHAGOGASTRODUODENOSCOPY (EGD) WITH PROPOFOL;  Surgeon: Willis Modena, MD;  Location: [facility] ENDOSCOPY;  Service: Endoscopy;  Laterality: N/A;    Family History  Problem Relation Age of Onset  . Hypertension Other   . Coronary artery disease Other   . Heart disease Mother   . ADD / ADHD Son     Social History:  reports that she quit smoking about 34 years ago. Her smoking use included cigarettes. She has a 24.00 pack-year smoking history. She has never used smokeless tobacco. She reports that she does not drink alcohol  and does not use drugs.  Allergies:  Allergies  Allergen Reactions  . Azithromycin     Developed a rash right after she quit taking it    Medications: I have reviewed the patient's current medications.  Results for orders placed or performed during the hospital encounter of 05/27/20 (from the past 48 hour(s))  Lipase, blood     Status: None   Collection Time: 05/27/20 11:02 AM  Result Value Ref Range   Lipase 25 11 - 51 U/L    Comment: Performed at [facility], [address] [place], [place] [address]  Comprehensive metabolic panel     Status: Abnormal   Collection Time: 05/27/20 11:02 AM  Result Value Ref Range   Sodium 135 135 - 145 mmol/L   Potassium 3.8 3.5 - 5.1 mmol/L   Chloride 100 98 - 111 mmol/L   CO2 21 (L) 22 - 32 mmol/L   Glucose, Bld 170 (H) 70 - 99 mg/dL    Comment: Glucose reference range applies only to samples taken after fasting for at least 8 hours.   BUN 10 8 - 23 mg/dL   Creatinine, Ser 6.64 0.44 - 1.00 mg/dL   Calcium 9.6 8.9 - 40.3 mg/dL   Total Protein 7.4 6.5 - 8.1 g/dL   Albumin 4.0 3.5 - 5.0 g/dL   AST 28 15 - 41 U/L  ALT 22 0 - 44 U/L   Alkaline Phosphatase 54 38 - 126 U/L   Total Bilirubin 0.7 0.3 - 1.2 mg/dL   GFR, Estimated >19>60 >14>60 mL/min    Comment: (NOTE) Calculated using the CKD-EPI Creatinine Equation (2021)    Anion gap 14 5 - 15    Comment: Performed at [facility], [address] [place], [place] [address]  CBC     Status: Abnormal   Collection Time: 05/27/20 11:02 AM  Result Value Ref Range   WBC 17.8 (H) 4.0 - 10.5 K/uL   RBC 3.99 3.87 - 5.11 MIL/uL   Hemoglobin 13.2 12.0 - 15.0 g/dL   HCT 62.139.6 36 - 46 %   MCV 99.2 80.0 - 100.0 fL   MCH 33.1 26.0 - 34.0 pg   MCHC 33.3 30.0 - 36.0 g/dL   RDW 30.811.9 65.711.5 - 84.615.5 %   Platelets 255 150 - 400 K/uL   nRBC 0.0 0.0 - 0.2 %    Comment: Performed at [facility], [address] [place], [place] [address]  Resp Panel by RT-PCR (Flu A&B, Covid) Nasopharyngeal  Swab     Status: None   Collection Time: 05/27/20  8:04 PM   Specimen: Nasopharyngeal Swab; Nasopharyngeal(NP) swabs in vial transport medium  Result Value Ref Range   SARS Coronavirus 2 by RT PCR NEGATIVE NEGATIVE    Comment: (NOTE) SARS-CoV-2 target nucleic acids are NOT DETECTED.  The SARS-CoV-2 RNA is generally detectable in upper respiratory specimens during the acute phase of infection. The lowest concentration of SARS-CoV-2 viral copies this assay can detect is 138 copies/mL. A negative result does not preclude SARS-Cov-2 infection and should not be used as the sole basis for treatment or other patient management decisions. A negative result may occur with  improper specimen collection/handling, submission of specimen other than nasopharyngeal swab, presence of viral mutation(s) within the areas targeted by this assay, and inadequate number of viral copies(<138 copies/mL). A negative result must be combined with clinical observations, patient history, and epidemiological information. The expected result is Negative.  Fact Sheet for Patients:  BloggerCourse.comhttps://www.fda.gov/media/152166/download  Fact Sheet for Healthcare Providers:  SeriousBroker.ithttps://www.fda.gov/media/152162/download  This test is no t yet approved or cleared by the [place] FDA and  has been authorized for detection and/or diagnosis of SARS-CoV-2 by FDA under an Emergency Use Authorization (EUA). This EUA will remain  in effect (meaning this test can be used) for the duration of the COVID-19 declaration under Section 564(b)(1) of the Act, 21 U.S.C.section 360bbb-3(b)(1), unless the authorization is terminated  or revoked sooner.       Influenza A by PCR NEGATIVE NEGATIVE   Influenza B by PCR NEGATIVE NEGATIVE    Comment: (NOTE) The Xpert Xpress SARS-CoV-2/FLU/RSV plus assay is intended as an aid in the diagnosis of influenza from Nasopharyngeal swab specimens and should not be used as a sole basis for treatment.  Nasal washings and aspirates are unacceptable for Xpert Xpress SARS-CoV-2/FLU/RSV testing.  Fact Sheet for Patients: BloggerCourse.comhttps://www.fda.gov/media/152166/download  Fact Sheet for Healthcare Providers: SeriousBroker.ithttps://www.fda.gov/media/152162/download  This test is not yet approved or cleared by the [place] FDA and has been authorized for detection and/or diagnosis of SARS-CoV-2 by FDA under an Emergency Use Authorization (EUA). This EUA will remain in effect (meaning this test can be used) for the duration of the COVID-19 declaration under Section 564(b)(1) of the Act, 21 U.S.C. section 360bbb-3(b)(1), unless the authorization is terminated or revoked.  Performed at [facility], [address] [place], [place] [address]   Lactic acid, plasma     Status: None   Collection Time: 05/27/20  8:20 PM  Result Value Ref Range   Lactic Acid, Venous 1.3 0.5 - 1.9 mmol/L    Comment: Performed at [facility], [address], [place], [place] [address]  Urinalysis, Routine w reflex microscopic     Status: Abnormal   Collection Time: 05/27/20  9:40 PM  Result Value Ref Range   Color, Urine YELLOW YELLOW   APPearance CLEAR CLEAR   Specific Gravity, Urine 1.027 1.005 - 1.030   pH 7.0 5.0 - 8.0   Glucose, UA NEGATIVE NEGATIVE mg/dL   Hgb urine dipstick SMALL (A) NEGATIVE   Bilirubin Urine NEGATIVE NEGATIVE   Ketones, ur NEGATIVE NEGATIVE mg/dL   Protein, ur NEGATIVE NEGATIVE mg/dL   Nitrite NEGATIVE NEGATIVE   Leukocytes,Ua NEGATIVE NEGATIVE   RBC / HPF 0-5 0 - 5 RBC/hpf   WBC, UA 0-5 0 - 5 WBC/hpf   Bacteria, UA NONE SEEN NONE SEEN   Mucus PRESENT     Comment: Performed at [facility], [address], [place], [place] [address]  Type and screen [facility]     Status: None (Preliminary result)   Collection Time: 05/27/20  9:41 PM  Result Value Ref Range   ABO/RH(D) PENDING    Antibody Screen PENDING    Sample Expiration       05/30/2020,2359 Performed at [facility], [address], [place], [place] [address]   CBG monitoring, ED     Status: None   Collection Time: 05/27/20  9:54 PM  Result Value Ref Range   Glucose-Capillary 99 70 - 99 mg/dL    Comment: Glucose reference range applies only to samples taken after fasting for at least 8 hours.    CT ABDOMEN PELVIS W CONTRAST  Result Date: 05/27/2020 CLINICAL DATA:  Abdominal pain, generalized but worse on the right. Nausea vomiting. EXAM: CT ABDOMEN AND PELVIS WITH CONTRAST TECHNIQUE: Multidetector CT imaging of the abdomen and pelvis was performed using the standard protocol following bolus administration of intravenous contrast. CONTRAST:  OMNIPAQUE IOHEXOL 300 MG/ML  SOLN COMPARISON:  04/30/2018 FINDINGS: Lower chest: No acute abnormality. Hepatobiliary: Liver normal in size. 5 mm low-attenuation lesion, segment 6. Tiny low-attenuation lesion in segment 7. These are both likely cysts. More definitive cyst in segment 4 B, 1.5 cm. No other liver masses or lesions. Gallbladder is distended. Small amount of fluid attenuation lies between the gallbladder and liver. Small stone in the fundus. Questionable stone in neck/cystic duct. Mild inflammation is seen in the right upper quadrant adjacent to the gallbladder. Trace amount of fluid is seen along the anterior margin of the inferior liver. Prominent common bile duct, 7 mm, with distal tapering, but normal for age. Pancreas: Unremarkable. No pancreatic ductal dilatation or surrounding inflammatory changes. Spleen: Normal in size without focal abnormality. Adrenals/Urinary Tract: No adrenal masses. Kidneys normal in size, orientation and position. 5 mm low-attenuation cortical lesion, midpole the left kidney, consistent with a cyst. No other masses, no stones and no hydronephrosis. Normal ureters. Normal bladder. Stomach/Bowel: Normal stomach and small bowel. Mild to moderate increased colonic stool burden. No colon  wall thickening or inflammation. Vascular/Lymphatic: Aortic atherosclerosis. No aneurysm. No enlarged lymph nodes. Reproductive: Uterus unremarkable. No adnexal masses. Prominent periuterine veins. Other: Small fat containing umbilical hernia. Musculoskeletal: No fracture or acute finding.  No bone  lesion. IMPRESSION: 1. Findings suggest acute cholecystitis. Gallbladder is distended with mild adjacent inflammation. There are gallstones and a small amount of pericholecystic fluid. Consider follow-up right upper quadrant ultrasound more definitive assessment. 2. Trace ascites. 3. No other acute abnormality within the abdomen or pelvis. 4. Aortic atherosclerosis. 5. Mild to moderate increased colonic stool burden. Electronically Signed   By: Amie Portland M.D.   On: 05/27/2020 18:43    ROS 10 point review of systems is negative except as listed above in HPI.   Physical Exam Blood pressure (!) 159/83, pulse (!) 105, temperature (!) 101.2 F (38.4 C), temperature source Oral, resp. rate 17, SpO2 98 %. Constitutional: well-developed, well-nourished HEENT: pupils equal, round, reactive to light, 65mm b/l, moist conjunctiva, external inspection of ears and nose normal, hearing intact Oropharynx: normal oropharyngeal mucosa, normal dentition Neck: no thyromegaly, trachea midline, no midline cervical tenderness to palpation Chest: breath sounds equal bilaterally, normal respiratory effort, no midline or lateral chest wall tenderness to palpation/deformity Abdomen: soft, RUQ TTP, no bruising, no hepatosplenomegaly, open appy scar, small umbilical hernia GU: normal female genitalia  Back: no wounds, no thoracic/lumbar spine tenderness to palpation, no thoracic/lumbar spine stepoffs Rectal: deferred Extremities: 2+ radial and pedal pulses bilaterally, motor and sensation intact to bilateral UE and LE, no peripheral edema MSK: unable to assess gait/station, no clubbing/cyanosis of fingers/toes, normal ROM of all  four extremities Skin: warm, dry, no rashes Psych: normal memory, normal mood/affect    Assessment/Plan: 45F with cholecystitis on CT A/P  Admitted to [facility] already with HIDA ordered, await results. LFTs and lipase normal. Discussed surgical vs non-surgical options with the patient and she is unsure of how to move forward. Will discuss more after HIDA results available and also include her son, who was not available at the time of my exam. Continue abx, pain control.   Diamantina Monks, MD General and Trauma Surgery [facility]

## 2020-05-27 NOTE — ED Notes (Signed)
Blood consent obtained and is at bedside

## 2020-05-27 NOTE — Progress Notes (Signed)
Code Sepsis initiated at 1850 PM, ELINK following.

## 2020-05-27 NOTE — H&P (Addendum)
History and Physical  DAE ANTONUCCI HUD:[id number] DOB: 08-04-1940 DOA: 05/27/2020  Referring physician: Samella Parr, EDP PCP: [name], MD  Outpatient Specialists: Neurology Patient coming from: Home.  Chief Complaint: Right-sided abdominal pain, nausea vomiting.  HPI: Phyllis [name] is a 79 y.o. female with medical history significant for prior appendectomy, LA grade A esophagitis, gastric ulcer, chronic depression/anxiety, insomnia, prior CVA, essential hypertension, hyperlipidemia, GERD, osteoporosis, who presented to [facility] ED with complaints of sudden onset right-sided abdominal pain, associated with nausea, vomiting and poor oral intake of 1 day duration.  No diarrhea.  She initially thought it was related to a mechanical fall she had 4 days ago where she incurred a right clavicular fracture.  She was seen in the ED at that time.  She denies any fevers, chills, chest pain, or dyspnea.  No urinary symptoms.  She presented to the ED for further evaluation and management of her symptomatology.  CT abdomen pelvis with contrast done on 05/27/2020 showed findings suggestive of acute gallstones cholecystitis.  Was started on Zosyn empirically along with IV fluid in the ED.  General surgery consulted by EDP.  TRH asked to admit.  ED Course: T-max 101.2, heart rate 109, respiration rate 27.  Lab studies remarkable for WBC 17.8K.  Will obtain lactic acid, procalcitonin, blood cultures x2 peripherally.   Review of Systems: Review of systems as noted in the HPI. All other systems reviewed and are negative.   Past Medical History:  Diagnosis Date  . Abdominal pain, unspecified site   . Acute sinusitis, unspecified   . Contact dermatitis and other eczema due to plants (except food)   . Cough   . Cramp of limb   . Depression   . Depressive disorder, not elsewhere classified   . Essential hypertension, benign   . Headache(784.0)   . High cholesterol   . Hypertension   . Lumbago   . Migraine    . Observation for suspected cardiovascular disease   . Osteoporosis, unspecified   . Other and unspecified hyperlipidemia   . Rash and other nonspecific skin eruption   . Rectocele   . Reflux esophagitis   . Unspecified constipation    Past Surgical History:  Procedure Laterality Date  . APPENDECTOMY    . BREAST BIOPSY     Percutaneous needle core  . ESOPHAGOGASTRODUODENOSCOPY (EGD) WITH PROPOFOL N/A 03/08/2018   Procedure: ESOPHAGOGASTRODUODENOSCOPY (EGD) WITH PROPOFOL;  Surgeon: Willis Modena, MD;  Location[facility] END[facility]COPY;  Service: Endoscopy;  Laterality: N/A;    Social History:  reports that she quit smoking about 34 years ago. Her smoking use included cigarettes. She has a 24.00 pack-year smoking history. She has never used smokeless tobacco. She reports that she does not drink alcohol and does not use drugs.   Allergies  Allergen Reactions  . Azithromycin     Developed a rash right after she quit taking it    Family History  Problem Relation Age of Onset  . Hypertension Other   . Coronary artery disease Other   . Heart disease Mother   . ADD / ADHD Son       Prior to Admission medications   Medication Sig Start Date End Date Taking? Authorizing Provider  alendronate (FOSAMAX) 70 MG tablet Take 70 mg by mouth every Saturday. 01/12/18   [provider]  clotrimazole-betamethasone (LOTRISONE) cream Apply 1 application topically daily as needed.    [provider]  diclofenac sodium (VOLTAREN) 1 % GEL Apply 2  g topically 4 (four) times daily as needed (as needed for neck pain.). Apply at the base of the back of the neck. 03/10/18   Arrien, York Ram, MD  hydrOXYzine (ATARAX/VISTARIL) 10 MG tablet Take 10 mg by mouth 3 (three) times daily as needed for itching.     [provider]  LORazepam (ATIVAN) 0.5 MG tablet Take 0.5-1 mg by mouth daily as needed for anxiety.    [provider]  losartan (COZAAR) 50 MG tablet Take 50 mg by  mouth daily.    [provider]  Melatonin 5 MG TABS Take 5 mg by mouth at bedtime.    [provider]  Multiple Vitamin (MULTIVITAMIN) tablet Take 1 tablet by mouth daily.    [provider]  pantoprazole (PROTONIX) 40 MG tablet Take 1 tablet (40 mg total) by mouth 2 (two) times daily. 03/10/18 04/09/18  Arrien, York Ram, MD  pravastatin (PRAVACHOL) 40 MG tablet Take 40 mg by mouth daily.    [provider]  venlafaxine XR (EFFEXOR-XR) 150 MG 24 hr capsule Take 150 mg by mouth daily with breakfast.    [provider]  vitamin B-12 (CYANOCOBALAMIN) 1000 MCG tablet Take 1,000 mcg by mouth daily.    [provider]    Physical Exam: BP (!) 161/70   Pulse (!) 105   Temp (!) 101.2 F (38.4 C) (Oral)   Resp (!) 27   SpO2 100%   . General: 79 y.o. year-old female well developed well nourished in no acute distress.  Alert and oriented x3. . Cardiovascular: Regular rate and rhythm with no rubs or gallops.  No thyromegaly or JVD noted.  No lower extremity edema. 2/4 pulses in all 4 extremities. Marland Kitchen Respiratory: Clear to auscultation with no wheezes or rales. Good inspiratory effort. . Abdomen: Soft right upper quadrant tenderness with palpation with normal bowel sounds x4 quadrants. . Muskuloskeletal: No cyanosis, clubbing or edema noted bilaterally . Neuro: CN II-XII intact, strength, sensation, reflexes . Skin: No ulcerative lesions noted or rashes . Psychiatry: Judgement and insight appear normal. Mood is appropriate for condition and setting          Labs on Admission:  Basic Metabolic Panel: Recent Labs  Lab 05/27/20 1102  NA 135  K 3.8  CL 100  CO2 21*  GLUCOSE 170*  BUN 10  CREATININE 0.58  CALCIUM 9.6   Liver Function Tests: Recent Labs  Lab 05/27/20 1102  AST 28  ALT 22  ALKPHOS 54  BILITOT 0.7  PROT 7.4  ALBUMIN 4.0   Recent Labs  Lab 05/27/20 1102  LIPASE 25   No results for input(s): AMMONIA in the  last 168 hours. CBC: Recent Labs  Lab 05/27/20 1102  WBC 17.8*  HGB 13.2  HCT 39.6  MCV 99.2  PLT 255   Cardiac Enzymes: No results for input(s): CKTOTAL, CKMB, CKMBINDEX, TROPONINI in the last 168 hours.  BNP (last 3 results) No results for input(s): BNP in the last 8760 hours.  ProBNP (last 3 results) No results for input(s): PROBNP in the last 8760 hours.  CBG: No results for input(s): GLUCAP in the last 168 hours.  Radiological Exams on Admission: CT ABDOMEN PELVIS W CONTRAST  Result Date: 05/27/2020 CLINICAL DATA:  Abdominal pain, generalized but worse on the right. Nausea vomiting. EXAM: CT ABDOMEN AND PELVIS WITH CONTRAST TECHNIQUE: Multidetector CT imaging of the abdomen and pelvis was performed using the standard protocol following bolus administration of intravenous contrast. CONTRAST:  100mL OMNIPAQUE IOHEXOL 300 MG/ML  SOLN COMPARISON:  04/30/2018 FINDINGS: Lower chest: No acute abnormality. Hepatobiliary: Liver normal in size. 5 mm low-attenuation lesion, segment 6. Tiny low-attenuation lesion in segment 7. These are both likely cysts. More definitive cyst in segment 4 B, 1.5 cm. No other liver masses or lesions. Gallbladder is distended. Small amount of fluid attenuation lies between the gallbladder and liver. Small stone in the fundus. Questionable stone in neck/cystic duct. Mild inflammation is seen in the right upper quadrant adjacent to the gallbladder. Trace amount of fluid is seen along the anterior margin of the inferior liver. Prominent common bile duct, 7 mm, with distal tapering, but normal for age. Pancreas: Unremarkable. No pancreatic ductal dilatation or surrounding inflammatory changes. Spleen: Normal in size without focal abnormality. Adrenals/Urinary Tract: No adrenal masses. Kidneys normal in size, orientation and position. 5 mm low-attenuation cortical lesion, midpole the left kidney, consistent with a cyst. No other masses, no stones and no hydronephrosis.  Normal ureters. Normal bladder. Stomach/Bowel: Normal stomach and small bowel. Mild to moderate increased colonic stool burden. No colon wall thickening or inflammation. Vascular/Lymphatic: Aortic atherosclerosis. No aneurysm. No enlarged lymph nodes. Reproductive: Uterus unremarkable. No adnexal masses. Prominent periuterine veins. Other: Small fat containing umbilical hernia. Musculoskeletal: No fracture or acute finding.  No bone lesion. IMPRESSION: 1. Findings suggest acute cholecystitis. Gallbladder is distended with mild adjacent inflammation. There are gallstones and a small amount of pericholecystic fluid. Consider follow-up right upper quadrant ultrasound more definitive assessment. 2. Trace ascites. 3. No other acute abnormality within the abdomen or pelvis. 4. Aortic atherosclerosis. 5. Mild to moderate increased colonic stool burden. Electronically Signed   By: Amie Portlandavid  Ormond M.D.   On: 05/27/2020 18:43    EKG: I independently viewed the EKG done and my findings are as followed: Not available at the time of this visit.  Assessment/Plan Present on Admission: . Acute cholecystitis  Active Problems:   Acute cholecystitis  Sepsis secondary to acute gallstone cholecystitis Presented with T-max 101.2, heart rate 109, respiration rate 27, WBC 17.8K.  Lipase level and LFTs were normal Tylenol as needed for fever and mild to moderate pain Ordered HIDA scan, follow results-hold off narcotics for now in anticipation for HIDA scan. Obtain lactic acid, procalcitonin, blood cultures x2 peripherally.  MRSA screen Continue IV Zosyn empirically Continue lactated Ringer at 150 cc/h Hold off home oral antihypertensives and iron supplements in the setting of sepsis Maintain MAP greater than 65 General surgery has been consulted N.p.o. until seen by general surgery Monitor fever curve and WBC Obtain CBC with differentials in the morning  Tachycardia in the setting of sepsis Obtain twelve-lead EKG,  TSH Continue IV fluid hydration Monitor on telemetry  Hyperglycemia, no known history of diabetes Presented with serum glucose 170 Obtain hemoglobin A1c Start insulin sliding scale Monitor CBGs and avoid hypoglycemia  History of LA grade A esophagitis/gastric ulcer Resume home p.o. Protonix 40 mg twice daily Hemoglobin is currently stable at 13.2K No overt bleeding at this time Monitor H&H  Essential hypertension BP stable Hold off home oral antihypertensives due to sepsis Monitor blood pressure, maintain MAP greater than 65  History of CVA She is not on antiplatelets Resume home Pravachol  Chronic anxiety/depression Resume home regimen  Hyperlipidemia LFTs unremarkable Repeat CMP in the morning Resume home pravastatin  Chronic insomnia Resume home regimen      DVT prophylaxis: Subcu Lovenox daily  Code Status: Full code per the patient herself.  Family Communication: None at  bedside.  Disposition Plan: Admit to telemetry surgical.  Consults called: General surgery consulted by EDP  Admission status: Inpatient.  Patient will require at least 2 midnights for further evaluation and treatment of present condition.   Status is: Inpatient    Dispo:  Patient From: Home  Planned Disposition: Home  Expected discharge date: 05/29/20  Medically stable for discharge: No, ongoing management of sepsis secondary to acute gallstones Cholecystitis.        Darlin Drop MD [facility] Pager [phone]  If 7PM-7AM, please contact night-coverage [email or web address] Password [facility]  05/27/2020, 8:26 PM

## 2020-05-27 NOTE — ED Notes (Signed)
Pt was able to ambulate with one assist to bathroom. Pt gait unstable with a recent fall at home.

## 2020-05-27 NOTE — ED Provider Notes (Signed)
[facility] EMERGENCY DEPARTMENT Provider Note   CSN: [id number] Arrival date & time: 05/27/20  1046     History Chief Complaint  Patient presents with  . Abdominal Pain    Phyllis [name] is a 79 y.o. female.  The history is provided by the patient and medical records. No language interpreter was used.  Abdominal Pain    79 year old female significant history of hypertension, GERD, and prior appendectomy brought here via EMS from home for evaluation of abdominal pain.  Patient report developing abdominal pain since last night.  She described pain as a soreness sensation "as if someone punched my stomach" 6 out of 10, happens sporadically sometimes worse with movement and localized primarily on the right side of abdomen.  Pain is not associate with fever or chills no chest pain or shortness of breath no dysuria bowel bladder changes.  She did report having a mechanical fall 4 days ago when she leaned over a railing and the railing tipped causing her to fall over.  She did injure her right shoulder and her right hip in the process and she was wondering if that has anything to do with her abdominal pain.  She was seen in the ED for her previous injury and was diagnosed with right clavicular fracture and is currently using a sling.  She did try to taking Tylenol that was prescribed with some mild improvement of her pain.  She denies any dysuria.  Past Medical History:  Diagnosis Date  . Abdominal pain, unspecified site   . Acute sinusitis, unspecified   . Contact dermatitis and other eczema due to plants (except food)   . Cough   . Cramp of limb   . Depression   . Depressive disorder, not elsewhere classified   . Essential hypertension, benign   . Headache(784.0)   . High cholesterol   . Hypertension   . Lumbago   . Migraine   . Observation for suspected cardiovascular disease   . Osteoporosis, unspecified   . Other and unspecified hyperlipidemia   . Rash and  other nonspecific skin eruption   . Rectocele   . Reflux esophagitis   . Unspecified constipation     Patient Active Problem List   Diagnosis Date Noted  . Essential hypertension, benign 03/08/2018  . Reflux esophagitis 03/08/2018  . Transient hypotension 03/08/2018  . Acute upper GI bleeding 03/08/2018  . Symptomatic anemia 03/08/2018  . Near syncope   . Attention deficit disorder 01/06/2018  . Cognitive change 06/11/2017  . 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 APPENDECTOMY    . BREAST BIOPSY     Percutaneous needle core  . ESOPHAGOGASTRODUODENOSCOPY (EGD) WITH PROPOFOL N/A 03/08/2018   Procedure: ESOPHAGOGASTRODUODENOSCOPY (EGD) WITH PROPOFOL;  Surgeon: Willis Modena, MD;  Location: [facility] ENDOSCOPY;  Service: Endoscopy;  Laterality: N/A;     OB History   No obstetric history on file.     Family History  Problem Relation Age of Onset  . Hypertension Other   . Coronary artery disease Other   . Heart disease Mother   . ADD / ADHD Son     Social History   Tobacco Use  . Smoking status: Former Smoker    Packs/day: 2.00    Years: 12.00    Pack years: 24.00    Types: Cigarettes    Quit date: 06/24/1985    Years since quitting: 34.9  . Smokeless tobacco: Never  Used  Vaping Use  . Vaping Use: Never used  Substance Use Topics  . Alcohol use: No  . Drug use: No    Home Medications Prior to Admission medications   Medication Sig Start Date End Date Taking? Authorizing Provider  alendronate (FOSAMAX) 70 MG tablet Take 70 mg by mouth every Saturday. 01/12/18   [provider]  clotrimazole-betamethasone (LOTRISONE) cream Apply 1 application topically daily as needed.    [provider]  diclofenac sodium (VOLTAREN) 1 % GEL Apply 2 g topically 4 (four) times daily as needed (as needed for neck pain.). Apply at the base of the back of the neck. 03/10/18   Arrien, York Ram, MD    hydrOXYzine (ATARAX/VISTARIL) 10 MG tablet Take 10 mg by mouth 3 (three) times daily as needed for itching.     [provider]  LORazepam (ATIVAN) 0.5 MG tablet Take 0.5-1 mg by mouth daily as needed for anxiety.    [provider]  losartan (COZAAR) 50 MG tablet Take 50 mg by mouth daily.    [provider]  Melatonin 5 MG TABS Take 5 mg by mouth at bedtime.    [provider]  Multiple Vitamin (MULTIVITAMIN) tablet Take 1 tablet by mouth daily.    [provider]  pantoprazole (PROTONIX) 40 MG tablet Take 1 tablet (40 mg total) by mouth 2 (two) times daily. 03/10/18 04/09/18  Arrien, York Ram, MD  pravastatin (PRAVACHOL) 40 MG tablet Take 40 mg by mouth daily.    [provider]  venlafaxine XR (EFFEXOR-XR) 150 MG 24 hr capsule Take 150 mg by mouth daily with breakfast.    [provider]  vitamin B-12 (CYANOCOBALAMIN) 1000 MCG tablet Take 1,000 mcg by mouth daily.    [provider]    Allergies    Azithromycin  Review of Systems   Review of Systems  Gastrointestinal: Positive for abdominal pain.  All other systems reviewed and are negative.   Physical Exam Updated Vital Signs BP (!) 161/70   Pulse (!) 105   Temp (!) 101.2 F (38.4 C) (Oral)   Resp (!) 27   SpO2 100%   Physical Exam Vitals and nursing note reviewed.  Constitutional:      General: She is not in acute distress.    Appearance: She is well-developed.  HENT:     Head: Atraumatic.  Eyes:     Conjunctiva/sclera: Conjunctivae normal.  Cardiovascular:     Rate and Rhythm: Tachycardia present.     Heart sounds: Normal heart sounds. No murmur heard.  No friction rub.  Pulmonary:     Effort: Pulmonary effort is normal.     Breath sounds: Normal breath sounds. No wheezing, rhonchi or rales.  Abdominal:     General: Abdomen is flat.     Palpations: Abdomen is soft.     Tenderness: There is abdominal tenderness in the right upper  quadrant, right lower quadrant and periumbilical area. There is no guarding or rebound.     Hernia: No hernia is present.  Musculoskeletal:        General: Tenderness (Right shoulder: Tenderness noted to [facility] joint with surrounding ecchymosis noted.) present.     Cervical back: Neck supple.  Skin:    Findings: No rash.  Neurological:     Mental Status: She is alert and oriented to person, place, and time.  Psychiatric:        Mood and Affect: Mood normal.     ED  Results / Procedures / Treatments   Labs (all labs ordered are listed, but only abnormal results are displayed) Labs Reviewed  COMPREHENSIVE METABOLIC PANEL - Abnormal; Notable for the following components:      Result Value   CO2 21 (*)    Glucose, Bld 170 (*)    All other components within normal limits  CBC - Abnormal; Notable for the following components:   WBC 17.8 (*)    All other components within normal limits  CULTURE, BLOOD (SINGLE)  RESP PANEL BY RT-PCR (FLU A&B, COVID) ARPGX2  LIPASE, BLOOD  URINALYSIS, ROUTINE W REFLEX MICROSCOPIC    EKG None  Radiology CT ABDOMEN PELVIS W CONTRAST  Result Date: 05/27/2020 CLINICAL DATA:  Abdominal pain, generalized but worse on the right. Nausea vomiting. EXAM: CT ABDOMEN AND PELVIS WITH CONTRAST TECHNIQUE: Multidetector CT imaging of the abdomen and pelvis was performed using the standard protocol following bolus administration of intravenous contrast. CONTRAST:  100mL OMNIPAQUE IOHEXOL 300 MG/ML  SOLN COMPARISON:  04/30/2018 FINDINGS: Lower chest: No acute abnormality. Hepatobiliary: Liver normal in size. 5 mm low-attenuation lesion, segment 6. Tiny low-attenuation lesion in segment 7. These are both likely cysts. More definitive cyst in segment 4 B, 1.5 cm. No other liver masses or lesions. Gallbladder is distended. Small amount of fluid attenuation lies between the gallbladder and liver. Small stone in the fundus. Questionable stone in neck/cystic duct. Mild inflammation  is seen in the right upper quadrant adjacent to the gallbladder. Trace amount of fluid is seen along the anterior margin of the inferior liver. Prominent common bile duct, 7 mm, with distal tapering, but normal for age. Pancreas: Unremarkable. No pancreatic ductal dilatation or surrounding inflammatory changes. Spleen: Normal in size without focal abnormality. Adrenals/Urinary Tract: No adrenal masses. Kidneys normal in size, orientation and position. 5 mm low-attenuation cortical lesion, midpole the left kidney, consistent with a cyst. No other masses, no stones and no hydronephrosis. Normal ureters. Normal bladder. Stomach/Bowel: Normal stomach and small bowel. Mild to moderate increased colonic stool burden. No colon wall thickening or inflammation. Vascular/Lymphatic: Aortic atherosclerosis. No aneurysm. No enlarged lymph nodes. Reproductive: Uterus unremarkable. No adnexal masses. Prominent periuterine veins. Other: Small fat containing umbilical hernia. Musculoskeletal: No fracture or acute finding.  No bone lesion. IMPRESSION: 1. Findings suggest acute cholecystitis. Gallbladder is distended with mild adjacent inflammation. There are gallstones and a small amount of pericholecystic fluid. Consider follow-up right upper quadrant ultrasound more definitive assessment. 2. Trace ascites. 3. No other acute abnormality within the abdomen or pelvis. 4. Aortic atherosclerosis. 5. Mild to moderate increased colonic stool burden. Electronically Signed   By: Amie Portlandavid  Ormond M.D.   On: 05/27/2020 18:43    Procedures .Critical Care Performed by: Fayrene Helperran, Azka Steger, PA-C Authorized by: Fayrene Helperran, Brantleigh Mifflin, PA-C   Critical care provider statement:    Critical care time (minutes):  35   Critical care was time spent personally by me on the following activities:  Discussions with consultants, evaluation of patient's response to treatment, examination of patient, ordering and performing treatments and interventions, ordering and  review of laboratory studies, ordering and review of radiographic studies, pulse oximetry, re-evaluation of patient's condition, obtaining history from patient or surrogate and review of old charts   (including critical care time)  Medications Ordered in ED Medications  lactated ringers infusion (has no administration in time range)  lactated ringers bolus 1,000 mL (1,000 mLs Intravenous New Bag/Given 05/27/20 1935)    And  lactated ringers bolus 1,000 mL (1,000 mLs  Intravenous New Bag/Given 05/27/20 1935)  piperacillin-tazobactam (ZOSYN) IVPB 3.375 g (has no administration in time range)  morphine 4 MG/ML injection 4 mg (4 mg Intravenous Given 05/27/20 1608)  iohexol (OMNIPAQUE) 300 MG/ML solution 100 mL (100 mLs Intravenous Contrast Given 05/27/20 1811)    ED Course  I have reviewed the triage vital signs and the nursing notes.  Pertinent labs & imaging results that were available during my care of the patient were reviewed by me and considered in my medical decision making (see chart for details).    MDM Rules/Calculators/A&P                          BP (!) 161/70   Pulse (!) 105   Temp (!) 101.2 F (38.4 C) (Oral)   Resp (!) 27   SpO2 100%   Final Clinical Impression(s) / ED Diagnoses Final diagnoses:  Acute cholecystitis  Sepsis, due to unspecified organism, unspecified whether acute organ dysfunction present (HCC)    Rx / DC Orders ED Discharge Orders    None     4:01 PM Patient here with abdominal pain since yesterday.  Pain is reproducible to the right side of the abdomen.  This warranted additional evaluation including abdominal pelvis CT scan.  She does have an elevated white count of 17.8.  She was also previously evaluated in the ED several days prior when she had a mechanical fall and suffered a closed right clavicular fracture.  6:52 PM Patient now has a temperature of 101.2.,  Elevated white count of 17.8, is tachycardic with heart rate of 105 and tachypneic  with a respiratory of 27 but fortunately no hypoxia.  CT scan of the abdomen pelvis with finding suggestive of acute cholecystitis as gallbladder is distended with mild adjacent inflammation as well as multiple gallstones.  6:54 PM Appreciate consultation to general surgeon Dr. Bedelia Person who request medicine for admission and she will be involved in patient care.  Care discussed with Dr. Denton Lank.    7:46 PM I have consulted [facility] Dr. Margo Aye who agrees to see and will admit pt for further care.  Pt is aware and agrees with plan. Sepsis reassessment done.    Fayrene Helper, PA-C 05/27/20 1948    Cathren Laine, MD 05/27/20 2128

## 2020-05-28 ENCOUNTER — Inpatient Hospital Stay (HOSPITAL_COMMUNITY): Payer: Medicare Other | Admitting: Certified Registered Nurse Anesthetist

## 2020-05-28 ENCOUNTER — Encounter (HOSPITAL_COMMUNITY): Payer: Self-pay | Admitting: Internal Medicine

## 2020-05-28 ENCOUNTER — Encounter (HOSPITAL_COMMUNITY)
Admission: EM | Disposition: A | Discharge status: Home / Self Care | Payer: Self-pay | Source: Home / Self Care | Attending: Family Medicine

## 2020-05-28 HISTORY — PX: CHOLECYSTECTOMY: SHX55

## 2020-05-28 LAB — MRSA PCR SCREENING: MRSA by PCR: NEGATIVE

## 2020-05-28 LAB — CBC WITH DIFFERENTIAL/PLATELET
Abs Immature Granulocytes: 0.08 10*3/uL — ABNORMAL HIGH (ref 0.00–0.07)
Basophils Absolute: 0 10*3/uL (ref 0.0–0.1)
Basophils Relative: 0 %
Eosinophils Absolute: 0 10*3/uL (ref 0.0–0.5)
Eosinophils Relative: 0 %
HCT: 35.7 % — ABNORMAL LOW (ref 36.0–46.0)
Hemoglobin: 11.8 g/dL — ABNORMAL LOW (ref 12.0–15.0)
Immature Granulocytes: 1 %
Lymphocytes Relative: 4 %
Lymphs Abs: 0.7 10*3/uL (ref 0.7–4.0)
MCH: 32.5 pg (ref 26.0–34.0)
MCHC: 33.1 g/dL (ref 30.0–36.0)
MCV: 98.3 fL (ref 80.0–100.0)
Monocytes Absolute: 0.7 10*3/uL (ref 0.1–1.0)
Monocytes Relative: 5 %
Neutro Abs: 14.6 10*3/uL — ABNORMAL HIGH (ref 1.7–7.7)
Neutrophils Relative %: 90 %
Platelets: 223 10*3/uL (ref 150–400)
RBC: 3.63 MIL/uL — ABNORMAL LOW (ref 3.87–5.11)
RDW: 12.2 % (ref 11.5–15.5)
WBC: 16.1 10*3/uL — ABNORMAL HIGH (ref 4.0–10.5)
nRBC: 0 % (ref 0.0–0.2)

## 2020-05-28 LAB — HEMOGLOBIN AND HEMATOCRIT, BLOOD
HCT: 31.5 % — ABNORMAL LOW (ref 36.0–46.0)
Hemoglobin: 10.7 g/dL — ABNORMAL LOW (ref 12.0–15.0)

## 2020-05-28 LAB — COMPREHENSIVE METABOLIC PANEL
ALT: 42 U/L (ref 0–44)
AST: 44 U/L — ABNORMAL HIGH (ref 15–41)
Albumin: 3.1 g/dL — ABNORMAL LOW (ref 3.5–5.0)
Alkaline Phosphatase: 48 U/L (ref 38–126)
Anion gap: 10 (ref 5–15)
BUN: 10 mg/dL (ref 8–23)
CO2: 24 mmol/L (ref 22–32)
Calcium: 9.2 mg/dL (ref 8.9–10.3)
Chloride: 103 mmol/L (ref 98–111)
Creatinine, Ser: 0.66 mg/dL (ref 0.44–1.00)
GFR, Estimated: >60 mL/min (ref >60)
Glucose, Bld: 126 mg/dL — ABNORMAL HIGH (ref 70–99)
Potassium: 3.6 mmol/L (ref 3.5–5.1)
Sodium: 137 mmol/L (ref 135–145)
Total Bilirubin: 0.8 mg/dL (ref 0.3–1.2)
Total Protein: 6.1 g/dL — ABNORMAL LOW (ref 6.5–8.1)

## 2020-05-28 LAB — CBG MONITORING, ED
Glucose-Capillary: 115 mg/dL — ABNORMAL HIGH (ref 70–99)
Glucose-Capillary: 114 mg/dL — ABNORMAL HIGH (ref 70–99)

## 2020-05-28 SURGERY — LAPAROSCOPIC CHOLECYSTECTOMY WITH INTRAOPERATIVE CHOLANGIOGRAM
Anesthesia: General | Site: Abdomen

## 2020-05-28 MED ORDER — ESMOLOL HCL 100 MG/10ML IV SOLN
INTRAVENOUS | Status: DC | PRN
  Administered 2020-05-28 (×2): 10 mg via INTRAVENOUS

## 2020-05-28 MED ORDER — ACETAMINOPHEN 500 MG PO TABS: 1000.0000 mg | ORAL_TABLET | Freq: Four times a day (QID) | ORAL | Status: DC

## 2020-05-28 MED ORDER — PROPOFOL 10 MG/ML IV BOLUS
INTRAVENOUS | Status: DC | PRN
  Administered 2020-05-28: 100 mg via INTRAVENOUS

## 2020-05-28 MED ORDER — ENOXAPARIN SODIUM 40 MG/0.4ML ~~LOC~~ SOLN
40.0000 mg | SUBCUTANEOUS | Status: DC
  Administered 2020-05-29 – 2020-05-31 (×3): 40 mg via SUBCUTANEOUS
  Filled 2020-05-28 (×3): qty 0.4

## 2020-05-28 MED ORDER — PROPOFOL 10 MG/ML IV BOLUS
INTRAVENOUS | Status: AC
  Filled 2020-05-28: qty 20

## 2020-05-28 MED ORDER — LACTATED RINGERS IV SOLN: INTRAVENOUS | Status: AC

## 2020-05-28 MED ORDER — PHENYLEPHRINE 40 MCG/ML (10ML) SYRINGE FOR IV PUSH (FOR BLOOD PRESSURE SUPPORT)
PREFILLED_SYRINGE | INTRAVENOUS | Status: AC
  Filled 2020-05-28: qty 10

## 2020-05-28 MED ORDER — ONDANSETRON HCL 4 MG/2ML IJ SOLN
INTRAMUSCULAR | Status: AC
  Filled 2020-05-28: qty 2

## 2020-05-28 MED ORDER — ONDANSETRON HCL 4 MG/2ML IJ SOLN
INTRAMUSCULAR | Status: DC | PRN
  Administered 2020-05-28: 4 mg via INTRAVENOUS

## 2020-05-28 MED ORDER — MORPHINE SULFATE (PF) 2 MG/ML IV SOLN
1.0000 mg | INTRAVENOUS | Status: DC | PRN
  Administered 2020-05-29: 1 mg via INTRAVENOUS
  Filled 2020-05-28: qty 1

## 2020-05-28 MED ORDER — LACTATED RINGERS IV SOLN: INTRAVENOUS | Status: DC

## 2020-05-28 MED ORDER — ENOXAPARIN SODIUM 40 MG/0.4ML ~~LOC~~ SOLN: 40.0000 mg | Freq: Every day | SUBCUTANEOUS | Status: DC

## 2020-05-28 MED ORDER — CHLORHEXIDINE GLUCONATE 0.12 % MT SOLN
OROMUCOSAL | Status: AC
  Administered 2020-05-28: 15 mL via OROMUCOSAL
  Filled 2020-05-28: qty 15

## 2020-05-28 MED ORDER — FENTANYL CITRATE (PF) 100 MCG/2ML IJ SOLN: 25.0000 ug | INTRAMUSCULAR | Status: DC | PRN

## 2020-05-28 MED ORDER — SUGAMMADEX SODIUM 200 MG/2ML IV SOLN
INTRAVENOUS | Status: DC | PRN
  Administered 2020-05-28: 130 mg via INTRAVENOUS

## 2020-05-28 MED ORDER — DEXAMETHASONE SODIUM PHOSPHATE 4 MG/ML IJ SOLN
INTRAMUSCULAR | Status: DC | PRN
  Administered 2020-05-28: 4 mg via INTRAVENOUS

## 2020-05-28 MED ORDER — ROCURONIUM BROMIDE 10 MG/ML (PF) SYRINGE
PREFILLED_SYRINGE | INTRAVENOUS | Status: AC
  Filled 2020-05-28: qty 10

## 2020-05-28 MED ORDER — PHENYLEPHRINE 40 MCG/ML (10ML) SYRINGE FOR IV PUSH (FOR BLOOD PRESSURE SUPPORT)
PREFILLED_SYRINGE | INTRAVENOUS | Status: DC | PRN
  Administered 2020-05-28 (×3): 40 ug via INTRAVENOUS

## 2020-05-28 MED ORDER — DEXAMETHASONE SODIUM PHOSPHATE 10 MG/ML IJ SOLN
INTRAMUSCULAR | Status: AC
  Filled 2020-05-28: qty 1

## 2020-05-28 MED ORDER — FENTANYL CITRATE (PF) 100 MCG/2ML IJ SOLN
INTRAMUSCULAR | Status: DC | PRN
  Administered 2020-05-28 (×5): 50 ug via INTRAVENOUS

## 2020-05-28 MED ORDER — ROCURONIUM BROMIDE 100 MG/10ML IV SOLN
INTRAVENOUS | Status: DC | PRN
  Administered 2020-05-28: 50 mg via INTRAVENOUS

## 2020-05-28 MED ORDER — ONDANSETRON HCL 4 MG/2ML IJ SOLN: 4.0000 mg | Freq: Four times a day (QID) | INTRAMUSCULAR | Status: DC | PRN

## 2020-05-28 MED ORDER — OXYCODONE HCL 5 MG PO TABS
5.0000 mg | ORAL_TABLET | Freq: Four times a day (QID) | ORAL | Status: DC | PRN
  Administered 2020-05-29 – 2020-05-30 (×2): 5 mg via ORAL
  Filled 2020-05-28 (×3): qty 1

## 2020-05-28 MED ORDER — HEMOSTATIC AGENTS (NO CHARGE) OPTIME
TOPICAL | Status: DC | PRN
  Administered 2020-05-28: 1 via TOPICAL

## 2020-05-28 MED ORDER — LIDOCAINE HCL (CARDIAC) PF 100 MG/5ML IV SOSY
PREFILLED_SYRINGE | INTRAVENOUS | Status: DC | PRN
  Administered 2020-05-28: 60 mg via INTRAVENOUS

## 2020-05-28 MED ORDER — ORAL CARE MOUTH RINSE: 15.0000 mL | Freq: Once | OROMUCOSAL | Status: AC

## 2020-05-28 MED ORDER — OXYCODONE HCL 5 MG PO TABS: 5.0000 mg | ORAL_TABLET | Freq: Once | ORAL | Status: DC | PRN

## 2020-05-28 MED ORDER — ACETAMINOPHEN 500 MG PO TABS
1000.0000 mg | ORAL_TABLET | Freq: Four times a day (QID) | ORAL | Status: DC
  Administered 2020-05-28 – 2020-05-31 (×13): 1000 mg via ORAL
  Filled 2020-05-28 (×14): qty 2

## 2020-05-28 MED ORDER — PIPERACILLIN-TAZOBACTAM 3.375 G IVPB
3.3750 g | Freq: Three times a day (TID) | INTRAVENOUS | Status: DC
  Administered 2020-05-28 – 2020-05-29 (×3): 3.375 g via INTRAVENOUS
  Filled 2020-05-28 (×3): qty 50

## 2020-05-28 MED ORDER — SUCCINYLCHOLINE CHLORIDE 200 MG/10ML IV SOSY
PREFILLED_SYRINGE | INTRAVENOUS | Status: AC
  Filled 2020-05-28: qty 10

## 2020-05-28 MED ORDER — CHLORHEXIDINE GLUCONATE 0.12 % MT SOLN: 15.0000 mL | Freq: Once | OROMUCOSAL | Status: AC

## 2020-05-28 MED ORDER — 0.9 % SODIUM CHLORIDE (POUR BTL) OPTIME
TOPICAL | Status: DC | PRN
  Administered 2020-05-28: 1000 mL

## 2020-05-28 MED ORDER — BUPIVACAINE-EPINEPHRINE (PF) 0.25% -1:200000 IJ SOLN
INTRAMUSCULAR | Status: AC
  Filled 2020-05-28: qty 30

## 2020-05-28 MED ORDER — SIMETHICONE 80 MG PO CHEW: 40.0000 mg | CHEWABLE_TABLET | Freq: Four times a day (QID) | ORAL | Status: DC | PRN

## 2020-05-28 MED ORDER — SODIUM CHLORIDE 0.9 % IR SOLN
Status: DC | PRN
  Administered 2020-05-28: 1000 mL

## 2020-05-28 MED ORDER — BUPIVACAINE-EPINEPHRINE 0.25% -1:200000 IJ SOLN
INTRAMUSCULAR | Status: DC | PRN
  Administered 2020-05-28: 9 mL
  Administered 2020-05-28: 16 mL

## 2020-05-28 MED ORDER — FENTANYL CITRATE (PF) 250 MCG/5ML IJ SOLN
INTRAMUSCULAR | Status: AC
  Filled 2020-05-28: qty 5

## 2020-05-28 MED ORDER — OXYCODONE HCL 5 MG/5ML PO SOLN: 5.0000 mg | Freq: Once | ORAL | Status: DC | PRN

## 2020-05-28 MED ORDER — PHENYLEPHRINE HCL-NACL 10-0.9 MG/250ML-% IV SOLN
INTRAVENOUS | Status: DC | PRN
  Administered 2020-05-28: 20 ug/min via INTRAVENOUS

## 2020-05-28 MED ORDER — LIDOCAINE HCL (PF) 2 % IJ SOLN
INTRAMUSCULAR | Status: AC
  Filled 2020-05-28: qty 5

## 2020-05-28 SURGICAL SUPPLY — 50 items
ADH SKN CLS LQ APL DERMABOND (GAUZE/BANDAGES/DRESSINGS) ×1
APPLIER CLIP 5 13 M/L LIGAMAX5 (MISCELLANEOUS) ×2
CANISTER SUCT 3000ML PPV (MISCELLANEOUS) ×2 IMPLANT
CHLORAPREP W/TINT 26 (MISCELLANEOUS) ×2 IMPLANT
CLIP APPLIE 5 13 M/L LIGAMAX5 (MISCELLANEOUS) ×1 IMPLANT
COVER MAYO STAND STRL (DRAPES) ×2 IMPLANT
COVER SURGICAL LIGHT HANDLE (MISCELLANEOUS) ×2 IMPLANT
DERMABOND ADHESIVE PROPEN (GAUZE/BANDAGES/DRESSINGS) ×1
DERMABOND ADVANCED .7 DNX6 (GAUZE/BANDAGES/DRESSINGS) ×1 IMPLANT
DRSG TEGADERM 2-3/8X2-3/4 SM (GAUZE/BANDAGES/DRESSINGS) ×2 IMPLANT
DRSG TEGADERM 4X4.5 CHG (GAUZE/BANDAGES/DRESSINGS) ×2 IMPLANT
ELECT REM PT RETURN 9FT ADLT (ELECTROSURGICAL) ×2
ELECTRODE REM PT RTRN 9FT ADLT (ELECTROSURGICAL) ×1 IMPLANT
ENDOLOOP SUT PDS II  0 18 (SUTURE) ×4
ENDOLOOP SUT PDS II 0 18 (SUTURE) ×2 IMPLANT
GAUZE SPONGE 2X2 8PLY STRL LF (GAUZE/BANDAGES/DRESSINGS) ×1 IMPLANT
GLOVE BIOGEL M STRL SZ7.5 (GLOVE) ×6 IMPLANT
GLOVE BIOGEL PI IND STRL 6.5 (GLOVE) ×2 IMPLANT
GLOVE BIOGEL PI IND STRL 7.0 (GLOVE) ×2 IMPLANT
GLOVE BIOGEL PI INDICATOR 6.5 (GLOVE) ×2
GLOVE BIOGEL PI INDICATOR 7.0 (GLOVE) ×2
GLOVE INDICATOR 8.0 STRL GRN (GLOVE) ×4 IMPLANT
GOWN STRL REUS W/ TWL LRG LVL3 (GOWN DISPOSABLE) ×4 IMPLANT
GOWN STRL REUS W/TWL 2XL LVL3 (GOWN DISPOSABLE) ×2 IMPLANT
GOWN STRL REUS W/TWL LRG LVL3 (GOWN DISPOSABLE) ×8
GRASPER SUT TROCAR 14GX15 (MISCELLANEOUS) ×2 IMPLANT
HEMOSTAT SNOW SURGICEL 2X4 (HEMOSTASIS) ×2 IMPLANT
KIT BASIN OR (CUSTOM PROCEDURE TRAY) ×2 IMPLANT
KIT TURNOVER KIT B (KITS) ×2 IMPLANT
NS IRRIG 1000ML POUR BTL (IV SOLUTION) ×2 IMPLANT
PAD ARMBOARD 7.5X6 YLW CONV (MISCELLANEOUS) ×2 IMPLANT
POUCH RETRIEVAL ECOSAC 10 (ENDOMECHANICALS) ×1 IMPLANT
POUCH RETRIEVAL ECOSAC 10MM (ENDOMECHANICALS) ×2
SCISSORS LAP 5X35 DISP (ENDOMECHANICALS) ×2 IMPLANT
SET CHOLANGIOGRAPH 5 50 .035 (SET/KITS/TRAYS/PACK) ×2 IMPLANT
SET IRRIG TUBING LAPAROSCOPIC (IRRIGATION / IRRIGATOR) ×2 IMPLANT
SET TUBE SMOKE EVAC HIGH FLOW (TUBING) ×2 IMPLANT
SLEEVE ENDOPATH XCEL 5M (ENDOMECHANICALS) ×4 IMPLANT
SPONGE GAUZE 2X2 8PLY STRL LF (GAUZE/BANDAGES/DRESSINGS) ×2 IMPLANT
SPONGE GAUZE 2X2 STER 10/PKG (GAUZE/BANDAGES/DRESSINGS) ×1
STRIP CLOSURE SKIN 1/2X4 (GAUZE/BANDAGES/DRESSINGS) ×2 IMPLANT
SUT MNCRL AB 4-0 PS2 18 (SUTURE) ×2 IMPLANT
SUT VIC AB 0 UR5 27 (SUTURE) ×2 IMPLANT
SUT VICRYL 0 UR6 27IN ABS (SUTURE) IMPLANT
TOWEL GREEN STERILE (TOWEL DISPOSABLE) ×2 IMPLANT
TOWEL GREEN STERILE FF (TOWEL DISPOSABLE) ×2 IMPLANT
TRAY LAPAROSCOPIC MC (CUSTOM PROCEDURE TRAY) ×2 IMPLANT
TROCAR XCEL BLUNT TIP 100MML (ENDOMECHANICALS) ×2 IMPLANT
TROCAR XCEL NON-BLD 5MMX100MML (ENDOMECHANICALS) ×2 IMPLANT
WATER STERILE IRR 1000ML POUR (IV SOLUTION) ×2 IMPLANT

## 2020-05-28 NOTE — Transfer of Care (Signed)
Immediate Anesthesia Transfer of Care Note  Patient: Phyllis [name]  Procedure(s) Performed: LAPAROSCOPIC CHOLECYSTECTOMY WITH  POSSIBLE INTRAOPERATIVE CHOLANGIOGRAM (N/A )  Patient Location: PACU  Anesthesia Type:General  Level of Consciousness: drowsy  Airway & Oxygen Therapy: Patient Spontanous Breathing  Post-op Assessment: Report given to RN and Post -op Vital signs reviewed and stable  Post vital signs: Reviewed and stable  Last Vitals:  Vitals Value Taken Time  BP 146/98 05/28/20 1453  Temp    Pulse 110 05/28/20 1457  Resp 18 05/28/20 1457  SpO2 99 % 05/28/20 1457  Vitals shown include unvalidated device data.  Last Pain:  Vitals:   05/28/20 1220  TempSrc:   PainSc: 0-No pain      Patients Stated Pain Goal: 3 (05/28/20 1220)  Complications: No complications documented.

## 2020-05-28 NOTE — Op Note (Signed)
Phyllis [name] [id number] 1941-06-13 05/28/2020  Laparoscopic Cholecystectomy  Procedure Note  Indications: This patient presents with symptomatic gallbladder disease and will undergo laparoscopic cholecystectomy.  Pre-operative Diagnosis: acute calculous cholecystitis   Post-operative Diagnosis: gangrenous calculous cholecystitis  Surgeon: Gaynelle Adu MD FACS  Assistants: none  Anesthesia: General endotracheal anesthesia  Procedure Details  The patient was seen again in the Holding Room. The risks, benefits, complications, treatment options, and expected outcomes were discussed with the patient. The possibilities of reaction to medication, pulmonary aspiration, perforation of viscus, bleeding, recurrent infection, finding a normal gallbladder, the need for additional procedures, failure to diagnose a condition, the possible need to convert to an open procedure, and creating a complication requiring transfusion or operation were discussed with the patient. The likelihood of improving the patient's symptoms with return to their baseline status is good.  The patient and/or family concurred with the proposed plan, giving informed consent. The site of surgery properly noted. The patient was taken to Operating Room, identified as Phyllis [name] and the procedure verified as Laparoscopic Cholecystectomy with possible Intraoperative Cholangiogram. A Time Out was held and the above information confirmed. Antibiotic prophylaxis was administered.   Prior to the induction of general anesthesia, antibiotic prophylaxis was administered. General endotracheal anesthesia was then administered and tolerated well. After the induction, the abdomen was prepped with Chloraprep and draped in the sterile fashion. The patient was positioned in the supine position.  Local anesthetic agent was injected into the skin near the umbilicus and an transverse infraumbilical incision made. We dissected down to the abdominal  fascia with blunt dissection.  Patient had a pre-existing small fat-containing umbilical fascial defect.  The umbilicus was detached from the underlying herniated fat.  I used the pre-existing fascial defect.  It was about 1-1/2 cm.  We entered the peritoneal cavity bluntly.  A pursestring suture of 0-Vicryl was placed around the fascial opening.  The Hasson cannula was inserted and secured with the stay suture.  Pneumoperitoneum was then created with CO2 and tolerated well without any adverse changes in the patient's vital signs. An 5-mm port was placed in the subxiphoid position.  Two 5-mm ports were placed in the right upper quadrant. All skin incisions were infiltrated with a local anesthetic agent before making the incision and placing the trocars.   We positioned the patient in reverse Trendelenburg, tilted slightly to the patient's left.  The gallbladder was identified.  There was omentum encasing it.  I was able to peel the omentum off of it.  This revealed a distended severely inflamed gangrenous gallbladder.  There were patches of necrosis in the gallbladder wall.  In order to facilitate retraction the gallbladder was aspirated. the fundus grasped and retracted cephalad.  There was dense inflammation around the body and around the infundibulum.  I was able to peel this away. adhesions were lysed bluntly and with the electrocautery where indicated, taking care not to injure any adjacent organs or viscus. The infundibulum was grasped and retracted laterally, exposing the peritoneum overlying the triangle of Calot. This was then divided and exposed in a blunt fashion. A critical view of the cystic duct and cystic artery was obtained.  The cystic duct was clearly identified and bluntly dissected circumferentially.  I chose not to perform a cholangiogram due to the severe inflammation of the gallbladder.  I mobilized some of the peritoneum of the gallbladder both medially and laterally.  Mobilize some of the  posterior wall the gallbladder off of the liver  with hook electrocautery.  This allowed me to get a very large critical view of the cystic duct entering the gallbladder as well as the cystic artery entering the gallbladder.  There were no other structures entering the gallbladder.  The cystic duct was dilated I was able to put a clip across it but it was not completely across so therefore I transected the cystic duct as it entered the gallbladder at the infundibulum.  This left a large landing area for me to place a PDS Endoloop in order to ensure complete occlusion of the cystic duct remnant.  I was able to place a PDS Endoloop around this.   The cystic artery which had been identified & dissected free was ligated with clips and divided as well.   The gallbladder was dissected from the liver bed in retrograde fashion with the electrocautery. The gallbladder was removed and placed in an Ecco sac.  The gallbladder and Ecco sac were then removed through the umbilical port site. The liver bed was irrigated and inspected. Hemostasis was ultimately achieved with the electrocautery. Copious irrigation was utilized and was repeatedly aspirated until clear.  I did place a piece of surgical snow in the gallbladder fossa.  The patient had somewhat of a coarse liver consistent with perhaps hepatic steatosis.  The pursestring suture was used to close the umbilical fascia.  An additional interrupted 0 Vicryl was placed at the umbilical fascia using a PMI suture passer.  We again inspected the right upper quadrant for hemostasis.  The umbilical closure was inspected and there was no air leak and nothing trapped within the closure. Pneumoperitoneum was released as we removed the trocars.  4-0 Monocryl was used to close the skin.   steri-strips, and clean dressings were applied. The patient was then extubated and brought to the recovery room in stable condition. Instrument, sponge, and needle counts were correct at closure  and at the conclusion of the case.   Findings: Gangrenous necrotic cholecystitis with Cholelithiasis Large critical view PDS Endoloop around cystic duct stump Surgical snow  Estimated Blood Loss: Minimal         Drains: None         Specimens: Gallbladder           Complications: None; patient tolerated the procedure well.         Disposition: PACU - hemodynamically stable.         Condition: stable  Mary Sella. Andrey Campanile, MD, FACS General, Bariatric, & Minimally Invasive Surgery [facility], [place]

## 2020-05-28 NOTE — Anesthesia Procedure Notes (Signed)
Procedure Name: Intubation Date/Time: 05/28/2020 12:57 PM Performed by: Sammie Bench, CRNA Pre-anesthesia Checklist: Patient identified, Emergency Drugs available, Suction available and Patient being monitored Patient Re-evaluated:Patient Re-evaluated prior to induction Oxygen Delivery Method: Circle System Utilized Preoxygenation: Pre-oxygenation with 100% oxygen Induction Type: IV induction Ventilation: Mask ventilation without difficulty Laryngoscope Size: Mac and 3 Grade View: Grade I Tube type: Oral Tube size: 7.0 mm Number of attempts: 1 Airway Equipment and Method: Stylet and Oral airway Placement Confirmation: ETT inserted through vocal cords under direct vision,  positive ETCO2 and breath sounds checked- equal and bilateral Secured at: 21 cm Tube secured with: Tape Dental Injury: Teeth and Oropharynx as per pre-operative assessment

## 2020-05-28 NOTE — Plan of Care (Signed)
Initiation of Care Plan Problem: Education: Goal: Knowledge of General Education information will improve Description: Including pain rating scale, medication(s)/side effects and non-pharmacologic comfort measures 05/28/2020 1806 by Luna Kitchens, RN Outcome: Progressing 05/28/2020 1800 by Luna Kitchens, RN Outcome: Progressing   Problem: Health Behavior/Discharge Planning: Goal: Ability to manage health-related needs will improve 05/28/2020 1806 by Luna Kitchens, RN Outcome: Progressing 05/28/2020 1800 by Luna Kitchens, RN Outcome: Progressing   Problem: Clinical Measurements: Goal: Ability to maintain clinical measurements within normal limits will improve 05/28/2020 1806 by Luna Kitchens, RN Outcome: Progressing 05/28/2020 1800 by Luna Kitchens, RN Outcome: Progressing Goal: Will remain free from infection 05/28/2020 1806 by Luna Kitchens, RN Outcome: Progressing 05/28/2020 1800 by Luna Kitchens, RN Outcome: Progressing Goal: Diagnostic test results will improve 05/28/2020 1806 by Luna Kitchens, RN Outcome: Progressing 05/28/2020 1800 by Luna Kitchens, RN Outcome: Progressing Goal: Respiratory complications will improve 05/28/2020 1806 by Luna Kitchens, RN Outcome: Progressing 05/28/2020 1800 by Luna Kitchens, RN Outcome: Progressing Goal: Cardiovascular complication will be avoided 05/28/2020 1806 by Luna Kitchens, RN Outcome: Progressing 05/28/2020 1800 by Luna Kitchens, RN Outcome: Progressing   Problem: Activity: Goal: Risk for activity intolerance will decrease 05/28/2020 1806 by Luna Kitchens, RN Outcome: Progressing 05/28/2020 1800 by Luna Kitchens, RN Outcome: Progressing   Problem: Nutrition: Goal: Adequate nutrition will be maintained 05/28/2020 1806 by Luna Kitchens, RN Outcome: Progressing 05/28/2020 1800 by Luna Kitchens, RN Outcome: Progressing   Problem: Coping: Goal: Level of anxiety will decrease 05/28/2020 1806 by Luna Kitchens, RN Outcome:  Progressing 05/28/2020 1800 by Luna Kitchens, RN Outcome: Progressing   Problem: Elimination: Goal: Will not experience complications related to bowel motility 05/28/2020 1806 by Luna Kitchens, RN Outcome: Progressing 05/28/2020 1800 by Luna Kitchens, RN Outcome: Progressing Goal: Will not experience complications related to urinary retention 05/28/2020 1806 by Luna Kitchens, RN Outcome: Progressing 05/28/2020 1800 by Luna Kitchens, RN Outcome: Progressing   Problem: Coping: Goal: Level of anxiety will decrease 05/28/2020 1806 by Luna Kitchens, RN Outcome: Progressing 05/28/2020 1800 by Luna Kitchens, RN Outcome: Progressing   Problem: Pain Managment: Goal: General experience of comfort will improve 05/28/2020 1806 by Luna Kitchens, RN Outcome: Progressing 05/28/2020 1800 by Luna Kitchens, RN Outcome: Progressing   Problem: Safety: Goal: Ability to remain free from injury will improve 05/28/2020 1806 by Luna Kitchens, RN Outcome: Progressing 05/28/2020 1800 by Luna Kitchens, RN Outcome: Progressing   Problem: Skin Integrity: Goal: Risk for impaired skin integrity will decrease 05/28/2020 1806 by Luna Kitchens, RN Outcome: Progressing 05/28/2020 1800 by Luna Kitchens, RN Outcome: Progressing   Problem: Education: Goal: Required Educational Video(s) Outcome: Progressing   Problem: Clinical Measurements: Goal: Postoperative complications will be avoided or minimized Outcome: Progressing   Problem: Skin Integrity: Goal: Demonstration of wound healing without infection will improve Outcome: Progressing

## 2020-05-28 NOTE — Progress Notes (Signed)
Attempted to contact son x2  - mailbox full on cell  No one in surgical waiting.   Mary Sella. Andrey Campanile, MD, FACS General, Bariatric, & Minimally Invasive Surgery [facility], [place]

## 2020-05-28 NOTE — Progress Notes (Signed)
PROGRESS NOTE    Phyllis [name]  CXK:[id number] DOB: [birth year] DOA: 05/27/2020 PCP: [name], MD   Brief Narrative:  The 79 years old female with PMH significant for prior appendicectomy, LA grade A esophagitis, gastric ulcer, chronic depression and anxiety, insomnia, prior CVA, essential hypertension, hyperlipidemia, GERD, osteoporosis presented in the emergency department with sudden onset of right-sided abdominal pain associated with nausea and vomiting and poor PO intake for 1 day. Patient also report mechanical fall 4 days ago causing right clavicular fracture.  CT abdomen and pelvis with contrast shows acute gallstone cholecystitis.  Patient was started on IV Fluids and IV Zosyn,  general surgery was consulted.  Patient is admitted for sepsis secondary to acute cholecystitis.  Patient is going to have laparoscopic cholecystectomy with intraoperative cholangiogram on 12/5.   Assessment & Plan:   Active Problems:   Acute cholecystitis  Sepsis secondary to acute gallstone cholecystitis: She presented with T-max 101.2, heart rate 109, respiration rate 27, WBC 17.8K.  Lipase level and LFTs were normal.  Lactic acid 1.3.  Procalcitonin 0.53 Continue Tylenol as needed for fever and mild to moderate pain Ordered HIDA scan, which was canceled by general surgery. hold off narcotics for now in anticipation for HIDA scan. Follow blood cultures. Continue IV Zosyn empirically Continue lactated Ringer at 150 cc/h Hold off home oral antihypertensives and iron supplements in the setting of sepsis. Maintain MAP greater than 65 General surgery has been consulted, recommended lap chole. N.p.o. for now. Monitor fever curve and WBC Patient is scheduled to have laparoscopic cholecystectomy with intraoperative cholangiogram 12/5.  Tachycardia in the setting of sepsis:  EKG shows sinus tachycardia. Continue IV fluid for hydration Monitor on telemetry  Hyperglycemia, no known history of  diabetes Presented with serum glucose 170 Obtain hemoglobin A1c Start insulin sliding scale Monitor CBGs and avoid hypoglycemia  History of LA grade A esophagitis/gastric ulcer Resume home p.o. Protonix 40 mg twice daily. Hemoglobin is currently stable at 13.2K No overt bleeding at this time. Monitor H&H  Essential hypertension BP stable. Resume home medications once stable Monitor blood pressure, maintain MAP greater than 65  History of CVA She is not on antiplatelets. Resume home Pravachol  Chronic anxiety/depression Resume home regimen.  Hyperlipidemia LFTs unremarkable. Repeat CMP in the morning Resume home pravastatin  Chronic insomnia Resume home regimen               DVT prophylaxis: Lovenox Code Status: Full code Family Communication: No family in the room Disposition Plan:   Status is: Inpatient  Remains inpatient appropriate because:Inpatient level of care appropriate due to severity of illness   Dispo:  Patient From: Home  Planned Disposition: Home  Expected discharge date: 1-2 days.  Medically stable for discharge: No  Consultants:   General surgery  Procedures: Laparoscopic cholecystectomy ,  intraoperative cholangiogram.  Antimicrobials:  Anti-infectives (From admission, onward)   Start     Dose/Rate Route Frequency Ordered Stop   05/28/20 1300  piperacillin-tazobactam (ZOSYN) IVPB 3.375 g        3.375 g 12.5 mL/hr over 240 Minutes Intravenous Every 8 hours 05/28/20 0831     05/28/20 0400  piperacillin-tazobactam (ZOSYN) IVPB 3.375 g  Status:  Discontinued        3.375 g 12.5 mL/hr over 240 Minutes Intravenous Every 8 hours 05/27/20 2109 05/28/20 0831   05/27/20 2200  piperacillin-tazobactam (ZOSYN) IVPB 3.375 g  Status:  Discontinued        3.375 g 12.5 mL/hr  over 240 Minutes Intravenous Every 12 hours 05/27/20 1850 05/27/20 1854   05/27/20 2200  piperacillin-tazobactam (ZOSYN) IVPB 3.375 g  Status:  Discontinued        3.375  g 100 mL/hr over 30 Minutes Intravenous Every 8 hours 05/27/20 2043 05/27/20 2109   05/27/20 1900  cefTRIAXone (ROCEPHIN) 2 g in sodium chloride 0.9 % 100 mL IVPB  Status:  Discontinued        2 g 200 mL/hr over 30 Minutes Intravenous  Once 05/27/20 1850 05/27/20 1853   05/27/20 1900  piperacillin-tazobactam (ZOSYN) IVPB 3.375 g        3.375 g 100 mL/hr over 30 Minutes Intravenous  Once 05/27/20 1854 05/27/20 2139      Subjective: Patient was seen and examined at bedside.  Overnight events noted.  Patient still has right upper quadrant abdominal tenderness.  She denies any nausea and vomiting.  Objective: Vitals:   05/28/20 0800 05/28/20 0900 05/28/20 0940 05/28/20 1013  BP: 130/63 136/75 (!) 141/68   Pulse: (!) 102 92 96   Resp: 18 16 18    Temp:   98.6 F (37 C) (!) 100.7 F (38.2 C)  TempSrc:   Oral Oral  SpO2: 94% 99% 100% 98%    Intake/Output Summary (Last 24 hours) at 05/28/2020 1053 Last data filed at 05/27/2020 2138 Gross per 24 hour  Intake 2000 ml  Output --  Net 2000 ml   There were no vitals filed for this visit.  Examination:  General exam: Appears calm and comfortable , not in any acute distress. Respiratory system: Clear to auscultation. Respiratory effort normal. Cardiovascular system: S1 & S2 heard, RRR. No JVD, murmurs, rubs, gallops or clicks. No pedal edema. Gastrointestinal system: Abdomen is nondistended, soft, RUQ tenderness +. No organomegaly or masses felt.  Normal bowel sounds heard. Central nervous system: Alert and oriented. No focal neurological deficits. Extremities: No edema, no cyanosis, no clubbing. Skin: No rashes, lesions or ulcers Psychiatry: Judgement and insight appear normal. Mood & affect appropriate.     Data Reviewed: I have personally reviewed following labs and imaging studies  CBC: Recent Labs  Lab 05/27/20 1102 05/28/20 0009 05/28/20 0500  WBC 17.8*  --  16.1*  NEUTROABS  --   --  14.6*  HGB 13.2 10.7* 11.8*  HCT  39.6 31.5* 35.7*  MCV 99.2  --  98.3  PLT 255  --  223   Basic Metabolic Panel: Recent Labs  Lab 05/27/20 1102 05/28/20 0500  NA 135 137  K 3.8 3.6  CL 100 103  CO2 21* 24  GLUCOSE 170* 126*  BUN 10 10  CREATININE 0.58 0.66  CALCIUM 9.6 9.2   GFR: CrCl cannot be calculated (Unknown ideal weight.). Liver Function Tests: Recent Labs  Lab 05/27/20 1102 05/28/20 0500  AST 28 44*  ALT 22 42  ALKPHOS 54 48  BILITOT 0.7 0.8  PROT 7.4 6.1*  ALBUMIN 4.0 3.1*   Recent Labs  Lab 05/27/20 1102  LIPASE 25   No results for input(s): AMMONIA in the last 168 hours. Coagulation Profile: No results for input(s): INR, PROTIME in the last 168 hours. Cardiac Enzymes: No results for input(s): CKTOTAL, CKMB, CKMBINDEX, TROPONINI in the last 168 hours. BNP (last 3 results) No results for input(s): PROBNP in the last 8760 hours. HbA1C: No results for input(s): HGBA1C in the last 72 hours. CBG: Recent Labs  Lab 05/27/20 2154 05/27/20 2355 05/28/20 0430 05/28/20 0807  GLUCAP 99 123* 115* 114*  Lipid Profile: No results for input(s): CHOL, HDL, LDLCALC, TRIG, CHOLHDL, LDLDIRECT in the last 72 hours. Thyroid Function Tests: No results for input(s): TSH, T4TOTAL, FREET4, T3FREE, THYROIDAB in the last 72 hours. Anemia Panel: No results for input(s): VITAMINB12, FOLATE, FERRITIN, TIBC, IRON, RETICCTPCT in the last 72 hours. Sepsis Labs: Recent Labs  Lab 05/27/20 2020 05/27/20 2205  PROCALCITON  --  0.53  LATICACIDVEN 1.3  --     Recent Results (from the past 240 hour(s))  Culture, blood (routine x 2)     Status: None (Preliminary result)   Collection Time: 05/27/20  8:00 PM   Specimen: BLOOD  Result Value Ref Range Status   Specimen Description BLOOD RIGHT ANTECUBITAL  Final   Special Requests   Final    BOTTLES DRAWN AEROBIC AND ANAEROBIC Blood Culture results may not be optimal due to an excessive volume of blood received in culture bottles   Culture   Final    NO  GROWTH < 12 HOURS Performed at [facility], [address] [place], [place] [address]    Report Status PENDING  Incomplete  Resp Panel by RT-PCR (Flu A&B, Covid) Nasopharyngeal Swab     Status: None   Collection Time: 05/27/20  8:04 PM   Specimen: Nasopharyngeal Swab; Nasopharyngeal(NP) swabs in vial transport medium  Result Value Ref Range Status   SARS Coronavirus 2 by RT PCR NEGATIVE NEGATIVE Final    Comment: (NOTE) SARS-CoV-2 target nucleic acids are NOT DETECTED.  The SARS-CoV-2 RNA is generally detectable in upper respiratory specimens during the acute phase of infection. The lowest concentration of SARS-CoV-2 viral copies this assay can detect is 138 copies/mL. A negative result does not preclude SARS-Cov-2 infection and should not be used as the sole basis for treatment or other patient management decisions. A negative result may occur with  improper specimen collection/handling, submission of specimen other than nasopharyngeal swab, presence of viral mutation(s) within the areas targeted by this assay, and inadequate number of viral copies(<138 copies/mL). A negative result must be combined with clinical observations, patient history, and epidemiological information. The expected result is Negative.  Fact Sheet for Patients:  BloggerCourse.comhttps://www.fda.gov/media/152166/download  Fact Sheet for Healthcare Providers:  SeriousBroker.ithttps://www.fda.gov/media/152162/download  This test is no t yet approved or cleared by the [place] FDA and  has been authorized for detection and/or diagnosis of SARS-CoV-2 by FDA under an Emergency Use Authorization (EUA). This EUA will remain  in effect (meaning this test can be used) for the duration of the COVID-19 declaration under Section 564(b)(1) of the Act, 21 U.S.C.section 360bbb-3(b)(1), unless the authorization is terminated  or revoked sooner.       Influenza A by PCR NEGATIVE NEGATIVE Final   Influenza B by PCR NEGATIVE NEGATIVE Final      Comment: (NOTE) The Xpert Xpress SARS-CoV-2/FLU/RSV plus assay is intended as an aid in the diagnosis of influenza from Nasopharyngeal swab specimens and should not be used as a sole basis for treatment. Nasal washings and aspirates are unacceptable for Xpert Xpress SARS-CoV-2/FLU/RSV testing.  Fact Sheet for Patients: BloggerCourse.comhttps://www.fda.gov/media/152166/download  Fact Sheet for Healthcare Providers: SeriousBroker.ithttps://www.fda.gov/media/152162/download  This test is not yet approved or cleared by the [place] FDA and has been authorized for detection and/or diagnosis of SARS-CoV-2 by FDA under an Emergency Use Authorization (EUA). This EUA will remain in effect (meaning this test can be used) for the duration of the COVID-19 declaration under Section 564(b)(1) of the Act, 21 U.S.C. section 360bbb-3(b)(1), unless the  authorization is terminated or revoked.  Performed at [facility], [address], [place], [place] [address]   Culture, blood (routine x 2)     Status: None (Preliminary result)   Collection Time: 05/27/20  8:59 PM   Specimen: BLOOD  Result Value Ref Range Status   Specimen Description BLOOD SITE NOT SPECIFIED  Final   Special Requests   Final    BOTTLES DRAWN AEROBIC AND ANAEROBIC Blood Culture results may not be optimal due to an inadequate volume of blood received in culture bottles   Culture   Final    NO GROWTH < 12 HOURS Performed at [facility], [address] [place], [place] [address]    Report Status PENDING  Incomplete    Radiology Studies: CT ABDOMEN PELVIS W CONTRAST  Result Date: 05/27/2020 CLINICAL DATA:  Abdominal pain, generalized but worse on the right. Nausea vomiting. EXAM: CT ABDOMEN AND PELVIS WITH CONTRAST TECHNIQUE: Multidetector CT imaging of the abdomen and pelvis was performed using the standard protocol following bolus administration of intravenous contrast. CONTRAST:  OMNIPAQUE IOHEXOL 300 MG/ML  SOLN COMPARISON:   04/30/2018 FINDINGS: Lower chest: No acute abnormality. Hepatobiliary: Liver normal in size. 5 mm low-attenuation lesion, segment 6. Tiny low-attenuation lesion in segment 7. These are both likely cysts. More definitive cyst in segment 4 B, 1.5 cm. No other liver masses or lesions. Gallbladder is distended. Small amount of fluid attenuation lies between the gallbladder and liver. Small stone in the fundus. Questionable stone in neck/cystic duct. Mild inflammation is seen in the right upper quadrant adjacent to the gallbladder. Trace amount of fluid is seen along the anterior margin of the inferior liver. Prominent common bile duct, 7 mm, with distal tapering, but normal for age. Pancreas: Unremarkable. No pancreatic ductal dilatation or surrounding inflammatory changes. Spleen: Normal in size without focal abnormality. Adrenals/Urinary Tract: No adrenal masses. Kidneys normal in size, orientation and position. 5 mm low-attenuation cortical lesion, midpole the left kidney, consistent with a cyst. No other masses, no stones and no hydronephrosis. Normal ureters. Normal bladder. Stomach/Bowel: Normal stomach and small bowel. Mild to moderate increased colonic stool burden. No colon wall thickening or inflammation. Vascular/Lymphatic: Aortic atherosclerosis. No aneurysm. No enlarged lymph nodes. Reproductive: Uterus unremarkable. No adnexal masses. Prominent periuterine veins. Other: Small fat containing umbilical hernia. Musculoskeletal: No fracture or acute finding.  No bone lesion. IMPRESSION: 1. Findings suggest acute cholecystitis. Gallbladder is distended with mild adjacent inflammation. There are gallstones and a small amount of pericholecystic fluid. Consider follow-up right upper quadrant ultrasound more definitive assessment. 2. Trace ascites. 3. No other acute abnormality within the abdomen or pelvis. 4. Aortic atherosclerosis. 5. Mild to moderate increased colonic stool burden. Electronically Signed   By:  Amie Portland M.D.   On: 05/27/2020 18:43   Scheduled Meds: . acetaminophen  1,000 mg Oral Q6H  . ascorbic acid  500 mg Oral Daily  . cholecalciferol  1,000 Units Oral Daily  . [START ON 05/29/2020] enoxaparin (LOVENOX) injection  40 mg Subcutaneous Daily  . insulin aspart  0-6 Units Subcutaneous Q4H  . melatonin  5 mg Oral QHS  . multivitamin with minerals  1 tablet Oral Daily  . pantoprazole  40 mg Oral BID  . pravastatin  40 mg Oral Daily  . venlafaxine XR  150 mg Oral Q breakfast  . vitamin B-12  1,000 mcg Oral Daily   Continuous Infusions: . sodium chloride Stopped (05/27/20 2048)  . lactated ringers  100 mL/hr at 05/28/20 0834  . piperacillin-tazobactam (ZOSYN)  IV       LOS: 1 day    Time spent: 35 mins.    Cipriano Bunker, MD [facility]   If 7PM-7AM, please contact night-coverage

## 2020-05-28 NOTE — Anesthesia Postprocedure Evaluation (Signed)
Anesthesia Post Note  Patient: Phyllis [name]  Procedure(s) Performed: LAPAROSCOPIC CHOLECYSTECTOMY (N/A Abdomen)     Patient location during evaluation: PACU Anesthesia Type: General Level of consciousness: awake Pain management: pain level controlled Vital Signs Assessment: post-procedure vital signs reviewed and stable Respiratory status: spontaneous breathing Cardiovascular status: stable Postop Assessment: no apparent nausea or vomiting Anesthetic complications: no   No complications documented.  Last Vitals:  Vitals:   05/28/20 1540 05/28/20 1555  BP: (!) 152/80 (!) 157/75  Pulse: (!) 111 (!) 109  Resp: 17 19  Temp:    SpO2: 93% 92%    Last Pain:  Vitals:   05/28/20 1525  TempSrc:   PainSc: Asleep                 Lauramae Kneisley

## 2020-05-28 NOTE — ED Notes (Signed)
Spoke with admitting Dr. Margo Aye concerning orders to transfuse 2 units of blood. Per admitting to hold orders for now.

## 2020-05-28 NOTE — Anesthesia Preprocedure Evaluation (Signed)
Anesthesia Evaluation  Patient identified by MRN, date of birth, ID band Patient awake    Reviewed: Allergy & Precautions, H&P , NPO status , Patient's Chart, lab work & pertinent test results  Airway Mallampati: II   Neck ROM: full    Dental   Pulmonary former smoker,    breath sounds clear to auscultation       Cardiovascular hypertension,  Rhythm:regular Rate:Normal     Neuro/Psych  Headaches, PSYCHIATRIC DISORDERS Depression    GI/Hepatic cholecystitis   Endo/Other    Renal/GU      Musculoskeletal   Abdominal   Peds  Hematology   Anesthesia Other Findings   Reproductive/Obstetrics                             Anesthesia Physical Anesthesia Plan  ASA: II  Anesthesia Plan: General   Post-op Pain Management:    Induction: Intravenous  PONV Risk Score and Plan: 3 and Ondansetron, Dexamethasone and Treatment may vary due to age or medical condition  Airway Management Planned: Oral ETT  Additional Equipment:   Intra-op Plan:   Post-operative Plan: Extubation in OR  Informed Consent: I have reviewed the patients History and Physical, chart, labs and discussed the procedure including the risks, benefits and alternatives for the proposed anesthesia with the patient or authorized representative who has indicated his/her understanding and acceptance.       Plan Discussed with: CRNA, Anesthesiologist and Surgeon  Anesthesia Plan Comments:         Anesthesia Quick Evaluation

## 2020-05-28 NOTE — ED Notes (Signed)
All personal belongings given to patient's son per patient's request.

## 2020-05-28 NOTE — Progress Notes (Signed)
Subjective: Patient still with pain on the right side of her abdomen this morning.  Started on Friday along with some N/V.  Had right shoulder pain after fall on Tuesday, but this is a different pain.  Had temp of 101.2 overnight  ROS: See above, otherwise other systems negative  Objective: Vital signs in last 24 hours: Temp:  [98.3 F (36.8 C)-101.2 F (38.4 C)] 99.4 F (37.4 C) (12/05 0432) Pulse Rate:  [87-117] 102 (12/05 0800) Resp:  [15-30] 18 (12/05 0800) BP: (122-181)/(57-125) 130/63 (12/05 0800) SpO2:  [94 %-100 %] 94 % (12/05 0800)    Intake/Output from previous day: 12/04 0701 - 12/05 0700 In: 2000 [IV Piggyback:2000] Out: -  Intake/Output this shift: No intake/output data recorded.  PE: Gen: sleepy, but NAD Heart: tachy, but regular Lungs: CTAB Abd: soft, reducible small umbo hernia, tender greatest on right-side of abdomen/RUQ, but some mild tenderness diffusely, hypoactive BS, ND  Lab Results:  Recent Labs    05/27/20 1102 05/27/20 1102 05/28/20 0009 05/28/20 0500  WBC 17.8*  --   --  16.1*  HGB 13.2   < > 10.7* 11.8*  HCT 39.6   < > 31.5* 35.7*  PLT 255  --   --  223   < > = values in this interval not displayed.   BMET Recent Labs    05/27/20 1102 05/28/20 0500  NA 135 137  K 3.8 3.6  CL 100 103  CO2 21* 24  GLUCOSE 170* 126*  BUN 10 10  CREATININE 0.58 0.66  CALCIUM 9.6 9.2   PT/INR No results for input(s): LABPROT, INR in the last 72 hours. CMP     Component Value Date/Time   NA 137 05/28/2020 0500   K 3.6 05/28/2020 0500   CL 103 05/28/2020 0500   CO2 24 05/28/2020 0500   GLUCOSE 126 (H) 05/28/2020 0500   BUN 10 05/28/2020 0500   CREATININE 0.66 05/28/2020 0500   CALCIUM 9.2 05/28/2020 0500   PROT 6.1 (L) 05/28/2020 0500   ALBUMIN 3.1 (L) 05/28/2020 0500   AST 44 (H) 05/28/2020 0500   ALT 42 05/28/2020 0500   ALKPHOS 48 05/28/2020 0500   BILITOT 0.8 05/28/2020 0500   GFRNONAA >60 05/28/2020 0500   GFRAA >60  03/10/2018 0405   Lipase     Component Value Date/Time   LIPASE 25 05/27/2020 1102       Studies/Results: CT ABDOMEN PELVIS W CONTRAST  Result Date: 05/27/2020 CLINICAL DATA:  Abdominal pain, generalized but worse on the right. Nausea vomiting. EXAM: CT ABDOMEN AND PELVIS WITH CONTRAST TECHNIQUE: Multidetector CT imaging of the abdomen and pelvis was performed using the standard protocol following bolus administration of intravenous contrast. CONTRAST:  OMNIPAQUE IOHEXOL 300 MG/ML  SOLN COMPARISON:  04/30/2018 FINDINGS: Lower chest: No acute abnormality. Hepatobiliary: Liver normal in size. 5 mm low-attenuation lesion, segment 6. Tiny low-attenuation lesion in segment 7. These are both likely cysts. More definitive cyst in segment 4 B, 1.5 cm. No other liver masses or lesions. Gallbladder is distended. Small amount of fluid attenuation lies between the gallbladder and liver. Small stone in the fundus. Questionable stone in neck/cystic duct. Mild inflammation is seen in the right upper quadrant adjacent to the gallbladder. Trace amount of fluid is seen along the anterior margin of the inferior liver. Prominent common bile duct, 7 mm, with distal tapering, but normal for age. Pancreas: Unremarkable. No pancreatic ductal dilatation or surrounding inflammatory changes. Spleen:  Normal in size without focal abnormality. Adrenals/Urinary Tract: No adrenal masses. Kidneys normal in size, orientation and position. 5 mm low-attenuation cortical lesion, midpole the left kidney, consistent with a cyst. No other masses, no stones and no hydronephrosis. Normal ureters. Normal bladder. Stomach/Bowel: Normal stomach and small bowel. Mild to moderate increased colonic stool burden. No colon wall thickening or inflammation. Vascular/Lymphatic: Aortic atherosclerosis. No aneurysm. No enlarged lymph nodes. Reproductive: Uterus unremarkable. No adnexal masses. Prominent periuterine veins. Other: Small fat containing  umbilical hernia. Musculoskeletal: No fracture or acute finding.  No bone lesion. IMPRESSION: 1. Findings suggest acute cholecystitis. Gallbladder is distended with mild adjacent inflammation. There are gallstones and a small amount of pericholecystic fluid. Consider follow-up right upper quadrant ultrasound more definitive assessment. 2. Trace ascites. 3. No other acute abnormality within the abdomen or pelvis. 4. Aortic atherosclerosis. 5. Mild to moderate increased colonic stool burden. Electronically Signed   By: Amie Portland M.D.   On: 05/27/2020 18:43    Anti-infectives: Anti-infectives (From admission, onward)   Start     Dose/Rate Route Frequency Ordered Stop   05/28/20 1200  piperacillin-tazobactam (ZOSYN) IVPB 3.375 g        3.375 g 12.5 mL/hr over 240 Minutes Intravenous Every 8 hours 05/28/20 0831     05/28/20 0400  piperacillin-tazobactam (ZOSYN) IVPB 3.375 g  Status:  Discontinued        3.375 g 12.5 mL/hr over 240 Minutes Intravenous Every 8 hours 05/27/20 2109 05/28/20 0831   05/27/20 2200  piperacillin-tazobactam (ZOSYN) IVPB 3.375 g  Status:  Discontinued        3.375 g 12.5 mL/hr over 240 Minutes Intravenous Every 12 hours 05/27/20 1850 05/27/20 1854   05/27/20 2200  piperacillin-tazobactam (ZOSYN) IVPB 3.375 g  Status:  Discontinued        3.375 g 100 mL/hr over 30 Minutes Intravenous Every 8 hours 05/27/20 2043 05/27/20 2109   05/27/20 1900  cefTRIAXone (ROCEPHIN) 2 g in sodium chloride 0.9 % 100 mL IVPB  Status:  Discontinued        2 g 200 mL/hr over 30 Minutes Intravenous  Once 05/27/20 1850 05/27/20 1853   05/27/20 1900  piperacillin-tazobactam (ZOSYN) IVPB 3.375 g        3.375 g 100 mL/hr over 30 Minutes Intravenous  Once 05/27/20 1854 05/27/20 2139       Assessment/Plan HTN HLD Depression Recent Fall with L shoulder pain  Acute calculous cholecystitis The patient clinically and radiographically has evidence of cholecystitis.  She has tachycardia and a  fever overnight.  Still with abdominal pain this morning.  Do not feels she needs HIDA scan at this time.  I have d/w Dr. Andrey Campanile and we will proceed to OR today for lap chole +/- IOC.  I have called her son, Amada Jupiter, and discussed this with him as well.   I have explained the procedure, risks, and aftercare of cholecystectomy.  Risks include but are not limited to bleeding, infection, wound problems, anesthesia, diarrhea, bile leak, injury to common bile duct/liver/intestine.  She and her son seem to understand and agree to proceed. She does live by herself at home, but has her sister and son who can help her as needed after discharge.  FEN - NPO/IVFs VTE - lovenox post op ID - [email or web address]   LOS: 1 day    Letha Cape , [facility] 05/28/2020, 8:32 AM Please see Amion for pager number during day hours 7:00am-4:30pm or 7:00am -11:30am on weekends

## 2020-05-29 ENCOUNTER — Encounter (HOSPITAL_COMMUNITY): Payer: Self-pay | Admitting: General Surgery

## 2020-05-29 LAB — COMPREHENSIVE METABOLIC PANEL
ALT: 51 U/L — ABNORMAL HIGH (ref 0–44)
AST: 49 U/L — ABNORMAL HIGH (ref 15–41)
Albumin: 2.5 g/dL — ABNORMAL LOW (ref 3.5–5.0)
Alkaline Phosphatase: 60 U/L (ref 38–126)
Anion gap: 11 (ref 5–15)
BUN: 13 mg/dL (ref 8–23)
CO2: 24 mmol/L (ref 22–32)
Calcium: 8.7 mg/dL — ABNORMAL LOW (ref 8.9–10.3)
Chloride: 102 mmol/L (ref 98–111)
Creatinine, Ser: 0.63 mg/dL (ref 0.44–1.00)
GFR, Estimated: >60 mL/min (ref >60)
Glucose, Bld: 114 mg/dL — ABNORMAL HIGH (ref 70–99)
Potassium: 3.1 mmol/L — ABNORMAL LOW (ref 3.5–5.1)
Sodium: 137 mmol/L (ref 135–145)
Total Bilirubin: 0.7 mg/dL (ref 0.3–1.2)
Total Protein: 5.8 g/dL — ABNORMAL LOW (ref 6.5–8.1)

## 2020-05-29 LAB — CBC WITH DIFFERENTIAL/PLATELET
Abs Immature Granulocytes: 0.03 10*3/uL (ref 0.00–0.07)
Basophils Absolute: 0 10*3/uL (ref 0.0–0.1)
Basophils Relative: 0 %
Eosinophils Absolute: 0 10*3/uL (ref 0.0–0.5)
Eosinophils Relative: 0 %
HCT: 32.1 % — ABNORMAL LOW (ref 36.0–46.0)
Hemoglobin: 11.3 g/dL — ABNORMAL LOW (ref 12.0–15.0)
Immature Granulocytes: 0 %
Lymphocytes Relative: 6 %
Lymphs Abs: 0.7 10*3/uL (ref 0.7–4.0)
MCH: 33.6 pg (ref 26.0–34.0)
MCHC: 35.2 g/dL (ref 30.0–36.0)
MCV: 95.5 fL (ref 80.0–100.0)
Monocytes Absolute: 0.3 10*3/uL (ref 0.1–1.0)
Monocytes Relative: 3 %
Neutro Abs: 9.7 10*3/uL — ABNORMAL HIGH (ref 1.7–7.7)
Neutrophils Relative %: 91 %
Platelets: 208 10*3/uL (ref 150–400)
RBC: 3.36 MIL/uL — ABNORMAL LOW (ref 3.87–5.11)
RDW: 12.2 % (ref 11.5–15.5)
WBC: 10.7 10*3/uL — ABNORMAL HIGH (ref 4.0–10.5)
nRBC: 0 % (ref 0.0–0.2)

## 2020-05-29 LAB — PHOSPHORUS: Phosphorus: 2.8 mg/dL (ref 2.5–4.6)

## 2020-05-29 LAB — HEMOGLOBIN A1C
Hgb A1c MFr Bld: 5.6 % (ref 4.8–5.6)
Mean Plasma Glucose: 114.02 mg/dL

## 2020-05-29 LAB — MAGNESIUM: Magnesium: 2 mg/dL (ref 1.7–2.4)

## 2020-05-29 MED ORDER — POTASSIUM CHLORIDE 20 MEQ PO PACK
40.0000 meq | PACK | Freq: Once | ORAL | Status: AC
  Administered 2020-05-29: 40 meq via ORAL
  Filled 2020-05-29: qty 2

## 2020-05-29 NOTE — Progress Notes (Signed)
PROGRESS NOTE    Phyllis [name]  ONG:[id number] DOB: 16-Mar-1941 DOA: 05/27/2020 PCP: [name], MD   Brief Narrative:  The 79 years old female with PMH significant for prior appendicectomy, LA grade A esophagitis, gastric ulcer, chronic depression and anxiety, insomnia, prior CVA, essential hypertension, hyperlipidemia, GERD, osteoporosis presented in the emergency department with sudden onset of right-sided abdominal pain associated with nausea and vomiting and poor PO intake for 1 day. Patient also report mechanical fall 4 days ago causing right clavicular fracture.  CT abdomen and pelvis with contrast shows acute gallstone cholecystitis.  Patient was started on IV Fluids and IV Zosyn,  general surgery was consulted.  Patient is admitted for sepsis secondary to acute cholecystitis.  Patient underwent laparoscopic cholecystectomy with intraoperative cholangiogram on 12/5.  Tolerated well.   Assessment & Plan:   Active Problems:   Acute cholecystitis  Sepsis secondary to acute gallstone cholecystitis: She presented with T-max 101.2, heart rate 109, respiration rate 27, WBC 17.8K.  Lipase level and LFTs were normal.  Lactic acid 1.3.  Procalcitonin 0.53 Continue Tylenol as needed for fever and mild to moderate pain Ordered HIDA scan, which was canceled by general surgery. hold off narcotics for now in anticipation for HIDA scan. Follow blood cultures. Continue IV Zosyn empirically Continue lactated Ringer at 150 cc/h Hold off home oral antihypertensives and iron supplements in the setting of sepsis. Maintain MAP greater than 65 General surgery has been consulted, recommended lap chole. Monitor fever curve and WBC Patient underwent laparoscopic cholecystectomy with intraoperative cholangiogram 12/5. She still has mild soreness but denies any nausea and vomiting.   Tolerated clear liquid diet.  Tachycardia in the setting of sepsis:  Resolved. EKG shows sinus tachycardia. Continue  IV fluid for hydration Monitor on telemetry  Hyperglycemia, no known history of diabetes. Presented with serum glucose 170 Hemoglobin A1c 5.6 Continue insulin sliding scale Monitor CBGs and avoid hypoglycemia  History of LA grade A esophagitis/gastric ulcer Resume home p.o. Protonix 40 mg twice daily. Hemoglobin is currently stable at 13.2K No overt bleeding at this time. Monitor H&H  Essential hypertension BP stable. Resume home medications once stable Monitor blood pressure, maintain MAP greater than 65  History of CVA She is not on antiplatelets. Resume home Pravachol  Chronic anxiety/depression Resume home regimen.  Hyperlipidemia LFTs unremarkable. Repeat CMP in the morning Resume home pravastatin  Chronic insomnia Resume home regimen             DVT prophylaxis: Lovenox Code Status: Full code Family Communication: No family in the room Disposition Plan:   Status is: Inpatient  Remains inpatient appropriate because:Inpatient level of care appropriate due to severity of illness   Dispo:  Patient From: Home  Planned Disposition: Home  Expected discharge date: 1-2 days.  Medically stable for discharge: No  Consultants:   General surgery  Procedures: Laparoscopic cholecystectomy ,  intraoperative cholangiogram.  Antimicrobials:  Anti-infectives (From admission, onward)   Start     Dose/Rate Route Frequency Ordered Stop   05/28/20 1300  piperacillin-tazobactam (ZOSYN) IVPB 3.375 g  Status:  Discontinued        3.375 g 12.5 mL/hr over 240 Minutes Intravenous Every 8 hours 05/28/20 0831 05/29/20 1116   05/28/20 0400  piperacillin-tazobactam (ZOSYN) IVPB 3.375 g  Status:  Discontinued        3.375 g 12.5 mL/hr over 240 Minutes Intravenous Every 8 hours 05/27/20 2109 05/28/20 0831   05/27/20 2200  piperacillin-tazobactam (ZOSYN) IVPB 3.375 g  Status:  Discontinued        3.375 g 12.5 mL/hr over 240 Minutes Intravenous Every 12 hours 05/27/20  1850 05/27/20 1854   05/27/20 2200  piperacillin-tazobactam (ZOSYN) IVPB 3.375 g  Status:  Discontinued        3.375 g 100 mL/hr over 30 Minutes Intravenous Every 8 hours 05/27/20 2043 05/27/20 2109   05/27/20 1900  cefTRIAXone (ROCEPHIN) 2 g in sodium chloride 0.9 % 100 mL IVPB  Status:  Discontinued        2 g 200 mL/hr over 30 Minutes Intravenous  Once 05/27/20 1850 05/27/20 1853   05/27/20 1900  piperacillin-tazobactam (ZOSYN) IVPB 3.375 g        3.375 g 100 mL/hr over 30 Minutes Intravenous  Once 05/27/20 1854 05/27/20 2139      Subjective: Patient was seen and examined at bedside.  Overnight events noted.   Patient is status post laparoscopic cholecystectomy postoperative day 1. Patient reports mild abdominal soreness but denies nausea and vomiting. Objective: Vitals:   05/29/20 0143 05/29/20 0503 05/29/20 0954 05/29/20 1302  BP: (!) 161/80 (!) 148/76 (!) 145/89 133/73  Pulse: (!) 104 93 91 89  Resp: 18 17 18 18   Temp: 98.6 F (37 C) 98.8 F (37.1 C) 98.8 F (37.1 C) 98.1 F (36.7 C)  TempSrc: Oral Oral Oral Oral  SpO2: 96% 95% 100% 96%  Weight:      Height:        Intake/Output Summary (Last 24 hours) at 05/29/2020 1626 Last data filed at 05/29/2020 1421 Gross per 24 hour  Intake 2071.67 ml  Output 250 ml  Net 1821.67 ml   Filed Weights   05/28/20 1713  Weight: 64.8 kg    Examination:  General exam: Appears calm and comfortable , not in any acute distress. Respiratory system: Clear to auscultation. Respiratory effort normal. Cardiovascular system: S1 & S2 heard, RRR. No JVD, murmurs, rubs, gallops or clicks. No pedal edema. Gastrointestinal system: Abdomen is nondistended, soft, mild soreness noted.  No organomegaly or masses felt.  Normal bowel sounds heard. Central nervous system: Alert and oriented x 3 . No focal neurological deficits. Extremities: No edema, no cyanosis, no clubbing. Skin: No rashes, lesions or ulcers Psychiatry: Judgement and insight  appear normal. Mood & affect appropriate.     Data Reviewed: I have personally reviewed following labs and imaging studies  CBC: Recent Labs  Lab 05/27/20 1102 05/28/20 0009 05/28/20 0500 05/29/20 0350  WBC 17.8*  --  16.1* 10.7*  NEUTROABS  --   --  14.6* 9.7*  HGB 13.2 10.7* 11.8* 11.3*  HCT 39.6 31.5* 35.7* 32.1*  MCV 99.2  --  98.3 95.5  PLT 255  --  223 208   Basic Metabolic Panel: Recent Labs  Lab 05/27/20 1102 05/28/20 0500 05/29/20 0350  NA 135 137 137  K 3.8 3.6 3.1*  CL 100 103 102  CO2 21* 24 24  GLUCOSE 170* 126* 114*  BUN 10 10 13   CREATININE 0.58 0.66 0.63  CALCIUM 9.6 9.2 8.7*  MG  --   --  2.0  PHOS  --   --  2.8   GFR: Estimated Creatinine Clearance: 48.4 mL/min (by C-G formula based on SCr of 0.63 mg/dL). Liver Function Tests: Recent Labs  Lab 05/27/20 1102 05/28/20 0500 05/29/20 0350  AST 28 44* 49*  ALT 22 42 51*  ALKPHOS 54 48 60  BILITOT 0.7 0.8 0.7  PROT 7.4 6.1* 5.8*  ALBUMIN 4.0 3.1*  2.5*   Recent Labs  Lab 05/27/20 1102  LIPASE 25   No results for input(s): AMMONIA in the last 168 hours. Coagulation Profile: No results for input(s): INR, PROTIME in the last 168 hours. Cardiac Enzymes: No results for input(s): CKTOTAL, CKMB, CKMBINDEX, TROPONINI in the last 168 hours. BNP (last 3 results) No results for input(s): PROBNP in the last 8760 hours. HbA1C: Recent Labs    05/29/20 0350  HGBA1C 5.6   CBG: Recent Labs  Lab 05/27/20 2154 05/27/20 2355 05/28/20 0430 05/28/20 0807  GLUCAP 99 123* 115* 114*   Lipid Profile: No results for input(s): CHOL, HDL, LDLCALC, TRIG, CHOLHDL, LDLDIRECT in the last 72 hours. Thyroid Function Tests: No results for input(s): TSH, T4TOTAL, FREET4, T3FREE, THYROIDAB in the last 72 hours. Anemia Panel: No results for input(s): VITAMINB12, FOLATE, FERRITIN, TIBC, IRON, RETICCTPCT in the last 72 hours. Sepsis Labs: Recent Labs  Lab 05/27/20 2020 05/27/20 2205  PROCALCITON  --  0.53    LATICACIDVEN 1.3  --     Recent Results (from the past 240 hour(s))  Culture, blood (routine x 2)     Status: None (Preliminary result)   Collection Time: 05/27/20  8:00 PM   Specimen: BLOOD  Result Value Ref Range Status   Specimen Description BLOOD RIGHT ANTECUBITAL  Final   Special Requests   Final    BOTTLES DRAWN AEROBIC AND ANAEROBIC Blood Culture results may not be optimal due to an excessive volume of blood received in culture bottles   Culture   Final    NO GROWTH 2 DAYS Performed at [facility], [address] [place], [place] [address]    Report Status PENDING  Incomplete  Resp Panel by RT-PCR (Flu A&B, Covid) Nasopharyngeal Swab     Status: None   Collection Time: 05/27/20  8:04 PM   Specimen: Nasopharyngeal Swab; Nasopharyngeal(NP) swabs in vial transport medium  Result Value Ref Range Status   SARS Coronavirus 2 by RT PCR NEGATIVE NEGATIVE Final    Comment: (NOTE) SARS-CoV-2 target nucleic acids are NOT DETECTED.  The SARS-CoV-2 RNA is generally detectable in upper respiratory specimens during the acute phase of infection. The lowest concentration of SARS-CoV-2 viral copies this assay can detect is 138 copies/mL. A negative result does not preclude SARS-Cov-2 infection and should not be used as the sole basis for treatment or other patient management decisions. A negative result may occur with  improper specimen collection/handling, submission of specimen other than nasopharyngeal swab, presence of viral mutation(s) within the areas targeted by this assay, and inadequate number of viral copies(<138 copies/mL). A negative result must be combined with clinical observations, patient history, and epidemiological information. The expected result is Negative.  Fact Sheet for Patients:  BloggerCourse.comhttps://www.fda.gov/media/152166/download  Fact Sheet for Healthcare Providers:  SeriousBroker.ithttps://www.fda.gov/media/152162/download  This test is no t yet approved or cleared by the  [place] FDA and  has been authorized for detection and/or diagnosis of SARS-CoV-2 by FDA under an Emergency Use Authorization (EUA). This EUA will remain  in effect (meaning this test can be used) for the duration of the COVID-19 declaration under Section 564(b)(1) of the Act, 21 U.S.C.section 360bbb-3(b)(1), unless the authorization is terminated  or revoked sooner.       Influenza A by PCR NEGATIVE NEGATIVE Final   Influenza B by PCR NEGATIVE NEGATIVE Final    Comment: (NOTE) The Xpert Xpress SARS-CoV-2/FLU/RSV plus assay is intended as an aid in the diagnosis of influenza from Nasopharyngeal swab  specimens and should not be used as a sole basis for treatment. Nasal washings and aspirates are unacceptable for Xpert Xpress SARS-CoV-2/FLU/RSV testing.  Fact Sheet for Patients: BloggerCourse.com  Fact Sheet for Healthcare Providers: SeriousBroker.it  This test is not yet approved or cleared by the [place] FDA and has been authorized for detection and/or diagnosis of SARS-CoV-2 by FDA under an Emergency Use Authorization (EUA). This EUA will remain in effect (meaning this test can be used) for the duration of the COVID-19 declaration under Section 564(b)(1) of the Act, 21 U.S.C. section 360bbb-3(b)(1), unless the authorization is terminated or revoked.  Performed at [facility], [address] [place], [place] [address]   MRSA PCR Screening     Status: None   Collection Time: 05/27/20  8:44 PM   Specimen: Nasopharyngeal  Result Value Ref Range Status   MRSA by PCR NEGATIVE NEGATIVE Final    Comment:        The GeneXpert MRSA Assay (FDA approved for NASAL specimens only), is one component of a comprehensive MRSA colonization surveillance program. It is not intended to diagnose MRSA infection nor to guide or monitor treatment for MRSA infections. Performed at [facility], [address]  [place], [place] [address]   Culture, blood (routine x 2)     Status: None (Preliminary result)   Collection Time: 05/27/20  8:59 PM   Specimen: BLOOD  Result Value Ref Range Status   Specimen Description BLOOD SITE NOT SPECIFIED  Final   Special Requests   Final    BOTTLES DRAWN AEROBIC AND ANAEROBIC Blood Culture results may not be optimal due to an inadequate volume of blood received in culture bottles   Culture   Final    NO GROWTH 2 DAYS Performed at [facility], [address], [place], [place] [address]    Report Status PENDING  Incomplete    Radiology Studies: CT ABDOMEN PELVIS W CONTRAST  Result Date: 05/27/2020 CLINICAL DATA:  Abdominal pain, generalized but worse on the right. Nausea vomiting. EXAM: CT ABDOMEN AND PELVIS WITH CONTRAST TECHNIQUE: Multidetector CT imaging of the abdomen and pelvis was performed using the standard protocol following bolus administration of intravenous contrast. CONTRAST:  OMNIPAQUE IOHEXOL 300 MG/ML  SOLN COMPARISON:  04/30/2018 FINDINGS: Lower chest: No acute abnormality. Hepatobiliary: Liver normal in size. 5 mm low-attenuation lesion, segment 6. Tiny low-attenuation lesion in segment 7. These are both likely cysts. More definitive cyst in segment 4 B, 1.5 cm. No other liver masses or lesions. Gallbladder is distended. Small amount of fluid attenuation lies between the gallbladder and liver. Small stone in the fundus. Questionable stone in neck/cystic duct. Mild inflammation is seen in the right upper quadrant adjacent to the gallbladder. Trace amount of fluid is seen along the anterior margin of the inferior liver. Prominent common bile duct, 7 mm, with distal tapering, but normal for age. Pancreas: Unremarkable. No pancreatic ductal dilatation or surrounding inflammatory changes. Spleen: Normal in size without focal abnormality. Adrenals/Urinary Tract: No adrenal masses. Kidneys normal in size, orientation and position. 5 mm low-attenuation  cortical lesion, midpole the left kidney, consistent with a cyst. No other masses, no stones and no hydronephrosis. Normal ureters. Normal bladder. Stomach/Bowel: Normal stomach and small bowel. Mild to moderate increased colonic stool burden. No colon wall thickening or inflammation. Vascular/Lymphatic: Aortic atherosclerosis. No aneurysm. No enlarged lymph nodes. Reproductive: Uterus unremarkable. No adnexal masses. Prominent periuterine veins. Other: Small fat containing umbilical hernia. Musculoskeletal:  No fracture or acute finding.  No bone lesion. IMPRESSION: 1. Findings suggest acute cholecystitis. Gallbladder is distended with mild adjacent inflammation. There are gallstones and a small amount of pericholecystic fluid. Consider follow-up right upper quadrant ultrasound more definitive assessment. 2. Trace ascites. 3. No other acute abnormality within the abdomen or pelvis. 4. Aortic atherosclerosis. 5. Mild to moderate increased colonic stool burden. Electronically Signed   By: Amie Portland M.D.   On: 05/27/2020 18:43   Scheduled Meds: . acetaminophen  1,000 mg Oral Q6H  . ascorbic acid  500 mg Oral Daily  . cholecalciferol  1,000 Units Oral Daily  . enoxaparin (LOVENOX) injection  40 mg Subcutaneous Q24H  . melatonin  5 mg Oral QHS  . multivitamin with minerals  1 tablet Oral Daily  . pantoprazole  40 mg Oral BID  . pravastatin  40 mg Oral Daily  . venlafaxine XR  150 mg Oral Q breakfast  . vitamin B-12  1,000 mcg Oral Daily   Continuous Infusions: . sodium chloride Stopped (05/27/20 2048)  . lactated ringers       LOS: 2 days    Time spent: 25 mins.    Cipriano Bunker, MD [facility]   If 7PM-7AM, please contact night-coverage

## 2020-05-29 NOTE — Progress Notes (Signed)
Orthopedic Tech Progress Note Patient Details:  Phyllis [name] [birth year] [id number]  Ortho Devices Type of Ortho Device: Arm sling Ortho Device/Splint Location: RUE Ortho Device/Splint Interventions: Ordered, Application   Post Interventions Patient Tolerated: Well Instructions Provided: Care of device   [name] 05/29/2020, 4:56 PM

## 2020-05-29 NOTE — Progress Notes (Signed)
1 Day Post-Op  Subjective: CC: Patient complains of right shoulder pain. Notes that she was found to have a right clavicle fracture a few days before admission after a fall. She is having some mild abdominal soreness over incisions. She is tolerating liquids without n/v or increased abdominal pain. She walked with assistance to the restroom. Has not walked in the halls. She does not use a cane or walker at home. Lives alone. Voiding without difficulty.   Objective: Vital signs in last 24 hours: Temp:  [97.5 F (36.4 C)-100.2 F (37.9 C)] 98.8 F (37.1 C) (12/06 0954) Pulse Rate:  [91-113] 91 (12/06 0954) Resp:  [16-21] 18 (12/06 0954) BP: (141-169)/(73-98) 145/89 (12/06 0954) SpO2:  [92 %-100 %] 100 % (12/06 0954) Weight:  [64.8 kg] 64.8 kg (12/05 1713) Last BM Date: 05/27/20  Intake/Output from previous day: 12/05 0701 - 12/06 0700 In: 2721.7 [P.O.:100; I.V.:2571.7; IV Piggyback:50] Out: 10 [Blood:10] Intake/Output this shift: Total I/O In: -  Out: 250 [Urine:250]  PE: Gen:  Alert, NAD, pleasant HEENT: EOM's intact, pupils equal and round Card:  RRR Pulm:  CTAB, no W/R/R, effort normal Abd: Soft mild distension, appropriately tender over laparoscopic incisions without peritonitis. +BS. Laparoscopic BS with gauze and tegaderm dressings in place.  Ext:  SCDs in place Psych: A&Ox3  Skin: no rashes noted, warm and dry  Lab Results:  Recent Labs    05/28/20 0500 05/29/20 0350  WBC 16.1* 10.7*  HGB 11.8* 11.3*  HCT 35.7* 32.1*  PLT 223 208   BMET Recent Labs    05/28/20 0500 05/29/20 0350  NA 137 137  K 3.6 3.1*  CL 103 102  CO2 24 24  GLUCOSE 126* 114*  BUN 10 13  CREATININE 0.66 0.63  CALCIUM 9.2 8.7*   PT/INR No results for input(s): LABPROT, INR in the last 72 hours. CMP     Component Value Date/Time   NA 137 05/29/2020 0350   K 3.1 (L) 05/29/2020 0350   CL 102 05/29/2020 0350   CO2 24 05/29/2020 0350   GLUCOSE 114 (H) 05/29/2020 0350   BUN  13 05/29/2020 0350   CREATININE 0.63 05/29/2020 0350   CALCIUM 8.7 (L) 05/29/2020 0350   PROT 5.8 (L) 05/29/2020 0350   ALBUMIN 2.5 (L) 05/29/2020 0350   AST 49 (H) 05/29/2020 0350   ALT 51 (H) 05/29/2020 0350   ALKPHOS 60 05/29/2020 0350   BILITOT 0.7 05/29/2020 0350   GFRNONAA >60 05/29/2020 0350   GFRAA >60 03/10/2018 0405   Lipase     Component Value Date/Time   LIPASE 25 05/27/2020 1102       Studies/Results: CT ABDOMEN PELVIS W CONTRAST  Result Date: 05/27/2020 CLINICAL DATA:  Abdominal pain, generalized but worse on the right. Nausea vomiting. EXAM: CT ABDOMEN AND PELVIS WITH CONTRAST TECHNIQUE: Multidetector CT imaging of the abdomen and pelvis was performed using the standard protocol following bolus administration of intravenous contrast. CONTRAST:  OMNIPAQUE IOHEXOL 300 MG/ML  SOLN COMPARISON:  04/30/2018 FINDINGS: Lower chest: No acute abnormality. Hepatobiliary: Liver normal in size. 5 mm low-attenuation lesion, segment 6. Tiny low-attenuation lesion in segment 7. These are both likely cysts. More definitive cyst in segment 4 B, 1.5 cm. No other liver masses or lesions. Gallbladder is distended. Small amount of fluid attenuation lies between the gallbladder and liver. Small stone in the fundus. Questionable stone in neck/cystic duct. Mild inflammation is seen in the right upper quadrant adjacent to the gallbladder. Trace amount  of fluid is seen along the anterior margin of the inferior liver. Prominent common bile duct, 7 mm, with distal tapering, but normal for age. Pancreas: Unremarkable. No pancreatic ductal dilatation or surrounding inflammatory changes. Spleen: Normal in size without focal abnormality. Adrenals/Urinary Tract: No adrenal masses. Kidneys normal in size, orientation and position. 5 mm low-attenuation cortical lesion, midpole the left kidney, consistent with a cyst. No other masses, no stones and no hydronephrosis. Normal ureters. Normal bladder.  Stomach/Bowel: Normal stomach and small bowel. Mild to moderate increased colonic stool burden. No colon wall thickening or inflammation. Vascular/Lymphatic: Aortic atherosclerosis. No aneurysm. No enlarged lymph nodes. Reproductive: Uterus unremarkable. No adnexal masses. Prominent periuterine veins. Other: Small fat containing umbilical hernia. Musculoskeletal: No fracture or acute finding.  No bone lesion. IMPRESSION: 1. Findings suggest acute cholecystitis. Gallbladder is distended with mild adjacent inflammation. There are gallstones and a small amount of pericholecystic fluid. Consider follow-up right upper quadrant ultrasound more definitive assessment. 2. Trace ascites. 3. No other acute abnormality within the abdomen or pelvis. 4. Aortic atherosclerosis. 5. Mild to moderate increased colonic stool burden. Electronically Signed   By: Amie Portland M.D.   On: 05/27/2020 18:43    Anti-infectives: Anti-infectives (From admission, onward)   Start     Dose/Rate Route Frequency Ordered Stop   05/28/20 1300  piperacillin-tazobactam (ZOSYN) IVPB 3.375 g        3.375 g 12.5 mL/hr over 240 Minutes Intravenous Every 8 hours 05/28/20 0831     05/28/20 0400  piperacillin-tazobactam (ZOSYN) IVPB 3.375 g  Status:  Discontinued        3.375 g 12.5 mL/hr over 240 Minutes Intravenous Every 8 hours 05/27/20 2109 05/28/20 0831   05/27/20 2200  piperacillin-tazobactam (ZOSYN) IVPB 3.375 g  Status:  Discontinued        3.375 g 12.5 mL/hr over 240 Minutes Intravenous Every 12 hours 05/27/20 1850 05/27/20 1854   05/27/20 2200  piperacillin-tazobactam (ZOSYN) IVPB 3.375 g  Status:  Discontinued        3.375 g 100 mL/hr over 30 Minutes Intravenous Every 8 hours 05/27/20 2043 05/27/20 2109   05/27/20 1900  cefTRIAXone (ROCEPHIN) 2 g in sodium chloride 0.9 % 100 mL IVPB  Status:  Discontinued        2 g 200 mL/hr over 30 Minutes Intravenous  Once 05/27/20 1850 05/27/20 1853   05/27/20 1900  piperacillin-tazobactam  (ZOSYN) IVPB 3.375 g        3.375 g 100 mL/hr over 30 Minutes Intravenous  Once 05/27/20 1854 05/27/20 2139       Assessment/Plan HTN HLD Depression Right clavicle fracture  - Per [facility] -   Gangrenous Calculous Cholecystitis S/p Laparoscopic Cholecystectomy with PDS Endoloop around cystic duct stump - Dr. Andrey Campanile - 05/28/2020 - POD #1 - WBC 10.7 from 16.1. Afebrile. Will d/c abx given source control - T. Bili 0.7 - Patient needs to mobilize.   FEN - Reg, K 3.1 (replaced) VTE - SCDs, Lovenox  ID - Zosyn 12/4 - 12/6 Foley - None. Voiding   LOS: 2 days    Jacinto Halim , [facility] 05/29/2020, 11:05 AM Please see Amion for pager number during day hours 7:00am-4:30pm

## 2020-05-29 NOTE — Evaluation (Signed)
Physical Therapy Evaluation Patient Details Name: Phyllis [name] MRN: [id number] DOB: [birth year] Today's Date: 05/29/2020   History of Present Illness  Phyllis [name] is a 79 y.o. female with medical history significant for prior appendectomy, LA grade A esophagitis, gastric ulcer, chronic depression/anxiety, insomnia, prior CVA, essential hypertension, hyperlipidemia, GERD, osteoporosis, who presented to [facility] ED with complaints of sudden onset right-sided abdominal pain, associated with nausea, vomiting and poor oral intake of 1 day duration. now s/p lap chole; of note, pt fell a few days prior to this admission, sustaining a R clavicle fx; pt reports she is to minimize use of her RUE, and not hold anything "heavier than a coffee cup"  Clinical Impression   Patient is s/p above surgery resulting in functional limitations due to the deficits listed below (see PT Problem List). Comes from home where she lives alone; Presents to T with gait and balance dysfunction, increased fall risk, and seems unaware the challenges that minimizing use of her dominant arm can have on ADLs and independence;  Patient will benefit from skilled PT to increase their independence and safety with mobility to allow discharge to the venue listed below.    Unsure of the plan at dc -- would favor her going to her freind's house, where it sounds like she would have more assistance available to her      Follow Up Recommendations Home health PT;Outpatient PT;Supervision - Intermittent (Intermittent, but consistent daily assist; Will consider HHPT initially, and would recommend Outpt PT for gait and balance dysfunction after HH course)    Equipment Recommendations  Cane    Recommendations for Other Services OT consult (ordered per protocol)     Precautions / Restrictions Precautions Precautions: Fall;Other (comment) Precaution Comments: Minimize use of RUE Required Braces or Orthoses: Sling (for comfort)       Mobility  Bed Mobility Overal bed mobility: Needs Assistance Bed Mobility: Supine to Sit     Supine to sit: Supervision     General bed mobility comments: Cues to minimize sue of RUE; tends to push up with it    Transfers Overall transfer level: Needs assistance Equipment used: None Transfers: Sit to/from Stand Sit to Stand: Min guard (without phsyical contact)         General transfer comment: Overall good rise; minguard for safety  Ambulation/Gait Ambulation/Gait assistance: Min guard (with and without physical contact) Gait Distance (Feet): 150 Feet (x2 with one seated reat break) Assistive device: None;IV Pole Gait Pattern/deviations: Step-through pattern (erratic step width)     General Gait Details: Notehworthy erratic step width, indicative of less stability in single limb stance; slight improvement with use of LUE pushing IV pole ( and giving some stability); Cues to self-monitror for activity tolernace  Stairs            Wheelchair Mobility    Modified Rankin (Stroke Patients Only)       Balance Overall balance assessment: Needs assistance                                           Pertinent Vitals/Pain Pain Assessment: Faces Faces Pain Scale: Hurts little more Pain Location: Abdominal discomfort, and R distal clavicle pain Pain Descriptors / Indicators: Aching Pain Intervention(s): Monitored during session    Home Living Family/patient expects to be discharged to:: Private residence Living Arrangements: Alone Available Help at Discharge: Family;Friend(s) (Son  is local and helpful; can stay with a friend as well)   Home Access: Stairs to enter     Home Layout: One level   Additional Comments: May go to her own home (where son can stay 1 day), or to her nurse friend's home, where it sounds like she'll have mroe assit    Prior Function Level of Independence: Independent         Comments: Independent ADLs, IADLs,  amb without device; driving; tells me she went through a time a "few years ago" when she fell a lot "kept tripping over things"; reports no recent falls, except recent fall on 11/30, resulting in clavicle fx     Hand Dominance   Dominant Hand: Right    Extremity/Trunk Assessment   Upper Extremity Assessment Upper Extremity Assessment: RUE deficits/detail RUE Deficits / Details: Bruised R shoulder, and tender to palp at distal clavicle    Lower Extremity Assessment Lower Extremity Assessment: Generalized weakness       Communication   Communication: No difficulties  Cognition Arousal/Alertness: Awake/alert Behavior During Therapy: WFL for tasks assessed/performed Overall Cognitive Status: Within Functional Limits for tasks assessed (for simple tasks) Area of Impairment: Safety/judgement                               General Comments: Doesn't seem to fully inderstand how minimizing use of her dominant arm will effect ADLs      General Comments General comments (skin integrity, edema, etc.): Noting DOE 2/4 during amb, seated rest break, and noted O2 sats were stable, HR elevated    Exercises     Assessment/Plan    PT Assessment Patient needs continued PT services  PT Problem List Decreased strength;Decreased range of motion;Decreased activity tolerance;Decreased balance;Decreased mobility;Decreased coordination;Decreased cognition;Decreased knowledge of use of DME;Decreased safety awareness;Decreased knowledge of precautions       PT Treatment Interventions DME instruction;Gait training;Stair training;Functional mobility training;Therapeutic activities;Therapeutic exercise;Balance training;Neuromuscular re-education;Cognitive remediation;Patient/family education    PT Goals (Current goals can be found in the Care Plan section)  Acute Rehab PT Goals Patient Stated Goal: healing after surgery PT Goal Formulation: With patient Time For Goal Achievement:  06/12/20 Potential to Achieve Goals: Good    Frequency Min 3X/week   Barriers to discharge Other (comment);Decreased caregiver support Unsure of the plan -- would favor her going to her freind's house, where it sounds like she would have more assistance available to her    Co-evaluation               AM-PAC PT "6 Clicks" Mobility  Outcome Measure Help needed turning from your back to your side while in a flat bed without using bedrails?: A Little Help needed moving from lying on your back to sitting on the side of a flat bed without using bedrails?: A Little Help needed moving to and from a bed to a chair (including a wheelchair)?: None Help needed standing up from a chair using your arms (e.g., wheelchair or bedside chair)?: None Help needed to walk in hospital room?: None Help needed climbing 3-5 steps with a railing? : A Little 6 Click Score: 21    End of Session Equipment Utilized During Treatment: Gait belt Activity Tolerance: Patient tolerated treatment well Patient left: in chair;with call bell/phone within reach (preparing to eat)   PT Visit Diagnosis: Unsteadiness on feet (R26.81);Other abnormalities of gait and mobility (R26.89)    Time: 9381-8299 PT Time  Calculation (min) (ACUTE ONLY): 32 min   Charges:   PT Evaluation $PT Eval Moderate Complexity: 1 Mod PT Treatments $Gait Training: 8-22 mins        Van Clines, PT  Acute Rehabilitation Services Pager [phone] Office [phone]   Levi Aland 05/29/2020, 5:11 PM

## 2020-05-30 ENCOUNTER — Inpatient Hospital Stay (HOSPITAL_COMMUNITY): Payer: Medicare Other

## 2020-05-30 LAB — CBC WITH DIFFERENTIAL/PLATELET
Abs Immature Granulocytes: 0.06 10*3/uL (ref 0.00–0.07)
Basophils Absolute: 0 10*3/uL (ref 0.0–0.1)
Basophils Relative: 0 %
Eosinophils Absolute: 0.1 10*3/uL (ref 0.0–0.5)
Eosinophils Relative: 1 %
HCT: 33.2 % — ABNORMAL LOW (ref 36.0–46.0)
Hemoglobin: 11.2 g/dL — ABNORMAL LOW (ref 12.0–15.0)
Immature Granulocytes: 1 %
Lymphocytes Relative: 8 %
Lymphs Abs: 0.8 10*3/uL (ref 0.7–4.0)
MCH: 32.4 pg (ref 26.0–34.0)
MCHC: 33.7 g/dL (ref 30.0–36.0)
MCV: 96 fL (ref 80.0–100.0)
Monocytes Absolute: 0.6 10*3/uL (ref 0.1–1.0)
Monocytes Relative: 5 %
Neutro Abs: 8.8 10*3/uL — ABNORMAL HIGH (ref 1.7–7.7)
Neutrophils Relative %: 85 %
Platelets: 235 10*3/uL (ref 150–400)
RBC: 3.46 MIL/uL — ABNORMAL LOW (ref 3.87–5.11)
RDW: 12.1 % (ref 11.5–15.5)
WBC: 10.3 10*3/uL (ref 4.0–10.5)
nRBC: 0 % (ref 0.0–0.2)

## 2020-05-30 LAB — COMPREHENSIVE METABOLIC PANEL
ALT: 43 U/L (ref 0–44)
AST: 61 U/L — ABNORMAL HIGH (ref 15–41)
Albumin: 2.7 g/dL — ABNORMAL LOW (ref 3.5–5.0)
Alkaline Phosphatase: 84 U/L (ref 38–126)
Anion gap: 11 (ref 5–15)
BUN: 7 mg/dL — ABNORMAL LOW (ref 8–23)
CO2: 24 mmol/L (ref 22–32)
Calcium: 8.8 mg/dL — ABNORMAL LOW (ref 8.9–10.3)
Chloride: 105 mmol/L (ref 98–111)
Creatinine, Ser: 0.57 mg/dL (ref 0.44–1.00)
GFR, Estimated: >60 mL/min (ref >60)
Glucose, Bld: 93 mg/dL (ref 70–99)
Potassium: 3.7 mmol/L (ref 3.5–5.1)
Sodium: 140 mmol/L (ref 135–145)
Total Bilirubin: 1.5 mg/dL — ABNORMAL HIGH (ref 0.3–1.2)
Total Protein: 6 g/dL — ABNORMAL LOW (ref 6.5–8.1)

## 2020-05-30 LAB — SURGICAL PATHOLOGY

## 2020-05-30 MED ORDER — TECHNETIUM TC 99M MEBROFENIN IV KIT
5.4000 | PACK | Freq: Once | INTRAVENOUS | Status: AC | PRN
  Administered 2020-05-30: 5.4 via INTRAVENOUS

## 2020-05-30 MED ORDER — LOSARTAN POTASSIUM 50 MG PO TABS
50.0000 mg | ORAL_TABLET | Freq: Every day | ORAL | Status: DC
  Administered 2020-05-30 – 2020-05-31 (×2): 50 mg via ORAL
  Filled 2020-05-30 (×2): qty 1

## 2020-05-30 NOTE — Evaluation (Signed)
Occupational Therapy Evaluation Patient Details Name: Phyllis [name] MRN: [id number] DOB: 05-09-1941 Today's Date: 05/30/2020    History of Present Illness Phyllis [name] is a 79 y.o. female with medical history significant for prior appendectomy, LA grade A esophagitis, gastric ulcer, chronic depression/anxiety, insomnia, prior CVA, essential hypertension, hyperlipidemia, GERD, osteoporosis, who presented to [facility] ED with complaints of sudden onset right-sided abdominal pain, associated with nausea, vomiting and poor oral intake of 1 day duration. now s/p lap chole; of note, pt fell a few days prior to this admission, sustaining a R clavicle fx; pt reports she is to minimize use of her RUE, and not hold anything "heavier than a coffee cup"   Clinical Impression   Pt PTA Pt independent with ADL and mobility- lives alone. Pt currently, with RUE clavicle fx. Pt currently, SupervisionA for ADL in standing; modified independent with ADL in sitting. Pt donning brace with no difficulty. Pt supervisionA for mobility in room with no AD. Pt performing figure 4 for LB ADL. Pt able to verbalize NWB with RUE and limited use. Pt sit to stands with no use of RUE from commode and bed. Pt does not require continued OT skilled services. OT signing off.    Follow Up Recommendations  No OT follow up;Supervision - Intermittent    Equipment Recommendations  None recommended by OT    Recommendations for Other Services       Precautions / Restrictions Precautions Precautions: Fall;Other (comment) Precaution Comments: RUE sling for comfort; "hold no more than a cofffee cup" Required Braces or Orthoses: Sling Restrictions Weight Bearing Restrictions: No Other Position/Activity Restrictions: RUE sling      Mobility Bed Mobility Overal bed mobility: Needs Assistance Bed Mobility: Supine to Sit;Sit to Supine     Supine to sit: Supervision Sit to supine: Supervision   General bed mobility comments: pt did  not use RUE    Transfers Overall transfer level: Needs assistance Equipment used: Straight cane Transfers: Sit to/from Stand Sit to Stand: Supervision         General transfer comment: no physical assist required    Balance Overall balance assessment: Needs assistance Sitting-balance support: No upper extremity supported Sitting balance-Leahy Scale: Good     Standing balance support: Single extremity supported Standing balance-Leahy Scale: Fair Standing balance comment: dynamic standing balance with grooming tasks in standing                           ADL either performed or assessed with clinical judgement   ADL Overall ADL's : At baseline                                       General ADL Comments: SupervisionA for ADL in standing; modified independent with ADL in sitting. Pt donning brace with no difficulty.     Vision Baseline Vision/History: Wears glasses Wears Glasses: At all times Patient Visual Report: No change from baseline Vision Assessment?: No apparent visual deficits     Perception     Praxis      Pertinent Vitals/Pain Pain Assessment: Faces Faces Pain Scale: Hurts a little bit Pain Location: R clavicle Pain Descriptors / Indicators: Sore Pain Intervention(s): Monitored during session     Hand Dominance Right   Extremity/Trunk Assessment Upper Extremity Assessment Upper Extremity Assessment: RUE deficits/detail RUE Deficits / Details: Distal clavicle fx RUE Coordination:  decreased gross motor   Lower Extremity Assessment Lower Extremity Assessment: Generalized weakness   Cervical / Trunk Assessment Cervical / Trunk Assessment: Normal   Communication Communication Communication: No difficulties   Cognition Arousal/Alertness: Awake/alert Behavior During Therapy: WFL for tasks assessed/performed Overall Cognitive Status: Within Functional Limits for tasks assessed Area of Impairment: Safety/judgement                          Safety/Judgement: Decreased awareness of deficits     General Comments: Pt required 1 cue to not use RUE, but then on, pt respectful of RUE in sling and minimal use   General Comments  SPo2 levels>92% on RA    Exercises Exercises: Other exercises Other Exercises Other Exercises: pt given HEP handout (supine/seated TE) and encouraged to perform TID as able   Shoulder Instructions      Home Living Family/patient expects to be discharged to:: Private residence Living Arrangements: Alone Available Help at Discharge: Family;Friend(s) (son is local; friend is an Char[employer] fundraiser)   Home Access: Stairs to enter     Home Layout: One level     Bathroom Shower/Tub: Tub/shower unit             Additional Comments: May go to her own home (where son can stay 1 day), or to her nurse friend's home, where it sounds like she'll have mroe assit      Prior Functioning/Environment Level of Independence: Independent        Comments: Independent ADLs, IADLs, amb without device; driving; tells me she went through a time a "few years ago" when she fell a lot "kept tripping over things"; reports no recent falls, except recent fall on 11/30, resulting in clavicle fx        OT Problem List: Decreased activity tolerance      OT Treatment/Interventions:      OT Goals(Current goals can be found in the care plan section) Acute Rehab OT Goals Patient Stated Goal: healing after surgery OT Goal Formulation: With patient  OT Frequency:     Barriers to D/C:            Co-evaluation              AM-PAC OT "6 Clicks" Daily Activity     Outcome Measure Help from another person eating meals?: None Help from another person taking care of personal grooming?: None Help from another person toileting, which includes using toliet, bedpan, or urinal?: None Help from another person bathing (including washing, rinsing, drying)?: A Little Help from another person to put on and  taking off regular upper body clothing?: None Help from another person to put on and taking off regular lower body clothing?: None 6 Click Score: 23   End of Session Equipment Utilized During Treatment: Gait belt Nurse Communication: Mobility status  Activity Tolerance: Patient tolerated treatment well Patient left: in bed;with call bell/phone within reach;with bed alarm set  OT Visit Diagnosis: Unsteadiness on feet (R26.81);Muscle weakness (generalized) (M62.81);Pain Pain - Right/Left: Right Pain - part of body: Shoulder                Time: 4097-3532 OT Time Calculation (min): 25 min Charges:  OT General Charges $OT Visit: 1 Visit OT Evaluation $OT Eval Moderate Complexity: 1 Mod OT Treatments $Self Care/Home Management : 8-22 mins  Flora Lipps, OTR/L Acute Rehabilitation Services Pager: [phone] Office: [phone]   Zareena Willis C 05/30/2020, 1:03 PM

## 2020-05-30 NOTE — Progress Notes (Signed)
2 Days Post-Op  Subjective: CC: Patient reports that she did well yesterday. Just had some soreness across her abdomen. Tolerated meatloaf for dinner without increased abdominal pain or associated n/v. About 3 hours ago she reports increased abdominal pain in the mid/right abdomen that was moderate in severity. This resolved after pain medication.  She mobilized with PT yesterday who recommends HH PT.   Objective: Vital signs in last 24 hours: Temp:  [98.1 F (36.7 C)-98.8 F (37.1 C)] 98.7 F (37.1 C) (12/07 0531) Pulse Rate:  [89-99] 96 (12/07 0531) Resp:  [16-20] 16 (12/07 0531) BP: (133-170)/(73-95) 170/95 (12/07 0531) SpO2:  [96 %-100 %] 98 % (12/07 0531) Last BM Date: 05/27/20  Intake/Output from previous day: 12/06 0701 - 12/07 0700 In: 390 [P.O.:390] Out: 250 [Urine:250] Intake/Output this shift: No intake/output data recorded.  PE: Gen:  Alert, NAD, pleasant HEENT: EOM's intact, pupils equal and round Card:  RRR Pulm:  CTAB, no W/R/R, effort normal Abd: Soft mild distension, appropriately tender over laparoscopic incisions. Some epigastric and RUQ tenderness without peritonitis. +BS. Laparoscopic BS with gauze and tegaderm dressings in place.  Ext:  SCDs in place Psych: A&Ox3  Skin: no rashes noted, warm and dry  Lab Results:  Recent Labs    05/28/20 0500 05/29/20 0350  WBC 16.1* 10.7*  HGB 11.8* 11.3*  HCT 35.7* 32.1*  PLT 223 208   BMET Recent Labs    05/29/20 0350 05/30/20 0452  NA 137 140  K 3.1* 3.7  CL 102 105  CO2 24 24  GLUCOSE 114* 93  BUN 13 7*  CREATININE 0.63 0.57  CALCIUM 8.7* 8.8*   PT/INR No results for input(s): LABPROT, INR in the last 72 hours. CMP     Component Value Date/Time   NA 140 05/30/2020 0452   K 3.7 05/30/2020 0452   CL 105 05/30/2020 0452   CO2 24 05/30/2020 0452   GLUCOSE 93 05/30/2020 0452   BUN 7 (L) 05/30/2020 0452   CREATININE 0.57 05/30/2020 0452   CALCIUM 8.8 (L) 05/30/2020 0452   PROT 6.0 (L)  05/30/2020 0452   ALBUMIN 2.7 (L) 05/30/2020 0452   AST 61 (H) 05/30/2020 0452   ALT 43 05/30/2020 0452   ALKPHOS 84 05/30/2020 0452   BILITOT 1.5 (H) 05/30/2020 0452   GFRNONAA >60 05/30/2020 0452   GFRAA >60 03/10/2018 0405   Lipase     Component Value Date/Time   LIPASE 25 05/27/2020 1102       Studies/Results: No results found.  Anti-infectives: Anti-infectives (From admission, onward)   Start     Dose/Rate Route Frequency Ordered Stop   05/28/20 1300  piperacillin-tazobactam (ZOSYN) IVPB 3.375 g  Status:  Discontinued        3.375 g 12.5 mL/hr over 240 Minutes Intravenous Every 8 hours 05/28/20 0831 05/29/20 1116   05/28/20 0400  piperacillin-tazobactam (ZOSYN) IVPB 3.375 g  Status:  Discontinued        3.375 g 12.5 mL/hr over 240 Minutes Intravenous Every 8 hours 05/27/20 2109 05/28/20 0831   05/27/20 2200  piperacillin-tazobactam (ZOSYN) IVPB 3.375 g  Status:  Discontinued        3.375 g 12.5 mL/hr over 240 Minutes Intravenous Every 12 hours 05/27/20 1850 05/27/20 1854   05/27/20 2200  piperacillin-tazobactam (ZOSYN) IVPB 3.375 g  Status:  Discontinued        3.375 g 100 mL/hr over 30 Minutes Intravenous Every 8 hours 05/27/20 2043 05/27/20 2109   05/27/20 1900  cefTRIAXone (ROCEPHIN) 2 g in sodium chloride 0.9 % 100 mL IVPB  Status:  Discontinued        2 g 200 mL/hr over 30 Minutes Intravenous  Once 05/27/20 1850 05/27/20 1853   05/27/20 1900  piperacillin-tazobactam (ZOSYN) IVPB 3.375 g        3.375 g 100 mL/hr over 30 Minutes Intravenous  Once 05/27/20 1854 05/27/20 2139       Assessment/Plan HTN HLD Depression Right clavicle fracture  - Per TRH -   Gangrenous Calculous Cholecystitis S/p Laparoscopic Cholecystectomy with PDS Endoloop around cystic duct stump - Dr. Redmond Pulling - 05/28/2020 - POD #2 - T bili up at 1.5 this AM from 0.7. AST 49 > 61. ALT and Alk phos wnl. WBC pending. Given patients increased pain earlier this AM and elevated T. Bili in the  setting of gangrenous cholecystitis requiring endoloop around cystic duct, will obtain HIDA to r/o bile leak.  - Patient needs to mobilize.   FEN - NPO for HIDA VTE - SCDs, Lovenox  ID - Zosyn 12/4 - 12/6 Foley - None. Voiding   LOS: 3 days    Jillyn Ledger , [facility] 05/30/2020, 8:17 AM Please see Amion for pager number during day hours 7:00am-4:30pm

## 2020-05-30 NOTE — Progress Notes (Signed)
PROGRESS NOTE    Phyllis [name]  YIF:[id number] DOB: August 26, 1940 DOA: 05/27/2020 PCP: [name], MD   Brief Narrative:  The 79 years old female with PMH significant for prior appendicectomy, LA grade A esophagitis, gastric ulcer, chronic depression and anxiety, insomnia, prior CVA, essential hypertension, hyperlipidemia, GERD, osteoporosis presented in the emergency department with sudden onset of right-sided abdominal pain associated with nausea and vomiting and poor PO intake for 1 day. Patient also report mechanical fall 4 days ago causing right clavicular fracture.  CT abdomen and pelvis with contrast shows acute gallstone cholecystitis.  Patient was started on IV Fluids and IV Zosyn,  general surgery was consulted.  Patient is admitted for sepsis secondary to acute cholecystitis.  Patient underwent laparoscopic cholecystectomy with intraoperative cholangiogram on 12/5.  Tolerated well.   Assessment & Plan:   Active Problems:   Acute cholecystitis  Sepsis secondary to acute gallstone cholecystitis: She presented with T-max 101.2, heart rate 109, respiration rate 27, WBC 17.8K.  Lipase level and LFTs were normal.  Lactic acid 1.3.  Procalcitonin 0.53 Continue Tylenol as needed for fever and mild to moderate pain Ordered HIDA scan, which was canceled by general surgery. Blood culture no growth so far. Continued IV Zosyn empirically Continue lactated Ringer at 150 cc/h Maintain MAP greater than 65 General surgery has been consulted, recommended lap chole. Monitor fever curve and WBC Patient underwent laparoscopic cholecystectomy with intraoperative cholangiogram 12/5. She still has mild soreness but denies any nausea and vomiting.   Tolerated clear liquid diet. General surgery ordered HIDA scan to rule out biliary leak.  Tachycardia in the setting of sepsis:  Resolved. EKG shows sinus tachycardia. Continue IV fluid for hydration Monitor on telemetry  Hyperglycemia, no known  history of diabetes. Presented with serum glucose 170 Hemoglobin A1c 5.6 Continue insulin sliding scale Monitor CBGs and avoid hypoglycemia  History of LA grade A esophagitis/gastric ulcer Resume home p.o. Protonix 40 mg twice daily. Hemoglobin is currently stable at 13.2K No overt bleeding at this time. Monitor H&H  Essential hypertension BP stable. Continue losartan. Monitor blood pressure, maintain MAP greater than 65  History of CVA She is not on antiplatelets. Resume home Pravachol  Chronic anxiety/depression Resume home regimen.  Hyperlipidemia LFTs unremarkable. Repeat CMP in the morning Resume home pravastatin  Chronic insomnia Resume home regimen             DVT prophylaxis: Lovenox Code Status: Full code Family Communication: No family in the room Disposition Plan:   Status is: Inpatient  Remains inpatient appropriate because:Inpatient level of care appropriate due to severity of illness   Dispo:  Patient From: Home  Planned Disposition: Home with Health Care Svc  Expected discharge date: 1-2 days.  Medically stable for discharge: No  Consultants:   General surgery  Procedures: Laparoscopic cholecystectomy ,  intraoperative cholangiogram.  Antimicrobials:  Anti-infectives (From admission, onward)   Start     Dose/Rate Route Frequency Ordered Stop   05/28/20 1300  piperacillin-tazobactam (ZOSYN) IVPB 3.375 g  Status:  Discontinued        3.375 g 12.5 mL/hr over 240 Minutes Intravenous Every 8 hours 05/28/20 0831 05/29/20 1116   05/28/20 0400  piperacillin-tazobactam (ZOSYN) IVPB 3.375 g  Status:  Discontinued        3.375 g 12.5 mL/hr over 240 Minutes Intravenous Every 8 hours 05/27/20 2109 05/28/20 0831   05/27/20 2200  piperacillin-tazobactam (ZOSYN) IVPB 3.375 g  Status:  Discontinued  3.375 g 12.5 mL/hr over 240 Minutes Intravenous Every 12 hours 05/27/20 1850 05/27/20 1854   05/27/20 2200  piperacillin-tazobactam (ZOSYN)  IVPB 3.375 g  Status:  Discontinued        3.375 g 100 mL/hr over 30 Minutes Intravenous Every 8 hours 05/27/20 2043 05/27/20 2109   05/27/20 1900  cefTRIAXone (ROCEPHIN) 2 g in sodium chloride 0.9 % 100 mL IVPB  Status:  Discontinued        2 g 200 mL/hr over 30 Minutes Intravenous  Once 05/27/20 1850 05/27/20 1853   05/27/20 1900  piperacillin-tazobactam (ZOSYN) IVPB 3.375 g        3.375 g 100 mL/hr over 30 Minutes Intravenous  Once 05/27/20 1854 05/27/20 2139      Subjective: Patient was seen and examined at bedside.  Overnight events noted.   Patient is status post laparoscopic cholecystectomy postoperative day 2. Patient reports abdominal pain, feels nauseous denies any vomiting.  Objective: Vitals:   05/29/20 1302 05/29/20 2110 05/30/20 0531 05/30/20 1319  BP: 133/73 (!) 163/82 (!) 170/95 (!) 172/91  Pulse: 89 99 96 99  Resp: 18 20 16    Temp: 98.1 F (36.7 C) 98.4 F (36.9 C) 98.7 F (37.1 C) 98 F (36.7 C)  TempSrc: Oral Oral Oral Oral  SpO2: 96% 97% 98% 97%  Weight:      Height:        Intake/Output Summary (Last 24 hours) at 05/30/2020 1620 Last data filed at 05/30/2020 1044 Gross per 24 hour  Intake 480 ml  Output --  Net 480 ml   Filed Weights   05/28/20 1713  Weight: 64.8 kg    Examination:  General exam: Appears calm and comfortable , not in any acute distress. Respiratory system: Clear to auscultation. Respiratory effort normal. Cardiovascular system: S1 & S2 heard, RRR. No JVD, murmurs, rubs, gallops or clicks. No pedal edema. Gastrointestinal system: Abdomen is nondistended, soft, mild soreness noted.  No organomegaly or masses felt.  Normal bowel sounds heard. Central nervous system: Alert and oriented x 3 . No focal neurological deficits. Extremities: No edema, no cyanosis, no clubbing. Skin: No rashes, lesions or ulcers Psychiatry: Judgement and insight appear normal. Mood & affect appropriate.     Data Reviewed: I have personally reviewed  following labs and imaging studies  CBC: Recent Labs  Lab 05/27/20 1102 05/28/20 0009 05/28/20 0500 05/29/20 0350 05/30/20 0808  WBC 17.8*  --  16.1* 10.7* 10.3  NEUTROABS  --   --  14.6* 9.7* 8.8*  HGB 13.2 10.7* 11.8* 11.3* 11.2*  HCT 39.6 31.5* 35.7* 32.1* 33.2*  MCV 99.2  --  98.3 95.5 96.0  PLT 255  --  223 208 235   Basic Metabolic Panel: Recent Labs  Lab 05/27/20 1102 05/28/20 0500 05/29/20 0350 05/30/20 0452  NA 135 137 137 140  K 3.8 3.6 3.1* 3.7  CL 100 103 102 105  CO2 21* 24 24 24   GLUCOSE 170* 126* 114* 93  BUN 10 10 13  7*  CREATININE 0.58 0.66 0.63 0.57  CALCIUM 9.6 9.2 8.7* 8.8*  MG  --   --  2.0  --   PHOS  --   --  2.8  --    GFR: Estimated Creatinine Clearance: 48.4 mL/min (by C-G formula based on SCr of 0.57 mg/dL). Liver Function Tests: Recent Labs  Lab 05/27/20 1102 05/28/20 0500 05/29/20 0350 05/30/20 0452  AST 28 44* 49* 61*  ALT 22 42 51* 43  ALKPHOS 54  48 60 84  BILITOT 0.7 0.8 0.7 1.5*  PROT 7.4 6.1* 5.8* 6.0*  ALBUMIN 4.0 3.1* 2.5* 2.7*   Recent Labs  Lab 05/27/20 1102  LIPASE 25   No results for input(s): AMMONIA in the last 168 hours. Coagulation Profile: No results for input(s): INR, PROTIME in the last 168 hours. Cardiac Enzymes: No results for input(s): CKTOTAL, CKMB, CKMBINDEX, TROPONINI in the last 168 hours. BNP (last 3 results) No results for input(s): PROBNP in the last 8760 hours. HbA1C: Recent Labs    05/29/20 0350  HGBA1C 5.6   CBG: Recent Labs  Lab 05/27/20 2154 05/27/20 2355 05/28/20 0430 05/28/20 0807  GLUCAP 99 123* 115* 114*   Lipid Profile: No results for input(s): CHOL, HDL, LDLCALC, TRIG, CHOLHDL, LDLDIRECT in the last 72 hours. Thyroid Function Tests: No results for input(s): TSH, T4TOTAL, FREET4, T3FREE, THYROIDAB in the last 72 hours. Anemia Panel: No results for input(s): VITAMINB12, FOLATE, FERRITIN, TIBC, IRON, RETICCTPCT in the last 72 hours. Sepsis Labs: Recent Labs  Lab  05/27/20 2020 05/27/20 2205  PROCALCITON  --  0.53  LATICACIDVEN 1.3  --     Recent Results (from the past 240 hour(s))  Culture, blood (routine x 2)     Status: None (Preliminary result)   Collection Time: 05/27/20  8:00 PM   Specimen: BLOOD  Result Value Ref Range Status   Specimen Description BLOOD RIGHT ANTECUBITAL  Final   Special Requests   Final    BOTTLES DRAWN AEROBIC AND ANAEROBIC Blood Culture results may not be optimal due to an excessive volume of blood received in culture bottles   Culture   Final    NO GROWTH 3 DAYS Performed at [facility], [address] [place], [place] [address]    Report Status PENDING  Incomplete  Resp Panel by RT-PCR (Flu A&B, Covid) Nasopharyngeal Swab     Status: None   Collection Time: 05/27/20  8:04 PM   Specimen: Nasopharyngeal Swab; Nasopharyngeal(NP) swabs in vial transport medium  Result Value Ref Range Status   SARS Coronavirus 2 by RT PCR NEGATIVE NEGATIVE Final    Comment: (NOTE) SARS-CoV-2 target nucleic acids are NOT DETECTED.  The SARS-CoV-2 RNA is generally detectable in upper respiratory specimens during the acute phase of infection. The lowest concentration of SARS-CoV-2 viral copies this assay can detect is 138 copies/mL. A negative result does not preclude SARS-Cov-2 infection and should not be used as the sole basis for treatment or other patient management decisions. A negative result may occur with  improper specimen collection/handling, submission of specimen other than nasopharyngeal swab, presence of viral mutation(s) within the areas targeted by this assay, and inadequate number of viral copies(<138 copies/mL). A negative result must be combined with clinical observations, patient history, and epidemiological information. The expected result is Negative.  Fact Sheet for Patients:  BloggerCourse.comhttps://www.fda.gov/media/152166/download  Fact Sheet for Healthcare Providers:   SeriousBroker.ithttps://www.fda.gov/media/152162/download  This test is no t yet approved or cleared by the [place] FDA and  has been authorized for detection and/or diagnosis of SARS-CoV-2 by FDA under an Emergency Use Authorization (EUA). This EUA will remain  in effect (meaning this test can be used) for the duration of the COVID-19 declaration under Section 564(b)(1) of the Act, 21 U.S.C.section 360bbb-3(b)(1), unless the authorization is terminated  or revoked sooner.       Influenza A by PCR NEGATIVE NEGATIVE Final   Influenza B by PCR NEGATIVE NEGATIVE Final    Comment: (NOTE)  The Xpert Xpress SARS-CoV-2/FLU/RSV plus assay is intended as an aid in the diagnosis of influenza from Nasopharyngeal swab specimens and should not be used as a sole basis for treatment. Nasal washings and aspirates are unacceptable for Xpert Xpress SARS-CoV-2/FLU/RSV testing.  Fact Sheet for Patients: BloggerCourse.com  Fact Sheet for Healthcare Providers: SeriousBroker.it  This test is not yet approved or cleared by the [place] FDA and has been authorized for detection and/or diagnosis of SARS-CoV-2 by FDA under an Emergency Use Authorization (EUA). This EUA will remain in effect (meaning this test can be used) for the duration of the COVID-19 declaration under Section 564(b)(1) of the Act, 21 U.S.C. section 360bbb-3(b)(1), unless the authorization is terminated or revoked.  Performed at [facility], [address] [place], [place] [address]   MRSA PCR Screening     Status: None   Collection Time: 05/27/20  8:44 PM   Specimen: Nasopharyngeal  Result Value Ref Range Status   MRSA by PCR NEGATIVE NEGATIVE Final    Comment:        The GeneXpert MRSA Assay (FDA approved for NASAL specimens only), is one component of a comprehensive MRSA colonization surveillance program. It is not intended to diagnose MRSA infection nor to guide  or monitor treatment for MRSA infections. Performed at [facility], [address] [place], [place] [address]   Culture, blood (routine x 2)     Status: None (Preliminary result)   Collection Time: 05/27/20  8:59 PM   Specimen: BLOOD  Result Value Ref Range Status   Specimen Description BLOOD SITE NOT SPECIFIED  Final   Special Requests   Final    BOTTLES DRAWN AEROBIC AND ANAEROBIC Blood Culture results may not be optimal due to an inadequate volume of blood received in culture bottles   Culture   Final    NO GROWTH 3 DAYS Performed at [facility], [address], [place], [place] [address]    Report Status PENDING  Incomplete    Radiology Studies: No results found. Scheduled Meds: . acetaminophen  1,000 mg Oral Q6H  . ascorbic acid  500 mg Oral Daily  . cholecalciferol  1,000 Units Oral Daily  . enoxaparin (LOVENOX) injection  40 mg Subcutaneous Q24H  . melatonin  5 mg Oral QHS  . multivitamin with minerals  1 tablet Oral Daily  . pantoprazole  40 mg Oral BID  . pravastatin  40 mg Oral Daily  . venlafaxine XR  150 mg Oral Q breakfast  . vitamin B-12  1,000 mcg Oral Daily   Continuous Infusions: . sodium chloride Stopped (05/27/20 2048)     LOS: 3 days    Time spent: 25 mins.    Cipriano Bunker, MD [facility]   If 7PM-7AM, please contact night-coverage

## 2020-05-30 NOTE — Progress Notes (Addendum)
Physical Therapy Treatment Patient Details Name: Phyllis [name] MRN: [id number] DOB: 25-Oct-1940 Today's Date: 05/30/2020    History of Present Illness Phyllis [name] is a 79 y.o. female with medical history significant for prior appendectomy, LA grade A esophagitis, gastric ulcer, chronic depression/anxiety, insomnia, prior CVA, essential hypertension, hyperlipidemia, GERD, osteoporosis, who presented to [facility] ED with complaints of sudden onset right-sided abdominal pain, associated with nausea, vomiting and poor oral intake of 1 day duration. now s/p lap chole; of note, pt fell a few days prior to this admission, sustaining a R clavicle fx; pt reports she is to minimize use of her RUE, and not hold anything "heavier than a coffee cup"    PT Comments    Pt up in chair on arrival, agreeable to therapy session and with good participation and tolerance for mobility. Pt able to progress gait distance up to 341ft using SPC and min guard to Supervision needed. Pt min guard for transfer and stair training, able to perform x5 steps with no LOB and cane. Pt given HEP handout and encouraged to perform TID (link: [email or web address] Access Code: [email or web address]) as well as IS use hourly, pt pulling [phone] on IS with teachback. Pt continues to benefit from PT services to progress toward functional mobility goals. D/C recs below remain appropriate, pt reports she may DC to friend's home for increased supervision/assist and I am in agreement this would be safest as her son lives in [place].   Follow Up Recommendations  Home health PT;Outpatient PT;Supervision - Intermittent (consistent daily assist; HHPT progressing to OPPT for balanc)     Equipment Recommendations  Cane    Recommendations for Other Services       Precautions / Restrictions Precautions Precautions: Fall;Other (comment) Precaution Comments: Minimize use of RUE Required Braces or Orthoses: Sling Restrictions Weight Bearing  Restrictions: No Other Position/Activity Restrictions: minimize use of RUE    Mobility  Bed Mobility                  Transfers Overall transfer level: Needs assistance Equipment used: Straight cane Transfers: Sit to/from Stand Sit to Stand: Min guard         General transfer comment: Overall good rise from chair height; minguard for safety  Ambulation/Gait Ambulation/Gait assistance: Min guard Gait Distance (Feet): 300 Feet Assistive device: Straight cane Gait Pattern/deviations: Step-through pattern (some inconsistent foot placement/step width but no overt LOB) Gait velocity: <0.4 m/s   General Gait Details: min guard to Supervision for gait using cane, possible decreased R awareness? pt at times tending to bump into objects on R side, cued for amb in middle of hallway for safety   Stairs Stairs: Yes Stairs assistance: Min guard Stair Management: With cane;Forwards;Step to pattern Number of Stairs: 5 General stair comments: pt forward ascended/backwards descended 5 steps using SPC and needed cues not to reach for rail with RUE (in sling), min guard for safety but no overt LOB; increased time   Wheelchair Mobility    Modified Rankin (Stroke Patients Only)       Balance Overall balance assessment: Needs assistance Sitting-balance support: No upper extremity supported Sitting balance-Leahy Scale: Good     Standing balance support: Single extremity supported Standing balance-Leahy Scale: Fair Standing balance comment: pt able to static stand and step at EOB without cane no LOB but improved confidence/stability using cane  Cognition Arousal/Alertness: Awake/alert Behavior During Therapy: WFL for tasks assessed/performed Overall Cognitive Status: Within Functional Limits for tasks assessed Area of Impairment: Safety/judgement                         Safety/Judgement: Decreased awareness of deficits      General Comments: Doesn't seem to fully inderstand how minimizing use of her dominant arm will effect ADLs      Exercises Other Exercises Other Exercises: pt given HEP handout (supine/seated TE) and encouraged to perform TID as able    General Comments General comments (skin integrity, edema, etc.): mild increased shortness of breath after gait trial, SpO2 94-95% on RA and HR 60's-70's bpm; pt pulling [phone] on IS x5 reps      Pertinent Vitals/Pain Pain Assessment: Faces Faces Pain Scale: Hurts a little bit Pain Location: pt guarding abdomen w transfers but reports minimal discomfort Pain Descriptors / Indicators: Sore Pain Intervention(s): Monitored during session  See comments above for vitals.  Home Living                      Prior Function            PT Goals (current goals can now be found in the care plan section) Acute Rehab PT Goals Patient Stated Goal: healing after surgery PT Goal Formulation: With patient Time For Goal Achievement: 06/12/20 Potential to Achieve Goals: Good Progress towards PT goals: Progressing toward goals    Frequency    Min 3X/week      PT Plan Current plan remains appropriate    Co-evaluation              AM-PAC PT "6 Clicks" Mobility   Outcome Measure  Help needed turning from your back to your side while in a flat bed without using bedrails?: A Little Help needed moving from lying on your back to sitting on the side of a flat bed without using bedrails?: A Little Help needed moving to and from a bed to a chair (including a wheelchair)?: A Little Help needed standing up from a chair using your arms (e.g., wheelchair or bedside chair)?: A Little Help needed to walk in hospital room?: A Little Help needed climbing 3-5 steps with a railing? : A Little 6 Click Score: 18    End of Session Equipment Utilized During Treatment: Gait belt Activity Tolerance: Patient tolerated treatment well Patient left: in  chair;with call bell/phone within reach;with chair alarm set Nurse Communication: Mobility status;Other (comment) (RN notified PT complete for IV to be restarted) PT Visit Diagnosis: Unsteadiness on feet (R26.81);Other abnormalities of gait and mobility (R26.89)     Time: 1035-1100 PT Time Calculation (min) (ACUTE ONLY): 25 min  Charges:  $Gait Training: 23-37 mins                     Kyandre Okray P., PTA Acute Rehabilitation Services Pager: [phone] Office: [phone]   Dorathy Kinsman Altair Appenzeller 05/30/2020, 11:12 AM

## 2020-05-31 LAB — COMPREHENSIVE METABOLIC PANEL
ALT: 37 U/L (ref 0–44)
AST: 28 U/L (ref 15–41)
Albumin: 2.5 g/dL — ABNORMAL LOW (ref 3.5–5.0)
Alkaline Phosphatase: 95 U/L (ref 38–126)
Anion gap: 11 (ref 5–15)
BUN: 6 mg/dL — ABNORMAL LOW (ref 8–23)
CO2: 24 mmol/L (ref 22–32)
Calcium: 8.7 mg/dL — ABNORMAL LOW (ref 8.9–10.3)
Chloride: 104 mmol/L (ref 98–111)
Creatinine, Ser: 0.51 mg/dL (ref 0.44–1.00)
GFR, Estimated: >60 mL/min (ref >60)
Glucose, Bld: 96 mg/dL (ref 70–99)
Potassium: 2.4 mmol/L — CL (ref 3.5–5.1)
Sodium: 139 mmol/L (ref 135–145)
Total Bilirubin: 0.5 mg/dL (ref 0.3–1.2)
Total Protein: 5.8 g/dL — ABNORMAL LOW (ref 6.5–8.1)

## 2020-05-31 LAB — CBC WITH DIFFERENTIAL/PLATELET
Abs Immature Granulocytes: 0.05 10*3/uL (ref 0.00–0.07)
Basophils Absolute: 0 10*3/uL (ref 0.0–0.1)
Basophils Relative: 1 %
Eosinophils Absolute: 0.1 10*3/uL (ref 0.0–0.5)
Eosinophils Relative: 1 %
HCT: 34.7 % — ABNORMAL LOW (ref 36.0–46.0)
Hemoglobin: 11.7 g/dL — ABNORMAL LOW (ref 12.0–15.0)
Immature Granulocytes: 1 %
Lymphocytes Relative: 13 %
Lymphs Abs: 1.1 10*3/uL (ref 0.7–4.0)
MCH: 31.9 pg (ref 26.0–34.0)
MCHC: 33.7 g/dL (ref 30.0–36.0)
MCV: 94.6 fL (ref 80.0–100.0)
Monocytes Absolute: 0.7 10*3/uL (ref 0.1–1.0)
Monocytes Relative: 8 %
Neutro Abs: 6.6 10*3/uL (ref 1.7–7.7)
Neutrophils Relative %: 76 %
Platelets: 253 10*3/uL (ref 150–400)
RBC: 3.67 MIL/uL — ABNORMAL LOW (ref 3.87–5.11)
RDW: 11.9 % (ref 11.5–15.5)
WBC: 8.6 10*3/uL (ref 4.0–10.5)
nRBC: 0 % (ref 0.0–0.2)

## 2020-05-31 LAB — MAGNESIUM: Magnesium: 1.9 mg/dL (ref 1.7–2.4)

## 2020-05-31 LAB — TYPE AND SCREEN
ABO/RH(D): O NEG
Antibody Screen: POSITIVE
Unit division: 0
Unit division: 0

## 2020-05-31 LAB — PHOSPHORUS: Phosphorus: 2.1 mg/dL — ABNORMAL LOW (ref 2.5–4.6)

## 2020-05-31 LAB — BASIC METABOLIC PANEL
Anion gap: 11 (ref 5–15)
BUN: 5 mg/dL — ABNORMAL LOW (ref 8–23)
CO2: 24 mmol/L (ref 22–32)
Calcium: 8.8 mg/dL — ABNORMAL LOW (ref 8.9–10.3)
Chloride: 100 mmol/L (ref 98–111)
Creatinine, Ser: 0.56 mg/dL (ref 0.44–1.00)
GFR, Estimated: >60 mL/min (ref >60)
Glucose, Bld: 102 mg/dL — ABNORMAL HIGH (ref 70–99)
Potassium: 3.1 mmol/L — ABNORMAL LOW (ref 3.5–5.1)
Sodium: 135 mmol/L (ref 135–145)

## 2020-05-31 LAB — BPAM RBC
Blood Product Expiration Date: 202112122359
Blood Product Expiration Date: 202112192359
ISSUE DATE / TIME: 202112041755
ISSUE DATE / TIME: 202112041755
Unit Type and Rh: 9500
Unit Type and Rh: 9500

## 2020-05-31 MED ORDER — OXYCODONE HCL 5 MG PO TABS: 5.0000 mg | ORAL_TABLET | Freq: Four times a day (QID) | ORAL | 0 refills | Status: DC | PRN

## 2020-05-31 MED ORDER — MELATONIN 10 MG PO TABS: 1.0000 | ORAL_TABLET | Freq: Every day | ORAL | Status: AC

## 2020-05-31 MED ORDER — K PHOS MONO-SOD PHOS DI & MONO 155-852-130 MG PO TABS: 500.0000 mg | ORAL_TABLET | Freq: Three times a day (TID) | ORAL | 0 refills | Status: AC

## 2020-05-31 MED ORDER — DOCUSATE SODIUM 100 MG PO CAPS
100.0000 mg | ORAL_CAPSULE | Freq: Two times a day (BID) | ORAL | Status: DC
  Administered 2020-05-31: 100 mg via ORAL
  Filled 2020-05-31: qty 1

## 2020-05-31 MED ORDER — POTASSIUM CHLORIDE 10 MEQ/100ML IV SOLN
10.0000 meq | INTRAVENOUS | Status: DC
  Administered 2020-05-31 (×3): 10 meq via INTRAVENOUS
  Filled 2020-05-31 (×3): qty 100

## 2020-05-31 MED ORDER — POLYETHYLENE GLYCOL 3350 17 G PO PACK: 17.0000 g | PACK | Freq: Every day | ORAL | 0 refills | Status: DC | PRN

## 2020-05-31 MED ORDER — BISACODYL 10 MG RE SUPP: 10.0000 mg | Freq: Every day | RECTAL | Status: DC | PRN

## 2020-05-31 MED ORDER — ACETAMINOPHEN 325 MG PO TABS: 650.0000 mg | ORAL_TABLET | Freq: Four times a day (QID) | ORAL | Status: AC | PRN

## 2020-05-31 MED ORDER — POTASSIUM CHLORIDE CRYS ER 20 MEQ PO TBCR
40.0000 meq | EXTENDED_RELEASE_TABLET | Freq: Once | ORAL | Status: AC
  Administered 2020-05-31: 40 meq via ORAL
  Filled 2020-05-31: qty 2

## 2020-05-31 MED ORDER — POLYETHYLENE GLYCOL 3350 17 G PO PACK
17.0000 g | PACK | Freq: Every day | ORAL | Status: DC
  Administered 2020-05-31: 17 g via ORAL
  Filled 2020-05-31: qty 1

## 2020-05-31 MED ORDER — POTASSIUM PHOSPHATES 15 MMOLE/5ML IV SOLN
30.0000 mmol | Freq: Once | INTRAVENOUS | Status: DC
  Filled 2020-05-31: qty 10

## 2020-05-31 MED ORDER — K PHOS MONO-SOD PHOS DI & MONO 155-852-130 MG PO TABS
500.0000 mg | ORAL_TABLET | Freq: Three times a day (TID) | ORAL | Status: DC
  Administered 2020-05-31: 500 mg via ORAL
  Filled 2020-05-31 (×2): qty 2

## 2020-05-31 MED ORDER — MAGNESIUM 250 MG PO TABS: 1.0000 | ORAL_TABLET | Freq: Every day | ORAL | Status: AC

## 2020-05-31 MED ORDER — POTASSIUM CHLORIDE CRYS ER 20 MEQ PO TBCR
40.0000 meq | EXTENDED_RELEASE_TABLET | ORAL | Status: AC
  Administered 2020-05-31 (×2): 40 meq via ORAL
  Filled 2020-05-31 (×2): qty 2

## 2020-05-31 MED ORDER — POTASSIUM CHLORIDE CRYS ER 20 MEQ PO TBCR
30.0000 meq | EXTENDED_RELEASE_TABLET | Freq: Two times a day (BID) | ORAL | Status: DC
  Administered 2020-05-31: 30 meq via ORAL
  Filled 2020-05-31: qty 1

## 2020-05-31 NOTE — Discharge Summary (Signed)
Physician Discharge Summary  Phyllis [name] ZOX:[id number] DOB: 1941-04-23  PCP: [name], MD  Admitted from: Home Discharged to: Home  Admit date: 05/27/2020 Discharge date: 05/31/2020  Recommendations for Outpatient Follow-up:    Follow-up Information    [facility]  Follow up on 06/20/2020.   Specialty: General Surgery Why: 8:30 am. Please arrive 30 minutes prior to your appointment for paperwork. Please bring a copy of your photo ID and insurance card.  Contact information: [address] [facility] [place] [address] [phone]        [name], MD. Schedule an appointment as soon as possible for a visit in 1 week(s).   Specialty: Family Medicine Why: To be seen with repeat labs (CBC, BMP, magnesium and phosphorus). Contact information: [address] [place] [address] [phone]                Home Health: Outpatient PT.  Patient declined home health PT.    Equipment/Devices: None.  Patient has right upper extremity sling from outpatient orthopedic office visit.    Discharge Condition: Improved and stable.   Code Status: Full Code Diet recommendation:  Discharge Diet Orders (From admission, onward)    Start     Ordered   05/31/20 0000  Diet - low sodium heart healthy        05/31/20 1620           Discharge Diagnoses:  Active Problems:   Acute cholecystitis   Brief Summary: 79 year old female with PMH of prior appendectomy, LA grade a esophagitis, gastric ulcer, chronic depression and anxiety, insomnia, prior CVA, essential hypertension, hyperlipidemia, GERD, osteoporosis, presented to the ED with sudden onset of right sided abdominal pain associated with nausea and vomiting and poor oral intake of 1 day duration.  She also reported a mechanical fall 4 days prior causing right clavicular fracture, had been seen by orthopedics/[facility], no surgical intervention advised and was placed on right upper extremity  sling.  CT abdomen and pelvis with contrast showed acute gallstone cholecystitis.  Patient was started on IV fluids, IV Zosyn and general surgery was consulted.  She was admitted for sepsis secondary to acute cholecystitis.  She underwent laparoscopic cholecystectomy with intraoperative cholangiogram on 05/28/2020.  Assessment and plan:  Sepsis secondary to acute gangrenous calculus cholecystitis She presented with T-max of 101.2, tachycardia 109/min, tachypnea 27/min and leukocytosis 17 point 8K. Lipase and LFTs were normal.  Lactic acid 1.3.  Procalcitonin 0.53. Blood cultures x2: Negative to date. She was treated with IV fluids and IV Zosyn. Patient underwent laparoscopic cholecystectomy with PDS Endoloop around cystic duct stump on 05/28/2020 by Dr Andrey Campanile. Sepsis physiology resolved.  Tachycardia improved. On 05/30/2020, due to some worsening of abdominal pain and elevated total bilirubin, to rule out bile leak, HIDA scan was obtained and negative. General surgery followed up today.  Patient has tolerated regular consistency diet without worsening of abdominal pain, nausea or vomiting.  Appetite is still low but slowly improving.  LFTs normalized.  Surgery has cleared for discharge home and have arranged outpatient follow-up.  Patient completed course of IV antibiotics.  Hypokalemia Replaced aggressively on day of discharge.  Close outpatient follow-up with repeat labs.  Hypomagnesemia: Replace orally and outpatient follow-up with repeat labs.  Recent right clavicular fracture: Continue right upper extremity sling and outpatient follow-up with orthopedics.  Essential hypertension: Mildly uncontrolled at times.  Continue prior home dose of losartan.  Close outpatient follow-up and adjust medications  as needed.  Hyperlipidemia: Continue statins.  Chronic anxiety/depression/insomnia: Continue prior home regimen.   Polypharmacy multivitamins Confirmed with patient that she only takes 1  tablet of the magnesium and 10 mg nightly melatonin.  Hyperglycemia without diagnosis of diabetes A1c 5.7.  Likely mediated by acute stress.  History of LA grade a esophagitis/gastric ulcer Continue prior home dose of PPI.  History of CVA: Not on antiplatelets PTA.  Continue home dose of statins.  Constipation Surgeons have initiated bowel regimen.  Acute blood loss anemia, mild Hemoglobin stable.  Outpatient follow-up.   Consultations:  General surgery  Procedures:  As noted above   Discharge Instructions  Discharge Instructions    Ambulatory referral to Physical Therapy   Complete by: As directed    Iontophoresis - 4 mg/ml of dexamethasone: No   T.E.N.S. Unit Evaluation and Dispense as Indicated: No   Call MD for:  difficulty breathing, headache or visual disturbances   Complete by: As directed    Call MD for:  extreme fatigue   Complete by: As directed    Call MD for:  persistant dizziness or light-headedness   Complete by: As directed    Call MD for:  persistant nausea and vomiting   Complete by: As directed    Call MD for:  redness, tenderness, or signs of infection (pain, swelling, redness, odor or green/yellow discharge around incision site)   Complete by: As directed    Call MD for:  severe uncontrolled pain   Complete by: As directed    Call MD for:  temperature >100.4   Complete by: As directed    Diet - low sodium heart healthy   Complete by: As directed    Increase activity slowly   Complete by: As directed    No wound care   Complete by: As directed        Medication List    TAKE these medications   acetaminophen 325 MG tablet Commonly known as: TYLENOL Take 2 tablets (650 mg total) by mouth every 6 (six) hours as needed for mild pain, moderate pain or fever.   alendronate 70 MG tablet Commonly known as: FOSAMAX Take 70 mg by mouth every Saturday.   butalbital-acetaminophen-caffeine 50-325-40 MG tablet Commonly known as: FIORICET Take  1 tablet by mouth 2 (two) times daily as needed for headache.   High Potency Iron 65 MG Tabs Take 1 tablet by mouth daily.   hydrOXYzine 10 MG tablet Commonly known as: ATARAX/VISTARIL Take 5 mg by mouth at bedtime as needed (For sleep).   losartan 50 MG tablet Commonly known as: COZAAR Take 50 mg by mouth daily.   Magnesium 250 MG Tabs Take 1 tablet (250 mg total) by mouth daily. What changed: how much to take   Melatonin 10 MG Tabs Take 1 tablet by mouth at bedtime. What changed: how much to take   multivitamin tablet Take 1 tablet by mouth daily.   oxyCODONE 5 MG immediate release tablet Commonly known as: Oxy IR/ROXICODONE Take 1 tablet (5 mg total) by mouth every 6 (six) hours as needed for breakthrough pain.   pantoprazole 40 MG tablet Commonly known as: PROTONIX Take 1 tablet (40 mg total) by mouth 2 (two) times daily. What changed: when to take this   phosphorus 155-852-130 MG tablet Commonly known as: K PHOS NEUTRAL Take 2 tablets (500 mg total) by mouth 3 (three) times daily for 6 doses.   polyethylene glycol 17 g packet Commonly known as: MiraLax Take 17  g by mouth daily as needed for mild constipation.   pravastatin 40 MG tablet Commonly known as: PRAVACHOL Take 40 mg by mouth daily.   venlafaxine XR 150 MG 24 hr capsule Commonly known as: EFFEXOR-XR Take 150 mg by mouth daily with breakfast.   VITAMIN B COMPLEX-C PO Take 1 tablet by mouth daily.   vitamin B-12 1000 MCG tablet Commonly known as: CYANOCOBALAMIN Take 1,000 mcg by mouth daily.   vitamin C 100 MG tablet Take 100 mg by mouth daily.   Vitamin D3 25 MCG (1000 UT) Caps Take 1 capsule by mouth daily.      Allergies  Allergen Reactions  . Azithromycin     Developed a rash right after she quit taking it      Procedures/Studies:  CT ABDOMEN PELVIS W CONTRAST  Result Date: 05/27/2020 CLINICAL DATA:  Abdominal pain, generalized but worse on the right. Nausea vomiting. EXAM: CT  ABDOMEN AND PELVIS WITH CONTRAST TECHNIQUE: Multidetector CT imaging of the abdomen and pelvis was performed using the standard protocol following bolus administration of intravenous contrast. CONTRAST:  OMNIPAQUE IOHEXOL 300 MG/ML  SOLN COMPARISON:  04/30/2018 FINDINGS: Lower chest: No acute abnormality. Hepatobiliary: Liver normal in size. 5 mm low-attenuation lesion, segment 6. Tiny low-attenuation lesion in segment 7. These are both likely cysts. More definitive cyst in segment 4 B, 1.5 cm. No other liver masses or lesions. Gallbladder is distended. Small amount of fluid attenuation lies between the gallbladder and liver. Small stone in the fundus. Questionable stone in neck/cystic duct. Mild inflammation is seen in the right upper quadrant adjacent to the gallbladder. Trace amount of fluid is seen along the anterior margin of the inferior liver. Prominent common bile duct, 7 mm, with distal tapering, but normal for age. Pancreas: Unremarkable. No pancreatic ductal dilatation or surrounding inflammatory changes. Spleen: Normal in size without focal abnormality. Adrenals/Urinary Tract: No adrenal masses. Kidneys normal in size, orientation and position. 5 mm low-attenuation cortical lesion, midpole the left kidney, consistent with a cyst. No other masses, no stones and no hydronephrosis. Normal ureters. Normal bladder. Stomach/Bowel: Normal stomach and small bowel. Mild to moderate increased colonic stool burden. No colon wall thickening or inflammation. Vascular/Lymphatic: Aortic atherosclerosis. No aneurysm. No enlarged lymph nodes. Reproductive: Uterus unremarkable. No adnexal masses. Prominent periuterine veins. Other: Small fat containing umbilical hernia. Musculoskeletal: No fracture or acute finding.  No bone lesion. IMPRESSION: 1. Findings suggest acute cholecystitis. Gallbladder is distended with mild adjacent inflammation. There are gallstones and a small amount of pericholecystic fluid. Consider  follow-up right upper quadrant ultrasound more definitive assessment. 2. Trace ascites. 3. No other acute abnormality within the abdomen or pelvis. 4. Aortic atherosclerosis. 5. Mild to moderate increased colonic stool burden. Electronically Signed   By: Amie Portland M.D.   On: 05/27/2020 18:43   NM HEPATO BILIARY LEAK  Result Date: 05/30/2020 CLINICAL DATA:  Laparoscopic cholecystectomy 05/28/2020. Persistent abdominal pain. EXAM: NUCLEAR MEDICINE HEPATOBILIARY IMAGING TECHNIQUE: Sequential images of the abdomen were obtained out to 60 minutes following intravenous administration of radiopharmaceutical. RADIOPHARMACEUTICALS:  5.4 mCi Tc-33m  Choletec IV COMPARISON:  CT 05/27/2020 FINDINGS: Rapid clearance of radiotracer from the blood pool and homogeneous uptake in liver. Counts are evident in the small bowel by 20 minutes. Imaging proceeded over 2 hours. There is no evidence of extraluminal bile radiotracer activity. IMPRESSION: 1. No bile leak. 2. Patent common duct. Electronically Signed   By: Genevive Bi M.D.   On: 05/30/2020 19:20  Subjective: Patient denies complaints.  Tolerating diet but not eating much.  Does not seem to like the hospital food.  Appropriate mild soreness at laparoscopic sites but otherwise no significant abdominal pain, nausea or vomiting.  She usually lives alone but tonight her son will be staying with her and from tomorrow she will be going to stay with her friend who is also an Char[employer] fundraiser who will provide 24/7 supervision and assistance.  Discharge Exam:  Vitals:   05/30/20 1732 05/30/20 2024 05/31/20 0537 05/31/20 1129  BP: (!) 180/97 (!) 168/100 (!) 167/90 (!) 181/94  Pulse: (!) 105 (!) 103 98 (!) 103  Resp: 20 18 17    Temp: 98.1 F (36.7 C) 98.3 F (36.8 C)    TempSrc: Oral Oral    SpO2:  96% 99% 100%  Weight:      Height:        General: Pt lying comfortably in bed & appears in no obvious distress. Cardiovascular: S1 & S2 heard, RRR, S1/S2 +. No  murmurs, rubs, gallops or clicks. No JVD or pedal edema. Respiratory: Clear to auscultation without wheezing, rhonchi or crackles. No increased work of breathing. Abdominal:  Non distended, non tender & soft. No organomegaly or masses appreciated. Normal bowel sounds heard.  Laparoscopic sites without acute findings. CNS: Alert and oriented. No focal deficits. Extremities: no edema, no cyanosis    The results of significant diagnostics from this hospitalization (including imaging, microbiology, ancillary and laboratory) are listed below for reference.     Microbiology: Recent Results (from the past 240 hour(s))  Culture, blood (routine x 2)     Status: None (Preliminary result)   Collection Time: 05/27/20  8:00 PM   Specimen: BLOOD  Result Value Ref Range Status   Specimen Description BLOOD RIGHT ANTECUBITAL  Final   Special Requests   Final    BOTTLES DRAWN AEROBIC AND ANAEROBIC Blood Culture results may not be optimal due to an excessive volume of blood received in culture bottles   Culture   Final    NO GROWTH 4 DAYS Performed at [facility], [address], [place], [place]    Report Status PENDING  Incomplete  Resp Panel by RT-PCR (Flu A&B, Covid) Nasopharyngeal Swab     Status: None   Collection Time: 05/27/20  8:04 PM   Specimen: Nasopharyngeal Swab; Nasopharyngeal(NP) swabs in vial transport medium  Result Value Ref Range Status   SARS Coronavirus 2 by RT PCR NEGATIVE NEGATIVE Final    Comment: (NOTE) SARS-CoV-2 target nucleic acids are NOT DETECTED.  The SARS-CoV-2 RNA is generally detectable in upper respiratory specimens during the acute phase of infection. The lowest concentration of SARS-CoV-2 viral copies this assay can detect is 138 copies/mL. A negative result does not preclude SARS-Cov-2 infection and should not be used as the sole basis for treatment or other patient management decisions. A negative result may occur with  improper specimen  collection/handling, submission of specimen other than nasopharyngeal swab, presence of viral mutation(s) within the areas targeted by this assay, and inadequate number of viral copies(<138 copies/mL). A negative result must be combined with clinical observations, patient history, and epidemiological information. The expected result is Negative.  Fact Sheet for Patients:  14/04/21  Fact Sheet for Healthcare Providers:  BloggerCourse.com  This test is no t yet approved or cleared by the [place] FDA and  has been authorized for detection and/or diagnosis of SARS-CoV-2 by FDA under an Emergency Use Authorization (EUA). This  EUA will remain  in effect (meaning this test can be used) for the duration of the COVID-19 declaration under Section 564(b)(1) of the Act, 21 U.S.C.section 360bbb-3(b)(1), unless the authorization is terminated  or revoked sooner.       Influenza A by PCR NEGATIVE NEGATIVE Final   Influenza B by PCR NEGATIVE NEGATIVE Final    Comment: (NOTE) The Xpert Xpress SARS-CoV-2/FLU/RSV plus assay is intended as an aid in the diagnosis of influenza from Nasopharyngeal swab specimens and should not be used as a sole basis for treatment. Nasal washings and aspirates are unacceptable for Xpert Xpress SARS-CoV-2/FLU/RSV testing.  Fact Sheet for Patients: BloggerCourse.comhttps://www.fda.gov/media/152166/download  Fact Sheet for Healthcare Providers: SeriousBroker.ithttps://www.fda.gov/media/152162/download  This test is not yet approved or cleared by the [place] FDA and has been authorized for detection and/or diagnosis of SARS-CoV-2 by FDA under an Emergency Use Authorization (EUA). This EUA will remain in effect (meaning this test can be used) for the duration of the COVID-19 declaration under Section 564(b)(1) of the Act, 21 U.S.C. section 360bbb-3(b)(1), unless the authorization is terminated or revoked.  Performed at [facility], [address] [place], [place] [address]   MRSA PCR Screening     Status: None   Collection Time: 05/27/20  8:44 PM   Specimen: Nasopharyngeal  Result Value Ref Range Status   MRSA by PCR NEGATIVE NEGATIVE Final    Comment:        The GeneXpert MRSA Assay (FDA approved for NASAL specimens only), is one component of a comprehensive MRSA colonization surveillance program. It is not intended to diagnose MRSA infection nor to guide or monitor treatment for MRSA infections. Performed at [facility], [address] [place], [place] [address]   Culture, blood (routine x 2)     Status: None (Preliminary result)   Collection Time: 05/27/20  8:59 PM   Specimen: BLOOD  Result Value Ref Range Status   Specimen Description BLOOD SITE NOT SPECIFIED  Final   Special Requests   Final    BOTTLES DRAWN AEROBIC AND ANAEROBIC Blood Culture results may not be optimal due to an inadequate volume of blood received in culture bottles   Culture   Final    NO GROWTH 4 DAYS Performed at [facility], [address] [place], [place] [address]    Report Status PENDING  Incomplete     Labs: CBC: Recent Labs  Lab 05/27/20 1102 05/27/20 1102 05/28/20 0009 05/28/20 0500 05/29/20 0350 05/30/20 0808 05/31/20 0050  WBC 17.8*  --   --  16.1* 10.7* 10.3 8.6  NEUTROABS  --   --   --  14.6* 9.7* 8.8* 6.6  HGB 13.2   < > 10.7* 11.8* 11.3* 11.2* 11.7*  HCT 39.6   < > 31.5* 35.7* 32.1* 33.2* 34.7*  MCV 99.2  --   --  98.3 95.5 96.0 94.6  PLT 255  --   --  223 208 235 253   < > = values in this interval not displayed.    Basic Metabolic Panel: Recent Labs  Lab 05/28/20 0500 05/29/20 0350 05/30/20 0452 05/31/20 0050 05/31/20 1408  NA 137 137 140 139 135  K 3.6 3.1* 3.7 2.4* 3.1*  CL 103 102 105 104 100  CO2 24 24 24 24 24   GLUCOSE 126* 114* 93 96 102*  BUN 10 13 7* 6* 5*  CREATININE 0.66 0.63 0.57 0.51 0.56  CALCIUM 9.2 8.7* 8.8* 8.7* 8.8*  MG  --  2.0  --  1.9   --   PHOS  --  2.8  --  2.1*  --     Liver Function Tests: Recent Labs  Lab 05/27/20 1102 05/28/20 0500 05/29/20 0350 05/30/20 0452 05/31/20 0050  AST 28 44* 49* 61* 28  ALT 22 42 51* 43 37  ALKPHOS 54 48 60 84 95  BILITOT 0.7 0.8 0.7 1.5* 0.5  PROT 7.4 6.1* 5.8* 6.0* 5.8*  ALBUMIN 4.0 3.1* 2.5* 2.7* 2.5*    CBG: Recent Labs  Lab 05/27/20 2154 05/27/20 2355 05/28/20 0430 05/28/20 0807  GLUCAP 99 123* 115* 114*    Hgb A1c Recent Labs    05/29/20 0350  HGBA1C 5.6     Urinalysis    Component Value Date/Time   COLORURINE YELLOW 05/27/2020 2140   APPEARANCEUR CLEAR 05/27/2020 2140   LABSPEC 1.027 05/27/2020 2140   PHURINE 7.0 05/27/2020 2140   GLUCOSEU NEGATIVE 05/27/2020 2140   HGBUR SMALL (A) 05/27/2020 2140   BILIRUBINUR NEGATIVE 05/27/2020 2140   KETONESUR NEGATIVE 05/27/2020 2140   PROTEINUR NEGATIVE 05/27/2020 2140   NITRITE NEGATIVE 05/27/2020 2140   LEUKOCYTESUR NEGATIVE 05/27/2020 2140      Time coordinating discharge: 35 minutes  SIGNED:  Marcellus Scott, MD, FACP, [facility]. [facility]  To contact the attending provider between 7A-7P or the covering provider during after hours 7P-7A, please log into the web site [email or web address] and access using universal [place] password for that web site. If you do not have the password, please call the hospital operator.

## 2020-05-31 NOTE — Progress Notes (Addendum)
3 Days Post-Op  Subjective: CC: Doing well.  Patient reports some abdominal soreness over her laparoscopic incision sites.  She has had no further episodes of pain like she had yesterday morning.  She did not require any additional pain medication besides 1 dose of Oxy yesterday morning.  Patient was n.p.o. for most of the day yesterday for HIDA scan.  This was negative.  She reports that she had some clears last night but has not had solids since HIDA scan.  She denies any nausea or emesis.  Worked with therapies yesterday.  Voiding. Passing flatus. No BM since 12/4.  Objective: Vital signs in last 24 hours: Temp:  [98 F (36.7 C)-98.3 F (36.8 C)] 98.3 F (36.8 C) (12/07 2024) Pulse Rate:  [98-105] 98 (12/08 0537) Resp:  [17-20] 17 (12/08 0537) BP: (167-180)/(90-100) 167/90 (12/08 0537) SpO2:  [96 %-99 %] 99 % (12/08 0537) Last BM Date: 05/27/20  Intake/Output from previous day: 12/07 0701 - 12/08 0700 In: 480 [P.O.:480] Out: 250 [Urine:250] Intake/Output this shift: No intake/output data recorded.  PE: Gen: Alert, NAD, pleasant HEENT: EOM's intact, pupils equal and round Card: RRR Pulm: CTAB, no W/R/R, effort normal Abd: Softmild distension, appropriately tender over laparoscopic incisions otherwise NT. +BS. Tegaderm dressings removed and laparoscopic sites with steri-strips in place, c/d/i. Ext:No LE edema  Psych: A&Ox3  Skin: no rashes noted, warm and dry  Lab Results:  Recent Labs    05/30/20 0808 05/31/20 0050  WBC 10.3 8.6  HGB 11.2* 11.7*  HCT 33.2* 34.7*  PLT 235 253   BMET Recent Labs    05/30/20 0452 05/31/20 0050  NA 140 139  K 3.7 2.4*  CL 105 104  CO2 24 24  GLUCOSE 93 96  BUN 7* 6*  CREATININE 0.57 0.51  CALCIUM 8.8* 8.7*   PT/INR No results for input(s): LABPROT, INR in the last 72 hours. CMP     Component Value Date/Time   NA 139 05/31/2020 0050   K 2.4 (LL) 05/31/2020 0050   CL 104 05/31/2020 0050   CO2 24 05/31/2020  0050   GLUCOSE 96 05/31/2020 0050   BUN 6 (L) 05/31/2020 0050   CREATININE 0.51 05/31/2020 0050   CALCIUM 8.7 (L) 05/31/2020 0050   PROT 5.8 (L) 05/31/2020 0050   ALBUMIN 2.5 (L) 05/31/2020 0050   AST 28 05/31/2020 0050   ALT 37 05/31/2020 0050   ALKPHOS 95 05/31/2020 0050   BILITOT 0.5 05/31/2020 0050   GFRNONAA >60 05/31/2020 0050   GFRAA >60 03/10/2018 0405   Lipase     Component Value Date/Time   LIPASE 25 05/27/2020 1102       Studies/Results: NM HEPATO BILIARY LEAK  Result Date: 05/30/2020 CLINICAL DATA:  Laparoscopic cholecystectomy 05/28/2020. Persistent abdominal pain. EXAM: NUCLEAR MEDICINE HEPATOBILIARY IMAGING TECHNIQUE: Sequential images of the abdomen were obtained out to 60 minutes following intravenous administration of radiopharmaceutical. RADIOPHARMACEUTICALS:  5.4 mCi Tc-24m  Choletec IV COMPARISON:  CT 05/27/2020 FINDINGS: Rapid clearance of radiotracer from the blood pool and homogeneous uptake in liver. Counts are evident in the small bowel by 20 minutes. Imaging proceeded over 2 hours. There is no evidence of extraluminal bile radiotracer activity. IMPRESSION: 1. No bile leak. 2. Patent common duct. Electronically Signed   By: Genevive Bi M.D.   On: 05/30/2020 19:20    Anti-infectives: Anti-infectives (From admission, onward)   Start     Dose/Rate Route Frequency Ordered Stop   05/28/20 1300  piperacillin-tazobactam (ZOSYN) IVPB  3.375 g  Status:  Discontinued        3.375 g 12.5 mL/hr over 240 Minutes Intravenous Every 8 hours 05/28/20 0831 05/29/20 1116   05/28/20 0400  piperacillin-tazobactam (ZOSYN) IVPB 3.375 g  Status:  Discontinued        3.375 g 12.5 mL/hr over 240 Minutes Intravenous Every 8 hours 05/27/20 2109 05/28/20 0831   05/27/20 2200  piperacillin-tazobactam (ZOSYN) IVPB 3.375 g  Status:  Discontinued        3.375 g 12.5 mL/hr over 240 Minutes Intravenous Every 12 hours 05/27/20 1850 05/27/20 1854   05/27/20 2200   piperacillin-tazobactam (ZOSYN) IVPB 3.375 g  Status:  Discontinued        3.375 g 100 mL/hr over 30 Minutes Intravenous Every 8 hours 05/27/20 2043 05/27/20 2109   05/27/20 1900  cefTRIAXone (ROCEPHIN) 2 g in sodium chloride 0.9 % 100 mL IVPB  Status:  Discontinued        2 g 200 mL/hr over 30 Minutes Intravenous  Once 05/27/20 1850 05/27/20 1853   05/27/20 1900  piperacillin-tazobactam (ZOSYN) IVPB 3.375 g        3.375 g 100 mL/hr over 30 Minutes Intravenous  Once 05/27/20 1854 05/27/20 2139       Assessment/Plan HTN HLD Depression Right clavicle fracture  Hypokalemia - Her potassium was low at 2.4 this AM. It appears TRH is already replacing this.  - Per TRH -   GangrenousCalculousCholecystitis S/p Laparoscopic Cholecystectomy with PDS Endoloop around cystic duct stump - Dr. Andrey Campanile - 05/28/2020 - POD #3 - HIDA negative. LFTs normalized.  - If patient tolerates breakfast can be discharged home with [facility] from our standpoint. I discussed post-op instructions and have arranged follow up. Will send pain medication to her pharmacy. I will reach out to [facility] to let them know.   FEN -Reg, add bowel regimen  VTE -SCDs, Lovenox ID -Zosyn 12/4 - 12/6 Foley - None. Voiding Follow-Up - DOW   LOS: 4 days    Jacinto Halim , [facility] 05/31/2020, 8:16 AM Please see Amion for pager number during day hours 7:00am-4:30pm

## 2020-05-31 NOTE — Progress Notes (Addendum)
[name] to be D/C'd  per MD order. Discussed with the patient and all questions fully answered.  VSS, Skin clean, dry and intact without evidence of skin break down, no evidence of skin tears noted.  IV catheter discontinued intact. Site without signs and symptoms of complications. Dressing and pressure applied.  An After Visit Summary was printed and given to the patient.  D/c education completed with patient/family including follow up instructions, medication list, d/c activities limitations if indicated, with other d/c instructions as indicated by MD - patient able to verbalize understanding, all questions fully answered.   Patient instructed to return to ED, call 911, or call MD for any changes in condition.   Patient to be escorted via WC, and D/C home via private auto.  Charge Nurse, Romeo Apple, RN discharged patient.

## 2020-05-31 NOTE — Progress Notes (Signed)
Physical Therapy Treatment Patient Details Name: Phyllis [name] MRN: [id number] DOB: 19-Aug-1940 Today's Date: 05/31/2020    History of Present Illness Phyllis [name] is a 79 y.o. female with medical history significant for prior appendectomy, LA grade A esophagitis, gastric ulcer, chronic depression/anxiety, insomnia, prior CVA, essential hypertension, hyperlipidemia, GERD, osteoporosis, who presented to [facility] ED with complaints of sudden onset right-sided abdominal pain, associated with nausea, vomiting and poor oral intake of 1 day duration. now s/p lap chole; of note, pt fell a few days prior to this admission, sustaining a R clavicle fx; pt reports she is to minimize use of her RUE, and not hold anything "heavier than a coffee cup"    PT Comments    Pt supine on arrival, agreeable to therapy session and with good participation and tolerance for mobility. Pt making progress with ambulatory distances, able to walk more than household distances using cane and min guard at most for stability, but with slightly unsteady gait pattern. Pt performed static standing balance challenges and unable to perform full tandem stance without external min/mod assist for balance, would definitely benefit from further PT at home/OP setting due to balance deficits which increase her fall risk, especially in setting of injured RUE. Pt continues to benefit from PT services to progress toward functional mobility goals. Anticipate pt safe to DC home with family supervision/assist for all mobility.   Follow Up Recommendations  Home health PT;Outpatient PT;Supervision - Intermittent (HHPT progress to OPPT depending on transportation available)     Equipment Recommendations  Cane    Recommendations for Other Services       Precautions / Restrictions Precautions Precautions: Fall;Other (comment) Precaution Comments: RUE sling for comfort; "hold no more than a cofffee cup" Required Braces or Orthoses:  Sling Restrictions Weight Bearing Restrictions: No Other Position/Activity Restrictions: minimize use of RUE    Mobility  Bed Mobility Overal bed mobility: Modified Independent Bed Mobility: Supine to Sit     Supine to sit: Modified independent (Device/Increase time)     General bed mobility comments: pt did not use RUE  Transfers Overall transfer level: Needs assistance Equipment used: Straight cane Transfers: Sit to/from Stand Sit to Stand: Supervision         General transfer comment: no physical assist required  Ambulation/Gait Ambulation/Gait assistance: Min guard Gait Distance (Feet): 300 Feet Assistive device: Straight cane Gait Pattern/deviations: Step-through pattern;Drifts right/left Gait velocity: <0.4 m/s   General Gait Details: pt tends to inconsistent foot placement/step width, tends to drift to L when walking on L side of hallway and to R when walking on R side; min guard for safety and cues for middle of hallway given occasionally   Stairs             Wheelchair Mobility    Modified Rankin (Stroke Patients Only)       Balance Overall balance assessment: Needs assistance Sitting-balance support: No upper extremity supported Sitting balance-Leahy Scale: Good     Standing balance support: Single extremity supported Standing balance-Leahy Scale: Fair       Tandem Stance - Right Leg: 0 (unable to hold position without external assist)         High Level Balance Comments: pt performed narrow BOS and semi-tandem stances EO >10 sec without LOB, but increased postural sway notes; unable to perform full tandem stance without external support            Cognition Arousal/Alertness: Awake/alert Behavior During Therapy: WFL for tasks assessed/performed Overall  Cognitive Status: Within Functional Limits for tasks assessed Area of Impairment: Safety/judgement                         Safety/Judgement: Decreased awareness of  deficits     General Comments: pt needs 2-3 cues during session for not pushing with RUE despite sling donned; generally decreased awareness of safety due to poor balance      Exercises Other Exercises Other Exercises: seated BLE AROM: ankle pumps, hip flexion, LAQ, hip adduction squeezes 1x10 reps ea    General Comments General comments (skin integrity, edema, etc.): mild increased WOB with exertion but SpO2 WNL; pt pulling up to 1250 on IS, encouraged 10x/hour use      Pertinent Vitals/Pain Pain Assessment: No/denies pain Pain Intervention(s): Monitored during session    Home Living                      Prior Function            PT Goals (current goals can now be found in the care plan section) Acute Rehab PT Goals Patient Stated Goal: healing after surgery PT Goal Formulation: With patient Time For Goal Achievement: 06/12/20 Potential to Achieve Goals: Good Progress towards PT goals: Progressing toward goals    Frequency    Min 3X/week      PT Plan Current plan remains appropriate    Co-evaluation              AM-PAC PT "6 Clicks" Mobility   Outcome Measure  Help needed turning from your back to your side while in a flat bed without using bedrails?: None Help needed moving from lying on your back to sitting on the side of a flat bed without using bedrails?: None Help needed moving to and from a bed to a chair (including a wheelchair)?: A Little Help needed standing up from a chair using your arms (e.g., wheelchair or bedside chair)?: A Little Help needed to walk in hospital room?: A Little Help needed climbing 3-5 steps with a railing? : A Little 6 Click Score: 20    End of Session Equipment Utilized During Treatment: Gait belt Activity Tolerance: Patient tolerated treatment well Patient left: in chair;with call bell/phone within reach;with nursing/sitter in room Nurse Communication: Mobility status PT Visit Diagnosis: Unsteadiness on feet  (R26.81);Other abnormalities of gait and mobility (R26.89)     Time: 8453-6468 PT Time Calculation (min) (ACUTE ONLY): 21 min  Charges:  $Gait Training: 8-22 mins                     Taylon Coole P., PTA Acute Rehabilitation Services Pager: [phone] Office: [phone]   Angus Palms 05/31/2020, 1:35 PM

## 2020-05-31 NOTE — Progress Notes (Addendum)
MEDICATION RELATED CONSULT NOTE - INITIAL   Pharmacy Consult for Phos repletion Indication: Hypophosphatemia   Allergies  Allergen Reactions  . Azithromycin     Developed a rash right after she quit taking it   Intake/Output from previous day: 12/07 0701 - 12/08 0700 In: 480 [P.O.:480] Out: 250 [Urine:250] Intake/Output from this shift: No intake/output data recorded.  Labs: Recent Labs    05/29/20 0350 05/30/20 0452 05/30/20 0808 05/31/20 0050  WBC 10.7*  --  10.3 8.6  HGB 11.3*  --  11.2* 11.7*  HCT 32.1*  --  33.2* 34.7*  PLT 208  --  235 253  CREATININE 0.63 0.57  --  0.51  MG 2.0  --   --  1.9  PHOS 2.8  --   --  2.1*  ALBUMIN 2.5* 2.7*  --  2.5*  PROT 5.8* 6.0*  --  5.8*  AST 49* 61*  --  28  ALT 51* 43  --  37  ALKPHOS 60 84  --  95  BILITOT 0.7 1.5*  --  0.5   Estimated Creatinine Clearance: 48.4 mL/min (by C-G formula based on SCr of 0.51 mg/dL).  Assessment: 79 yo W with cholecystitis and K 2.4, phos 2.1 on BMP drawn at 1am. Low levels likely due to dilution, NV and poor PO intake x5 days. Pharmacy consulted for phos repletion with planned repeat BMP in the afternoon and possible discharge later today.   K 30 mEq x1 given and IV 40 mEq ordered. Appropriate to give KPhos (NaPhos on Sport and exercise psychologist).   ADDENDUM 11:30: Kphos not hung as of 11:30 due to infiltrate and lost PIV from KCl. RN to hang as soon as able. PM BMP moved to 1800   ADDENDUM 12:45: Discussed with MD, really want to discharge patient today and Kphos still not hung due to too much K running at the same time without tele. New plan is to hold IV phos, discharge with PO Kphos and repeat BMP for K monitoring in a few hours.   Goal of Therapy:  Normal K and Phos   Plan:  Give PO Kphos 500mg  TID x 7 doses F/u 1400 BMP   , PharmD, BCPS, BCCP Clinical Pharmacist  Please check AMION for all [facility] Pharmacy phone numbers After 10:00 PM, call Main Pharmacy [phone]

## 2020-05-31 NOTE — Discharge Instructions (Signed)
[facility] ° °Please arrive at least 30 min before your appointment to complete your check in paperwork.  If you are unable to arrive 30 min prior to your appointment time we may have to cancel or reschedule you. °LAPAROSCOPIC SURGERY: POST OP INSTRUCTIONS °Always review your discharge instruction sheet given to you by the facility where your surgery was performed. °IF YOU HAVE DISABILITY OR FAMILY LEAVE FORMS, YOU MUST BRING THEM TO THE OFFICE FOR PROCESSING.   °DO NOT GIVE THEM TO YOUR DOCTOR. ° °PAIN CONTROL ° °1. First take acetaminophen (Tylenol) AND/or ibuprofen (Advil) to control your pain after surgery.  Follow directions on package.  Taking acetaminophen (Tylenol) and/or ibuprofen (Advil) regularly after surgery will help to control your pain and lower the amount of prescription pain medication you may need.  You should not take more than 4,000 mg (4 grams) of acetaminophen (Tylenol) in 24 hours.  You should not take ibuprofen (Advil), aleve, motrin, naprosyn or other NSAIDS if you have a history of stomach ulcers or chronic kidney disease.  °2. A prescription for pain medication may be given to you upon discharge.  Take your pain medication as prescribed, if you still have uncontrolled pain after taking acetaminophen (Tylenol) or ibuprofen (Advil). °3. Use ice packs to help control pain. °4. If you need a refill on your pain medication, please contact your pharmacy.  They will contact our office to request authorization. Prescriptions will not be filled after 5pm or on week-ends. ° °HOME MEDICATIONS °5. Take your usually prescribed medications unless otherwise directed. ° °DIET °6. You should follow a light diet the first few days after arrival home.  Be sure to include lots of fluids daily. Avoid fatty, fried foods.  ° °CONSTIPATION °7. It is common to experience some constipation after surgery and if you are taking pain medication.  Increasing fluid intake and taking a stool  softener (such as Colace) will usually help or prevent this problem from occurring.  A mild laxative (Milk of Magnesia or Miralax) should be taken according to package instructions if there are no bowel movements after 48 hours. ° °WOUND/INCISION CARE °8. Most patients will experience some swelling and bruising in the area of the incisions.  Ice packs will help.  Swelling and bruising can take several days to resolve.  °9. Unless discharge instructions indicate otherwise, follow guidelines below  °a. STERI-STRIPS - you may remove your outer bandages 48 hours after surgery, and you may shower at that time.  You have steri-strips (small skin tapes) in place directly over the incision.  These strips should be left on the skin for 7-10 days.   °b. DERMABOND/SKIN GLUE - you may shower in 24 hours.  The glue will flake off over the next 2-3 weeks. °10. Any sutures or staples will be removed at the office during your follow-up visit. ° °ACTIVITIES °11. You may resume regular (light) daily activities beginning the next day--such as daily self-care, walking, climbing stairs--gradually increasing activities as tolerated.  You may have sexual intercourse when it is comfortable.  Refrain from any heavy lifting or straining until approved by your doctor. °a. You may drive when you are no longer taking prescription pain medication, you can comfortably wear a seatbelt, and you can safely maneuver your car and apply brakes. ° °FOLLOW-UP °12. You should see your doctor in the office for a follow-up appointment approximately 2-3 weeks after your surgery.  You should have been given your post-op/follow-up appointment when   your surgery was scheduled.  If you did not receive a post-op/follow-up appointment, make sure that you call for this appointment within a day or two after you arrive home to insure a convenient appointment time. ° ° °WHEN TO CALL YOUR DOCTOR: °1. Fever over 101.0 °2. Inability to urinate °3. Continued bleeding from  incision. °4. Increased pain, redness, or drainage from the incision. °5. Increasing abdominal pain ° °The clinic staff is available to answer your questions during regular business hours.  Please don’t hesitate to call and ask to speak to one of the nurses for clinical concerns.  If you have a medical emergency, go to the nearest emergency room or call 911.  A surgeon from [facility] is always on call at the hospital. °[address], [address], [place], [place]  [address] ? [address] [address], [place], [place]   [address] °[phone] ? [phone] ? FAX [phone] °••••••••• ° ° °Managing Your Pain After Surgery Without Opioids ° ° ° °Thank you for participating in our program to help patients manage their pain after surgery without opioids. This is part of our effort to provide you with the best care possible, without exposing you or your family to the risk that opioids pose. ° °What pain can I expect after surgery? °You can expect to have some pain after surgery. This is normal. The pain is typically worse the day after surgery, and quickly begins to get better. °Many studies have found that many patients are able to manage their pain after surgery with Over-the-Counter (OTC) medications such as Tylenol and Motrin. If you have a condition that does not allow you to take Tylenol or Motrin, notify your surgical team. ° °How will I manage my pain? °The best strategy for controlling your pain after surgery is around the clock pain control with Tylenol (acetaminophen) and Motrin (ibuprofen or Advil). Alternating these medications with each other allows you to maximize your pain control. In addition to Tylenol and Motrin, you can use heating pads or ice packs on your incisions to help reduce your pain. ° °How will I alternate your regular strength over-the-counter pain medication? °You will take a dose of pain medication every three hours. °; Start by taking 650 mg of Tylenol (2 pills of 325  mg) °; 3 hours later take 600 mg of Motrin (3 pills of 200 mg) °; 3 hours after taking the Motrin take 650 mg of Tylenol °; 3 hours after that take 600 mg of Motrin. ° ° °- 1 - ° °See example - if your first dose of Tylenol is at 12:00 PM ° ° °12:00 PM Tylenol 650 mg (2 pills of 325 mg)  °3:00 PM Motrin 600 mg (3 pills of 200 mg)  °6:00 PM Tylenol 650 mg (2 pills of 325 mg)  °9:00 PM Motrin 600 mg (3 pills of 200 mg)  °Continue alternating every 3 hours  ° °We recommend that you follow this schedule around-the-clock for at least 3 days after surgery, or until you feel that it is no longer needed. Use the table on the last page of this handout to keep track of the medications you are taking. °Important: °Do not take more than 3000mg of Tylenol or 3200mg of Motrin in a 24-hour period. °Do not take ibuprofen/Motrin if you have a history of bleeding stomach ulcers, severe kidney disease, &/or actively taking a blood thinner ° °What if I still have pain? °If you have pain that is not controlled   with the over-the-counter pain medications (Tylenol and Motrin or Advil) you might have what we call breakthrough pain. You will receive a prescription for a small amount of an opioid pain medication such as Oxycodone, Tramadol, or Tylenol with Codeine. Use these opioid pills in the first 24 hours after surgery if you have breakthrough pain. Do not take more than 1 pill every 4-6 hours.  If you still have uncontrolled pain after using all opioid pills, don't hesitate to call our staff using the number provided. We will help make sure you are managing your pain in the best way possible, and if necessary, we can provide a prescription for additional pain medication.   Day 1    Time  Name of Medication Number of pills taken  Amount of Acetaminophen  Pain Level   Comments  AM PM       AM PM       AM PM       AM PM       AM PM       AM PM       AM PM       AM PM       Total Daily amount of Acetaminophen Do not  take more than  3,000 mg per day      Day 2    Time  Name of Medication Number of pills taken  Amount of Acetaminophen  Pain Level   Comments  AM PM       AM PM       AM PM       AM PM       AM PM       AM PM       AM PM       AM PM       Total Daily amount of Acetaminophen Do not take more than  3,000 mg per day      Day 3    Time  Name of Medication Number of pills taken  Amount of Acetaminophen  Pain Level   Comments  AM PM       AM PM       AM PM       AM PM          AM PM       AM PM       AM PM       AM PM       Total Daily amount of Acetaminophen Do not take more than  3,000 mg per day      Day 4    Time  Name of Medication Number of pills taken  Amount of Acetaminophen  Pain Level   Comments  AM PM       AM PM       AM PM       AM PM       AM PM       AM PM       AM PM       AM PM       Total Daily amount of Acetaminophen Do not take more than  3,000 mg per day      Day 5    Time  Name of Medication Number of pills taken  Amount of Acetaminophen  Pain Level   Comments  AM PM       AM PM       AM PM  AM PM       AM PM       AM PM       AM PM       AM PM       Total Daily amount of Acetaminophen Do not take more than  3,000 mg per day       Day 6    Time  Name of Medication Number of pills taken  Amount of Acetaminophen  Pain Level  Comments  AM PM       AM PM       AM PM       AM PM       AM PM       AM PM       AM PM       AM PM       Total Daily amount of Acetaminophen Do not take more than  3,000 mg per day      Day 7    Time  Name of Medication Number of pills taken  Amount of Acetaminophen  Pain Level   Comments  AM PM       AM PM       AM PM       AM PM       AM PM       AM PM       AM PM       AM PM       Total Daily amount of Acetaminophen Do not take more than  3,000 mg per day        For additional information about how and where to safely dispose of unused  opioid medications - [email or web address]  Disclaimer: This document contains information and/or instructional materials adapted from [facility] for the typical patient with your condition. It does not replace medical advice from your health care provider because your experience may differ from that of the typical patient. Talk to your health care provider if you have any questions about this document, your condition or your treatment plan. Adapted from [facility]    Additional discharge instructions  Please get your medications reviewed and adjusted by your Primary MD.  Please request your Primary MD to go over all Hospital Tests and Procedure/Radiological results at the follow up, please get all Hospital records sent to your Prim MD by signing hospital release before you go home.  If you had Pneumonia of Lung problems at the Hospital: Please get a 2 view Chest X ray done in 6-8 weeks after hospital discharge or sooner if instructed by your Primary MD.  If you have Congestive Heart Failure: Please call your Cardiologist or Primary MD anytime you have any of the following symptoms:  1) 3 pound weight gain in 24 hours or 5 pounds in 1 week  2) shortness of breath, with or without a dry hacking cough  3) swelling in the hands, feet or stomach  4) if you have to sleep on extra pillows at night in order to breathe  Follow cardiac low salt diet and 1.5 lit/day fluid restriction.  If you have diabetes Accuchecks 4 times/day, Once in AM empty stomach and then before each meal. Log in all results and show them to your primary doctor at your next visit. If any glucose reading is under 80 or above 300 call your primary MD immediately.  If you have Seizure/Convulsions/Epilepsy: Please do not drive, operate heavy machinery, participate in activities  at heights or participate in high speed sports until you have seen by Primary MD or a Neurologist and advised to do so  again.  If you had Gastrointestinal Bleeding: Please ask your Primary MD to check a complete blood count within one week of discharge or at your next visit. Your endoscopic/colonoscopic biopsies that are pending at the time of discharge, will also need to followed by your Primary MD.  Get Medicines reviewed and adjusted. Please take all your medications with you for your next visit with your Primary MD  Please request your Primary MD to go over all hospital tests and procedure/radiological results at the follow up, please ask your Primary MD to get all Hospital records sent to his/her office.  If you experience worsening of your admission symptoms, develop shortness of breath, life threatening emergency, suicidal or homicidal thoughts you must seek medical attention immediately by calling 911 or calling your MD immediately  if symptoms less severe.  You must read complete instructions/literature along with all the possible adverse reactions/side effects for all the Medicines you take and that have been prescribed to you. Take any new Medicines after you have completely understood and accpet all the possible adverse reactions/side effects.   Do not drive or operate heavy machinery when taking Pain medications.   Do not take more than prescribed Pain, Sleep and Anxiety Medications  Special Instructions: If you have smoked or chewed Tobacco  in the last 2 yrs please stop smoking, stop any regular Alcohol  and or any Recreational drug use.  Wear Seat belts while driving.  Please note You were cared for by a hospitalist during your hospital stay. If you have any questions about your discharge medications or the care you received while you were in the hospital after you are discharged, you can call the unit and asked to speak with the hospitalist on call if the hospitalist that took care of you is not available. Once you are discharged, your primary care physician will handle any further medical  issues. Please note that NO REFILLS for any discharge medications will be authorized once you are discharged, as it is imperative that you return to your primary care physician (or establish a relationship with a primary care physician if you do not have one) for your aftercare needs so that they can reassess your need for medications and monitor your lab values.  You can reach the hospitalist office at phone [phone] or fax [phone]   If you do not have a primary care physician, you can call [phone] for a physician referral.

## 2020-05-31 NOTE — TOC Initial Note (Signed)
Transition of Care [facility]) - Initial/Assessment Note    Patient Details  Name: Phyllis [name] MRN: [id number] Date of Birth: 1941-01-10  Transition of Care [facility]) CM/SW Contact:    Kingsley Plan, RN Phone Number: 05/31/2020, 1:18 PM  Clinical Narrative:                  Patient from home alone but has family close who can assist. Discussed PT recommendation HHPT progress to OP PT. Patient prefers OP PT , she does not want HHPT.  Offered choice , patient selected OP PT at [facility], referral entered. NCM confirmed patient's number, they will call and discuss insurance coverage schedule etc. Patient has transportation. Expected Discharge Plan: Home/Self Care Barriers to Discharge: No Barriers Identified   Patient Goals and CMS Choice Patient states their goals for this hospitalization and ongoing recovery are:: to return home CMS Medicare.gov Compare Post Acute Care list provided to:: Patient Choice offered to / list presented to : Patient  Expected Discharge Plan and Services Expected Discharge Plan: Home/Self Care   Discharge Planning Services: CM Consult   Living arrangements for the past 2 months: Single Family Home                 DME Arranged: N/A DME Agency: NA       HH Arranged: NA          Prior Living Arrangements/Services Living arrangements for the past 2 months: Single Family Home Lives with:: Self Patient language and need for interpreter reviewed:: Yes Do you feel safe going back to the place where you live?: Yes      Need for Family Participation in Patient Care: Yes (Comment) Care giver support system in place?: Yes (comment)   Criminal Activity/Legal Involvement Pertinent to Current Situation/Hospitalization: No - Comment as needed  Activities of Daily Living      Permission Sought/Granted   Permission granted to share information with : No              Emotional Assessment Appearance:: Appears stated  age Attitude/Demeanor/Rapport: Engaged Affect (typically observed): Accepting Orientation: : Oriented to Self, Oriented to Place, Oriented to  Time, Oriented to Situation Alcohol / Substance Use: Not Applicable Psych Involvement: No (comment)  Admission diagnosis:  Acute cholecystitis [K81.0] Sepsis, due to unspecified organism, unspecified whether acute organ dysfunction present [facility]) [A41.9] Patient Active Problem List   Diagnosis Date Noted  . Acute cholecystitis 05/27/2020  . Essential hypertension, benign 03/08/2018  . Reflux esophagitis 03/08/2018  . Transient hypotension 03/08/2018  . Acute upper GI bleeding 03/08/2018  . Symptomatic anemia 03/08/2018  . Near syncope   . Attention deficit disorder 01/06/2018  . Cognitive change 06/11/2017  . 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   PCP:  [name], MD Pharmacy:   [facility] [phone] - [place], [place] - [address] AT [facility] [address] [place] [address] Phone: [phone] Fax: [phone]  [facility] SERVICE) [facility] - [place], [place] - [address] [place] [address] Phone: [phone] Fax: [phone]     Social Determinants of Health (SDOH) Interventions    Readmission Risk Interventions No flowsheet data found.

## 2020-06-01 LAB — CULTURE, BLOOD (ROUTINE X 2)
Culture: NO GROWTH
Culture: NO GROWTH

## 2020-06-15 ENCOUNTER — Ambulatory Visit: Payer: Medicare Other

## 2020-06-15 ENCOUNTER — Other Ambulatory Visit: Payer: Medicare Other

## 2020-09-08 ENCOUNTER — Other Ambulatory Visit: Payer: Self-pay

## 2020-09-08 ENCOUNTER — Ambulatory Visit
Admission: RE | Admit: 2020-09-08 | Discharge: 2020-09-08 | Disposition: A | Discharge status: Home / Self Care | Payer: Medicare Other | Source: Ambulatory Visit | Attending: Family Medicine | Admitting: Family Medicine

## 2020-09-08 DIAGNOSIS — M810 Age-related osteoporosis without current pathological fracture: Secondary | ICD-10-CM

## 2020-09-08 DIAGNOSIS — Z1231 Encounter for screening mammogram for malignant neoplasm of breast: Secondary | ICD-10-CM

## 2020-09-12 ENCOUNTER — Other Ambulatory Visit: Payer: Self-pay | Admitting: Family Medicine

## 2020-09-12 DIAGNOSIS — R928 Other abnormal and inconclusive findings on diagnostic imaging of breast: Secondary | ICD-10-CM

## 2020-10-03 ENCOUNTER — Other Ambulatory Visit: Payer: Medicare Other

## 2020-10-09 ENCOUNTER — Other Ambulatory Visit: Payer: Self-pay

## 2020-10-09 ENCOUNTER — Ambulatory Visit
Admission: RE | Admit: 2020-10-09 | Discharge: 2020-10-09 | Disposition: A | Discharge status: Home / Self Care | Payer: Medicare Other | Source: Ambulatory Visit | Attending: Family Medicine | Admitting: Family Medicine

## 2020-10-09 ENCOUNTER — Ambulatory Visit: Payer: Medicare Other

## 2020-10-09 DIAGNOSIS — R928 Other abnormal and inconclusive findings on diagnostic imaging of breast: Secondary | ICD-10-CM

## 2021-11-30 IMAGING — MG MM DIGITAL DIAGNOSTIC UNILAT*R* W/ TOMO W/ CAD
8 series · 8 of 24 positions shown · non-contrast
Comparison: Previous exam(s).

CLINICAL DATA: 80-year-old female presenting as a recall from
screening for possible right breast distortion.

EXAM:
DIGITAL DIAGNOSTIC UNILATERAL RIGHT MAMMOGRAM WITH TOMOSYNTHESIS AND
CAD
TECHNIQUE: Right digital diagnostic mammography and breast tomosynthesis was
performed. The images were evaluated with computer-aided detection.

[R MLO synth-2D (1 of 2)]
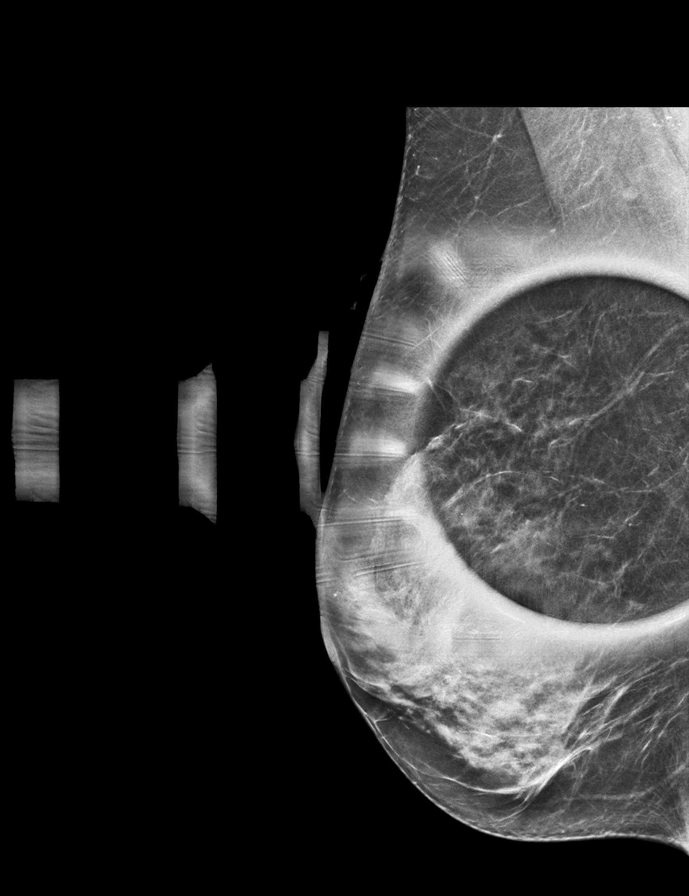

[R ML synth-2D]
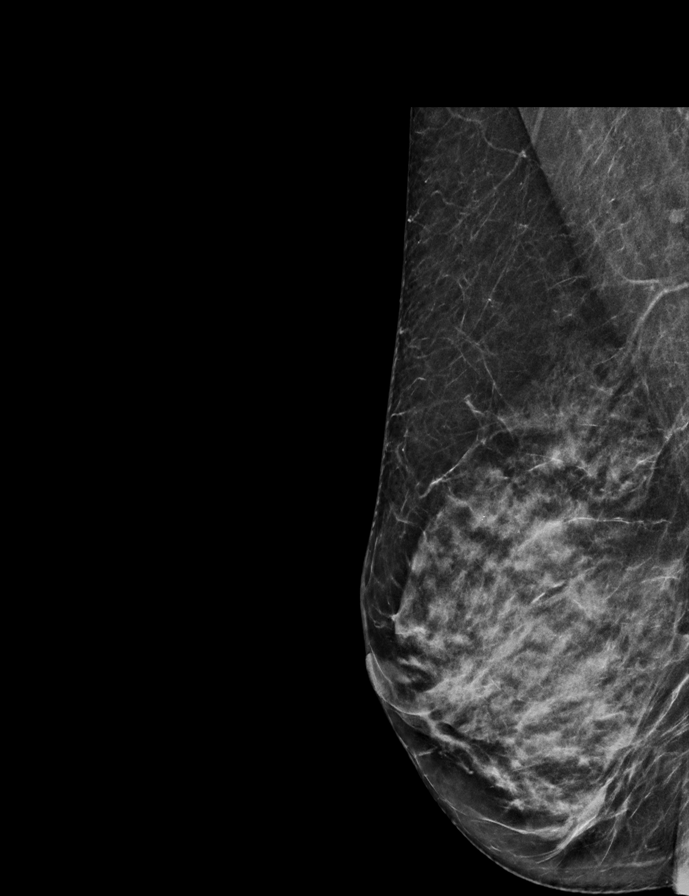

[R MLO synth-2D (2 of 2)]
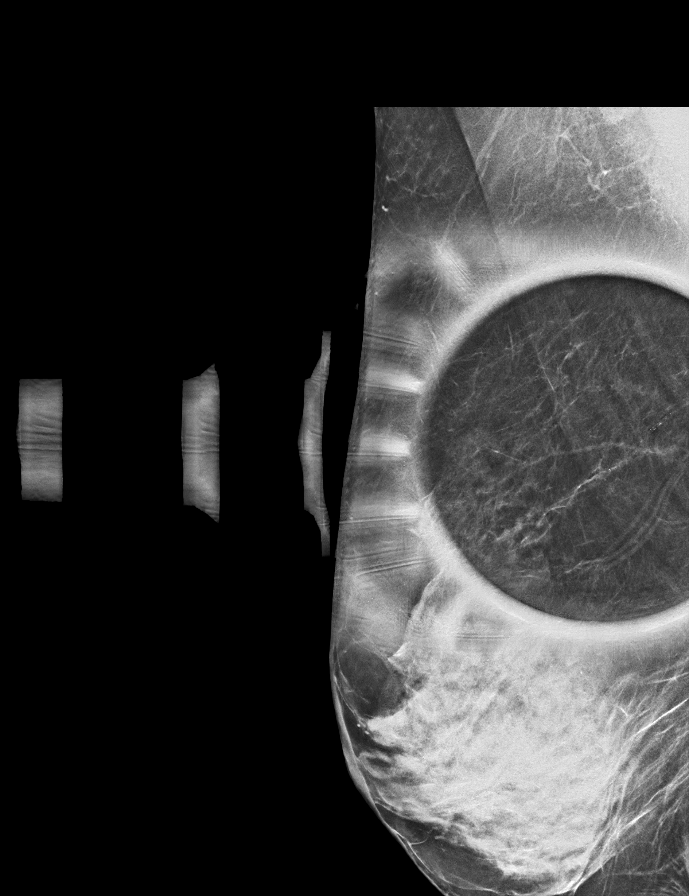

[R CC synth-2D]
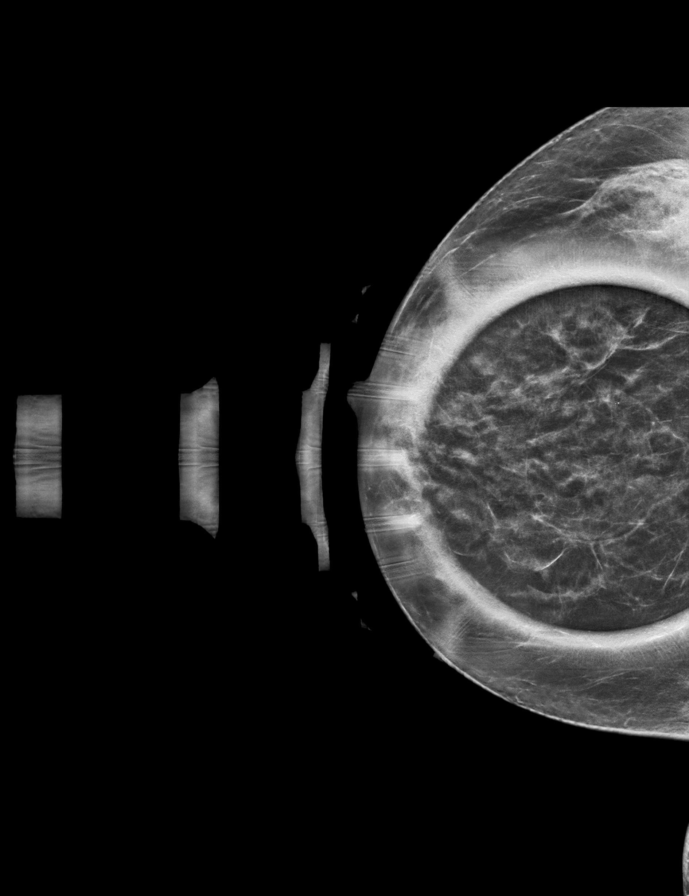

[R MLO tomo (1 of 2) · tomo slice 26/51.0]
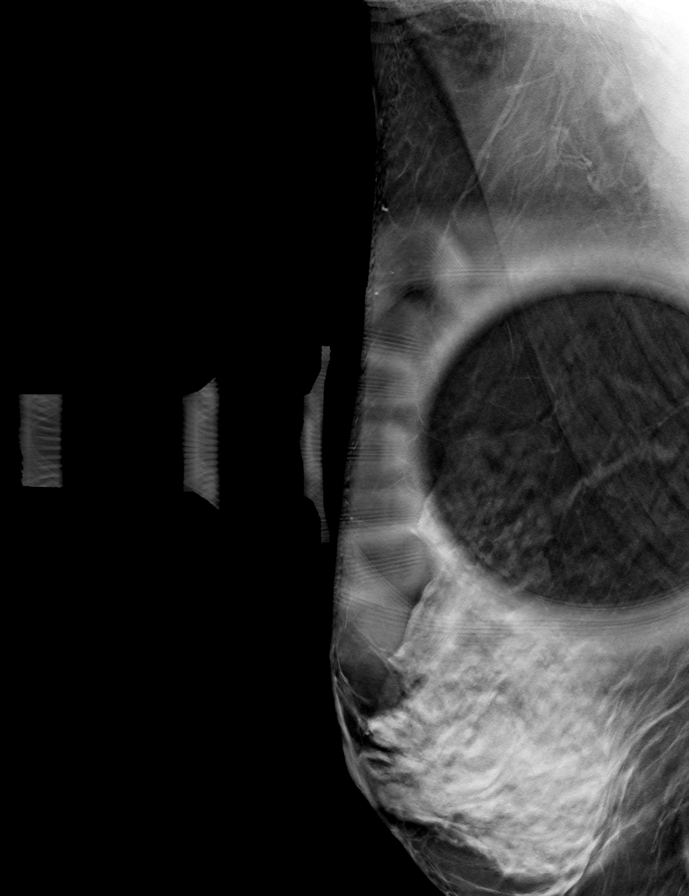

[R MLO tomo (2 of 2) · tomo slice 27/53.0]
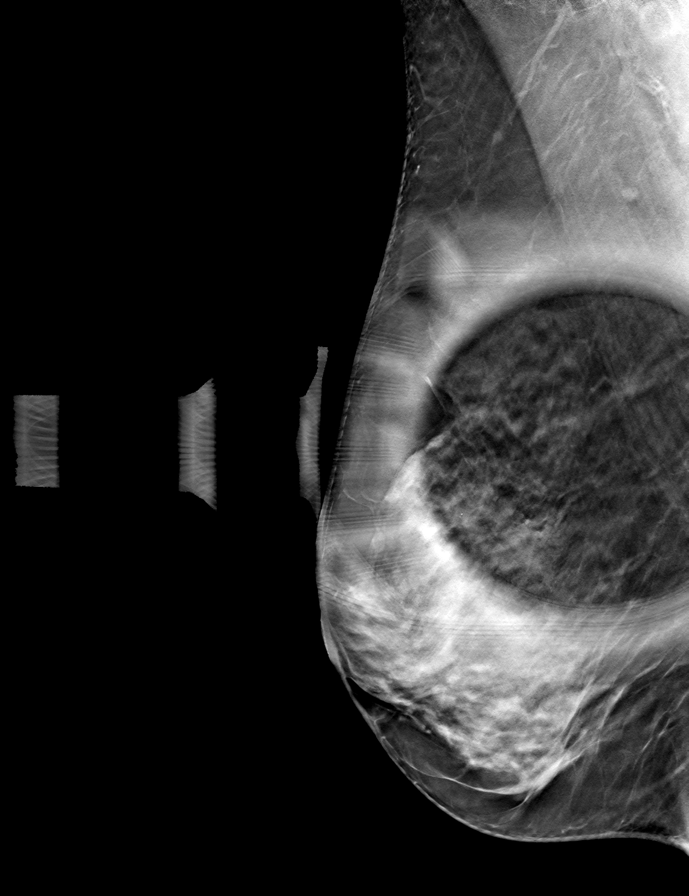

[R ML tomo · tomo slice 28/55.0]
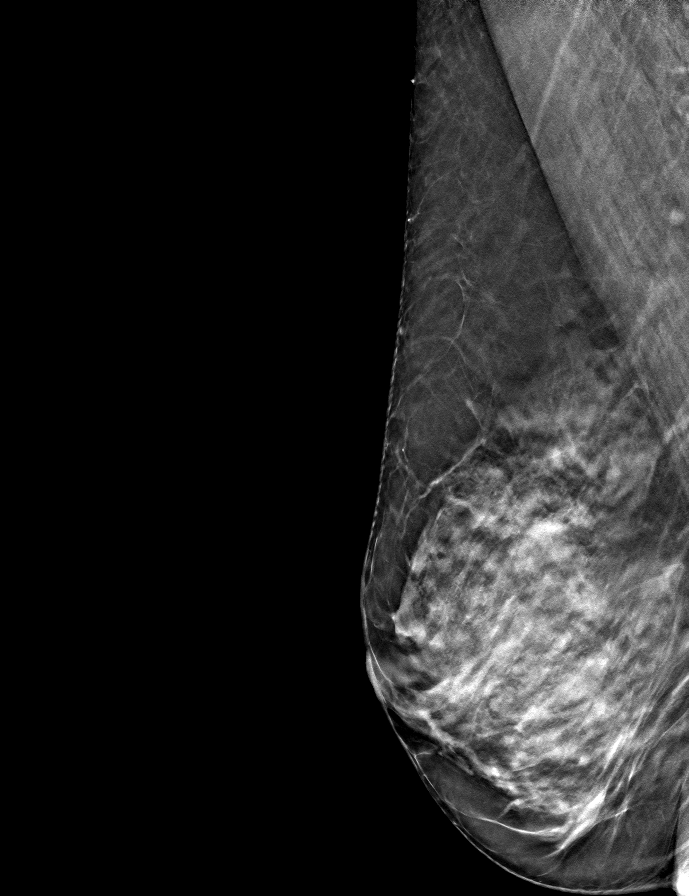

[R CC tomo · tomo slice 27/52.0]
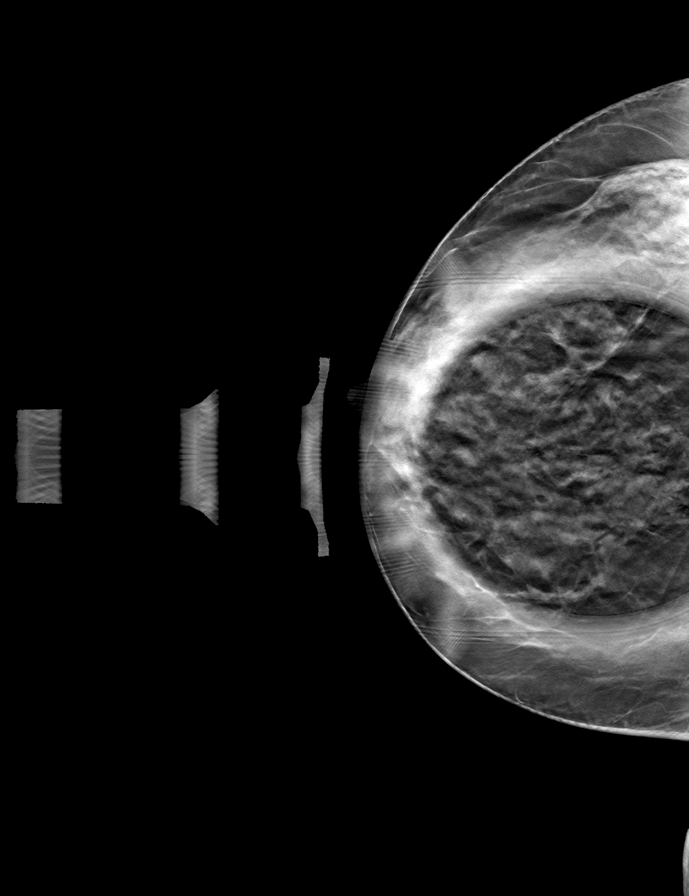

[8 of 24 positions shown; findings below may reference images not displayed]

ACR Breast Density Category c: The breast tissue is heterogeneously
dense, which may obscure small masses.
FINDINGS: Spot compression tomosynthesis and full field mL tomosynthesis views
were performed for a questioned area of distortion in the superior
central right breast posterior depth. On the additional imaging the
tissue in this area disperses without persistent asymmetry, mass, or
true distortion. The finding on screening mammogram is consistent
with normal overlapping fibroglandular tissue.
IMPRESSION: Resolution of the questioned distortion seen on screening mammogram
in the superior central right breast. No mammographic evidence of
malignancy.

RECOMMENDATION:
Screening mammogram in one year.(Code:[id number])

I have discussed the findings and recommendations with the patient.
If applicable, a reminder letter will be sent to the patient
regarding the next appointment.

BI-RADS CATEGORY  1: Negative.

## 2022-07-16 NOTE — Progress Notes (Signed)
[facility] Note  07/22/2022     CHIEF COMPLAINT Patient presents for Retina Evaluation   HISTORY OF PRESENT ILLNESS: Phyllis [name] is a 82 y.o. female who presents to the clinic today for:   HPI     Retina Evaluation   In left eye.  This started 1 week ago.  Duration of 1 week.  I, the attending physician,  performed the HPI with the patient and updated documentation appropriately.        Comments   Retina eval per Dr. Valetta Close BRVO OS pt states  no vision changes noticed she denies flashes or floaters pt was in the hospital Saturday for elevated BP they kept her over night pt states it was 200/110      Last edited by Bernarda Caffey, MD on 07/23/2022 12:29 AM.    Pt is here on the referral of Dr. Valetta Close, she saw him last week for a routine eye exam, pt went to the ED on Saturday bc she was "dizzy and weak", her friend came over to her house and they checked her BP which was 200/110, she had a CT scan which showed some blockage in her carotid arteries, pt stated the drs in the ED did not change her BP meds, but she had not taken them on Saturday prior to the ED visit, pt denies any changes in vision  Referring physician: Jola Schmidt, MD [place],  [place] [address]  HISTORICAL INFORMATION:   Selected notes from the MEDICAL RECORD NUMBER Referred by Dr. Valetta Close for BRVO OS LEE:  Ocular Hx- PMH-    CURRENT MEDICATIONS: No current outpatient medications on file. (Ophthalmic Drugs)   No current facility-administered medications for this visit. (Ophthalmic Drugs)   Current Outpatient Medications (Other)  Medication Sig   acetaminophen (TYLENOL) 325 MG tablet Take 2 tablets (650 mg total) by mouth every 6 (six) hours as needed for mild pain, moderate pain or fever.   alendronate (FOSAMAX) 70 MG tablet Take 70 mg by mouth every Saturday.   Ascorbic Acid (VITAMIN C) 100 MG tablet Take 100 mg by mouth daily.    butalbital-acetaminophen-caffeine (FIORICET) 50-325-40 MG tablet Take 1 tablet by mouth 2 (two) times daily as needed for headache.   Cholecalciferol (VITAMIN D3) 25 MCG (1000 UT) CAPS Take 1 capsule by mouth daily.   Ferrous Sulfate Dried (HIGH POTENCY IRON) 65 MG TABS Take 1 tablet by mouth daily.   hydrOXYzine (ATARAX/VISTARIL) 10 MG tablet Take 5 mg by mouth at bedtime as needed (For sleep).    losartan (COZAAR) 50 MG tablet Take 50 mg by mouth daily.   Magnesium 250 MG TABS Take 1 tablet (250 mg total) by mouth daily.   Melatonin 10 MG TABS Take 1 tablet by mouth at bedtime.   Multiple Vitamin (MULTIVITAMIN) tablet Take 1 tablet by mouth daily.   oxyCODONE (OXY IR/ROXICODONE) 5 MG immediate release tablet Take 1 tablet (5 mg total) by mouth every 6 (six) hours as needed for breakthrough pain.   pantoprazole (PROTONIX) 40 MG tablet Take 1 tablet (40 mg total) by mouth 2 (two) times daily. (Patient taking differently: Take 40 mg by mouth daily. )   polyethylene glycol (MIRALAX) 17 g packet Take 17 g by mouth daily as needed for mild constipation.   pravastatin (PRAVACHOL) 40 MG tablet Take 40 mg by mouth daily.   venlafaxine XR (EFFEXOR-XR) 150 MG 24 hr capsule Take 150 mg by mouth daily  with breakfast.   VITAMIN B COMPLEX-C PO Take 1 tablet by mouth daily.   vitamin B-12 (CYANOCOBALAMIN) 1000 MCG tablet Take 1,000 mcg by mouth daily.   No current facility-administered medications for this visit. (Other)   REVIEW OF SYSTEMS: ROS   Positive for: Eyes, Psychiatric Last edited by Parthenia Ames, COT on 07/22/2022  1:10 PM.     ALLERGIES Allergies  Allergen Reactions   Azithromycin     Developed a rash right after she quit taking it   PAST MEDICAL HISTORY Past Medical History:  Diagnosis Date   Abdominal pain, unspecified site    Acute sinusitis, unspecified    Complication of anesthesia    Hard toe wake up from too much medicine   Contact dermatitis and other eczema due to  plants (except food)    Cough    Cramp of limb    Depression    Depressive disorder, not elsewhere classified    Essential hypertension, benign    Headache(784.0)    High cholesterol    Hypertension    Lumbago    Migraine    Observation for suspected cardiovascular disease    Osteoporosis, unspecified    Other and unspecified hyperlipidemia    Rash and other nonspecific skin eruption    Rectocele    Reflux esophagitis    Unspecified constipation    Past Surgical History:  Procedure Laterality Date   APPENDECTOMY     BREAST BIOPSY     Percutaneous needle core   CHOLECYSTECTOMY N/A 05/28/2020   Procedure: LAPAROSCOPIC CHOLECYSTECTOMY;  Surgeon: Greer Pickerel, MD;  Location: [facility];  Service: General;  Laterality: N/A;   ESOPHAGOGASTRODUODENOSCOPY (EGD) WITH PROPOFOL N/A 03/08/2018   Procedure: ESOPHAGOGASTRODUODENOSCOPY (EGD) WITH PROPOFOL;  Surgeon: Arta Silence, MD;  Location: [facility];  Service: Endoscopy;  Laterality: N/A;   FAMILY HISTORY Family History  Problem Relation Age of Onset   Hypertension Other    Coronary artery disease Other    Heart disease Mother    ADD / ADHD Son    SOCIAL HISTORY Social History   Tobacco Use   Smoking status: Former    Packs/day: 2.00    Years: 12.00    Total pack years: 24.00    Types: Cigarettes    Quit date: 06/24/1985    Years since quitting: 37.1   Smokeless tobacco: Never  Vaping Use   Vaping Use: Never used  Substance Use Topics   Alcohol use: No   Drug use: No       OPHTHALMIC EXAM:  Base Eye Exam     Visual Acuity (Snellen - Linear)       Right Left   Dist cc 20/30 -2 20/25 -2   Dist ph cc NI NI    Correction: Glasses         Tonometry (Tonopen, 1:15 PM)       Right Left   Pressure 18 14         Pupils       Pupils Dark Light Shape React APD   Right PERRL 4 3 Round Brisk None   Left PERRL 4 3 Round Brisk None         Visual Fields       Left Right    Full Full          Extraocular Movement       Right Left    Full, Ortho Full, Ortho         Neuro/Psych  Oriented x3: Yes   Mood/Affect: Normal         Dilation     Both eyes: 2.5% Phenylephrine @ 1:15 PM           Slit Lamp and Fundus Exam     Slit Lamp Exam       Right Left   Lids/Lashes Dermatochalasis - upper lid, mild MGD Dermatochalasis - upper lid, mild MGD   Conjunctiva/Sclera White and quiet White and quiet   Cornea trace PEE, trace, fine endo pigment Arcus, trace PEE, trace, fine endo pigment   Anterior Chamber deep and clear deep and clear   Iris Round and dilated Round and dilated   Lens PC IOL in good position with open PC PC IOL in good position with open PC   Anterior Vitreous Vitreous syneresis, Posterior vitreous detachment Vitreous syneresis, Posterior vitreous detachment, vitreous condensations, old VH settled inferiorly         Fundus Exam       Right Left   Disc mild Pallor, Sharp rim, +PPA mild Pallor, Sharp rim, inferior PPA   C/D Ratio 0.2 0.1   Macula Flat, Blunted foveal reflex, RPE mottling and clumping, No heme or edema Flat, Blunted foveal reflex, RPE mottling and clumping, early atrophy, inferior IRH   Vessels attenuated, Tortuous attenuated, Tortuous, vascular loops, +IRH along IT arcades   Periphery Attached Attached           Refraction     Wearing Rx       Sphere Cylinder Axis Add   Right -1.25 +2.50 101 +2.50   Left -1.00 +1.75 100 +2.50           IMAGING AND PROCEDURES  Imaging and Procedures for 07/22/2022  OCT, Retina - OU - Both Eyes       Right Eye Quality was good. Central Foveal Thickness: 301. Progression has no prior data. Findings include normal foveal contour, no IRF, no SRF (Mild ERM).   Left Eye Quality was good. Central Foveal Thickness: 288. Progression has no prior data. Findings include normal foveal contour, no IRF, no SRF, myopic contour.   Notes *Images captured and stored on drive  Diagnosis /  Impression:  NFP, no IRF/SRF OU  Clinical management:  See below  Abbreviations: NFP - Normal foveal profile. CME - cystoid macular edema. PED - pigment epithelial detachment. IRF - intraretinal fluid. SRF - subretinal fluid. EZ - ellipsoid zone. ERM - epiretinal membrane. ORA - outer retinal atrophy. ORT - outer retinal tubulation. SRHM - subretinal hyper-reflective material. IRHM - intraretinal hyper-reflective material            ASSESSMENT/PLAN:    ICD-10-CM   1. 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 Retinal hemorrhage of left eye  H35.62     3. Essential hypertension  I10     4. Hypertensive retinopathy of both eyes  H35.033     5. Pseudophakia, both eyes  Z96.1      1-4. BRVO with retinal hemorrhages OS  - pt reports recent ED visit (01.27.24) for elevated BP to >200 SBP, >100 DBP  - pt reports memory problems that likely contributed to noncompliance of BP meds - The natural history of retinal vein occlusion and macular edema and treatment options including observation, laser photocoagulation, and intravitreal antiVEGF injection with Avastin and Lucentis and Eylea and intravitreal injection of steroids with triamcinolone and Ozurdex and the  complications of these procedures including loss of vision, infection, cataract, glaucoma, and retinal detachment were discussed with patient. - Specifically discussed patient stabilization with anti-VEGF agents and increased potential for visual improvements. Also discussed need for frequent follow up and potentially multiple injections given the chronic nature of the disease process - BCVA 20/25 - exam shows flame hemes and IRH along inferior arcades - OCT without CME OS - no ophthalmic intervention recommended at this time, but discussed importance of tight BP control - F/U 4 weeks -- DFE/OCT/possible injection  5. Pseudophakia OU  - s/p CE/IOL OU (Dr. Cathey Endow)  - IOLs in good  position, doing well  - monitor  Ophthalmic Meds Ordered this visit:  No orders of the defined types were placed in this encounter.    Return in about 4 weeks (around 08/19/2022) for BRVO OS, Dilated Exam, OCT.  There are no Patient Instructions on file for this visit.   Explained the diagnoses, plan, and follow up with the patient and they expressed understanding.  Patient expressed understanding of the importance of proper follow up care.   This document serves as a record of services personally performed by Karie Chimera, MD, PhD. It was created on their behalf by De Blanch, an ophthalmic technician. The creation of this record is the provider's dictation and/or activities during the visit.    Electronically signed by: De Blanch, OA, 07/23/22  12:30 AM  This document serves as a record of services personally performed by Karie Chimera, MD, PhD. It was created on their behalf by Glee Arvin. Manson Passey, OA an ophthalmic technician. The creation of this record is the provider's dictation and/or activities during the visit.    Electronically signed by: Glee Arvin. Manson Passey, [place] 01.29.2024 12:30 AM  Karie Chimera, M.D., Ph.D. Diseases & Surgery of the Retina and Vitreous [facility]  I have reviewed the above documentation for accuracy and completeness, and I agree with the above. Karie Chimera, M.D., Ph.D. 07/23/22 12:36 AM   Abbreviations: M myopia (nearsighted); A astigmatism; H hyperopia (farsighted); P presbyopia; Mrx spectacle prescription;  CTL contact lenses; OD right eye; OS left eye; OU both eyes  XT exotropia; ET esotropia; PEK punctate epithelial keratitis; PEE punctate epithelial erosions; DES dry eye syndrome; MGD meibomian gland dysfunction; ATs artificial tears; PFAT's preservative free artificial tears; NSC nuclear sclerotic cataract; PSC posterior subcapsular cataract; ERM epi-retinal membrane; PVD posterior vitreous detachment; RD retinal  detachment; DM diabetes mellitus; DR diabetic retinopathy; NPDR non-proliferative diabetic retinopathy; PDR proliferative diabetic retinopathy; CSME clinically significant macular edema; DME diabetic macular edema; dbh dot blot hemorrhages; CWS cotton wool spot; POAG primary open angle glaucoma; C/D cup-to-disc ratio; HVF humphrey visual field; GVF goldmann visual field; OCT optical coherence tomography; IOP intraocular pressure; BRVO Branch retinal vein occlusion; CRVO central retinal vein occlusion; CRAO central retinal artery occlusion; BRAO branch retinal artery occlusion; RT retinal tear; SB scleral buckle; PPV pars plana vitrectomy; VH Vitreous hemorrhage; PRP panretinal laser photocoagulation; IVK intravitreal kenalog; VMT vitreomacular traction; MH Macular hole;  NVD neovascularization of the disc; NVE neovascularization elsewhere; AREDS age related eye disease study; ARMD age related macular degeneration; POAG primary open angle glaucoma; EBMD epithelial/anterior basement membrane dystrophy; ACIOL anterior chamber intraocular lens; IOL intraocular lens; PCIOL posterior chamber intraocular lens; Phaco/IOL phacoemulsification with intraocular lens placement; PRK photorefractive keratectomy; LASIK laser assisted in situ keratomileusis; HTN hypertension; DM diabetes mellitus; COPD chronic obstructive pulmonary disease

## 2022-07-20 ENCOUNTER — Emergency Department (HOSPITAL_BASED_OUTPATIENT_CLINIC_OR_DEPARTMENT_OTHER): Payer: Medicare Other

## 2022-07-20 ENCOUNTER — Emergency Department (HOSPITAL_BASED_OUTPATIENT_CLINIC_OR_DEPARTMENT_OTHER)
Admission: EM | Admit: 2022-07-20 | Discharge: 2022-07-20 | Disposition: A | Discharge status: Home / Self Care | Payer: Medicare Other | Attending: Emergency Medicine | Admitting: Emergency Medicine

## 2022-07-20 ENCOUNTER — Other Ambulatory Visit: Payer: Self-pay

## 2022-07-20 DIAGNOSIS — R519 Headache, unspecified: Secondary | ICD-10-CM | POA: Diagnosis present

## 2022-07-20 DIAGNOSIS — R110 Nausea: Secondary | ICD-10-CM | POA: Diagnosis not present

## 2022-07-20 DIAGNOSIS — Z79899 Other long term (current) drug therapy: Secondary | ICD-10-CM | POA: Insufficient documentation

## 2022-07-20 DIAGNOSIS — I10 Essential (primary) hypertension: Secondary | ICD-10-CM | POA: Diagnosis not present

## 2022-07-20 DIAGNOSIS — R4182 Altered mental status, unspecified: Secondary | ICD-10-CM | POA: Insufficient documentation

## 2022-07-20 LAB — COMPREHENSIVE METABOLIC PANEL
ALT: 19 U/L (ref 0–44)
AST: 20 U/L (ref 15–41)
Albumin: 4.5 g/dL (ref 3.5–5.0)
Alkaline Phosphatase: 59 U/L (ref 38–126)
Anion gap: 9 (ref 5–15)
BUN: 8 mg/dL (ref 8–23)
CO2: 25 mmol/L (ref 22–32)
Calcium: 10.2 mg/dL (ref 8.9–10.3)
Chloride: 103 mmol/L (ref 98–111)
Creatinine, Ser: 0.64 mg/dL (ref 0.44–1.00)
GFR, Estimated: >60 mL/min (ref >60)
Glucose, Bld: 105 mg/dL — ABNORMAL HIGH (ref 70–99)
Potassium: 4.1 mmol/L (ref 3.5–5.1)
Sodium: 137 mmol/L (ref 135–145)
Total Bilirubin: 0.4 mg/dL (ref 0.3–1.2)
Total Protein: 7.7 g/dL (ref 6.5–8.1)

## 2022-07-20 LAB — CBC
HCT: 39.2 % (ref 36.0–46.0)
Hemoglobin: 13.4 g/dL (ref 12.0–15.0)
MCH: 32.8 pg (ref 26.0–34.0)
MCHC: 34.2 g/dL (ref 30.0–36.0)
MCV: 95.8 fL (ref 80.0–100.0)
Platelets: 290 10*3/uL (ref 150–400)
RBC: 4.09 MIL/uL (ref 3.87–5.11)
RDW: 11.9 % (ref 11.5–15.5)
WBC: 6.4 10*3/uL (ref 4.0–10.5)
nRBC: 0 % (ref 0.0–0.2)

## 2022-07-20 LAB — CBG MONITORING, ED: Glucose-Capillary: 104 mg/dL — ABNORMAL HIGH (ref 70–99)

## 2022-07-20 MED ORDER — DEXAMETHASONE SODIUM PHOSPHATE 10 MG/ML IJ SOLN
10.0000 mg | Freq: Once | INTRAMUSCULAR | Status: AC
  Administered 2022-07-20: 10 mg via INTRAVENOUS
  Filled 2022-07-20: qty 1

## 2022-07-20 MED ORDER — ACETAMINOPHEN 500 MG PO TABS
1000.0000 mg | ORAL_TABLET | Freq: Once | ORAL | Status: AC
  Administered 2022-07-20: 1000 mg via ORAL
  Filled 2022-07-20: qty 2

## 2022-07-20 MED ORDER — IOHEXOL 350 MG/ML SOLN
100.0000 mL | Freq: Once | INTRAVENOUS | Status: AC | PRN
  Administered 2022-07-20: 75 mL via INTRAVENOUS

## 2022-07-20 MED ORDER — METOCLOPRAMIDE HCL 5 MG/ML IJ SOLN
10.0000 mg | Freq: Once | INTRAMUSCULAR | Status: AC
  Administered 2022-07-20: 10 mg via INTRAVENOUS
  Filled 2022-07-20: qty 2

## 2022-07-20 NOTE — ED Provider Notes (Signed)
Headache with nausea for the past few days.  BP in the 200s earlier.  Pain to R side of head/neck.  Awaits head/neck CTA.  Got migraine cocktail.  Need reeval after CT result to determine disposition.  Doubt need for MRI.  Headache is improve.    On reassessment, patient reports she is feeling much better from the migraine cocktail.  She is resting comfortably appears to be in no acute discomfort.  She does not have any focal neurodeficit on reexamination.  She is mentating appropriately.  I discussed finding of the head and neck CT scan which shows no acute finding however she does have some vasculature with 50% stenosis.  I do not think this is the cause of her symptoms however I we will give patient referral to vascular surgery for outpatient follow-up as needed.  Encourage patient to follow-up with her PCP as well for further managements of her elevated blood pressure.  Patient understands to return if her symptoms worsen.  She is stable to be discharged.  BP (!) 159/82 (BP Location: Left Arm)   Pulse 78   Temp 98 F (36.7 C) (Oral)   Resp 18   Ht 5\' 4"  (1.626 m)   Wt 61.7 kg   SpO2 97%   BMI 23.34 kg/m   Results for orders placed or performed during the hospital encounter of 07/20/22  Comprehensive metabolic panel  Result Value Ref Range   Sodium 137 135 - 145 mmol/L   Potassium 4.1 3.5 - 5.1 mmol/L   Chloride 103 98 - 111 mmol/L   CO2 25 22 - 32 mmol/L   Glucose, Bld 105 (H) 70 - 99 mg/dL   BUN 8 8 - 23 mg/dL   Creatinine, Ser 0.64 0.44 - 1.00 mg/dL   Calcium 10.2 8.9 - 10.3 mg/dL   Total Protein 7.7 6.5 - 8.1 g/dL   Albumin 4.5 3.5 - 5.0 g/dL   AST 20 15 - 41 U/L   ALT 19 0 - 44 U/L   Alkaline Phosphatase 59 38 - 126 U/L   Total Bilirubin 0.4 0.3 - 1.2 mg/dL   GFR, Estimated >60 >60 mL/min   Anion gap 9 5 - 15  CBC  Result Value Ref Range   WBC 6.4 4.0 - 10.5 K/uL   RBC 4.09 3.87 - 5.11 MIL/uL   Hemoglobin 13.4 12.0 - 15.0 g/dL   HCT 39.2 36.0 - 46.0 %   MCV 95.8 80.0  - 100.0 fL   MCH 32.8 26.0 - 34.0 pg   MCHC 34.2 30.0 - 36.0 g/dL   RDW 11.9 11.5 - 15.5 %   Platelets 290 150 - 400 K/uL   nRBC 0.0 0.0 - 0.2 %  CBG monitoring, ED  Result Value Ref Range   Glucose-Capillary 104 (H) 70 - 99 mg/dL   CT ANGIO HEAD NECK W WO CM  Result Date: 07/20/2022 CLINICAL DATA:  Initial evaluation for vertigo. EXAM: CT ANGIOGRAPHY HEAD AND NECK TECHNIQUE: Multidetector CT imaging of the head and neck was performed using the standard protocol during bolus administration of intravenous contrast. Multiplanar CT image reconstructions and MIPs were obtained to evaluate the vascular anatomy. Carotid stenosis measurements (when applicable) are obtained utilizing NASCET criteria, using the distal internal carotid diameter as the denominator. RADIATION DOSE REDUCTION: This exam was performed according to the departmental dose-optimization program which includes automated exposure control, adjustment of the mA and/or kV according to patient size and/or use of iterative reconstruction technique. CONTRAST:  25mL OMNIPAQUE  IOHEXOL 350 MG/ML SOLN COMPARISON:  Prior study from 06/27/2017. FINDINGS: CT HEAD FINDINGS Brain: Age-related cerebral atrophy with chronic small vessel ischemic disease. No acute intracranial hemorrhage. No acute large vessel territory infarct. No mass lesion or midline shift. No hydrocephalus or extra-axial fluid collection. Vascular: No abnormal hyperdense vessel. Skull: Scalp soft tissues and calvarium demonstrate no acute finding. Sinuses/Orbits: Globes orbital soft tissues within normal limits. Paranasal sinuses and mastoid air cells are clear. Other: None. Review of the MIP images confirms the above findings CTA NECK FINDINGS Aortic arch: Visualized aortic arch normal caliber with stated branch pattern. Moderate atheromatous change about the arch itself. No high-grade stenosis about the origin the great vessels. Right carotid system: Right common and internal carotid  arteries are tortuous without dissection. Moderate atheromatous change about the right carotid bulb without hemodynamically significant stenosis. Left carotid system: Left common and internal carotid arteries are tortuous. No dissection. Atheromatous plaque about the left carotid bulb with associated stenosis of up to 40-50% by NASCET criteria. Vertebral arteries: Right vertebral artery dominant. Left vertebral arteries rectally from the aortic arch. Vertebral arteries patent without stenosis or dissection. Skeleton: No discrete or worrisome osseous lesions. Moderate spondylosis present at C5-6 and C6-7. Other neck: No other acute soft tissue abnormality within the neck. Upper chest: Scattered pleuroparenchymal thickening/scarring noted about the lungs. Visualized upper chest demonstrates no other acute finding. Review of the MIP images confirms the above findings CTA HEAD FINDINGS Anterior circulation: Both internal carotid arteries patent without stenosis. A1 segments, anterior communicating complex common anterior cerebral arteries patent without stenosis. No M1 stenosis or occlusion. No proximal MCA branch occlusion. Distal MCA branches perfused and symmetric. Posterior circulation: Both V4 segments patent without stenosis. Neither PICA origin well visualized. Basilar widely patent without stenosis. Superior cerebral arteries patent bilaterally. Left PCA primarily supplied via the basilar. Fetal type origin right PCA. Both PCAs patent without stenosis. Venous sinuses: Patent allowing for timing the contrast bolus. Anatomic variants: As above. No aneurysm. Prominent DVA noted at the left cerebellum. Review of the MIP images confirms the above findings IMPRESSION: CT HEAD IMPRESSION: 1. No acute intracranial abnormality. 2. Age-related cerebral atrophy with chronic small vessel ischemic disease. CTA HEAD AND NECK IMPRESSION: 1. Negative CTA for large vessel occlusion or other emergent finding. 2. Atheromatous  plaque about the left carotid bulb with associated stenosis of up to 40-50% by NASCET criteria. 3. Moderate atheromatous change about the right carotid bulb without hemodynamically significant greater than 50% stenosis. 4. Diffuse tortuosity of the major arterial vasculature of the head and neck, suggesting chronic underlying hypertension. Electronically Signed   By: Jeannine Boga M.D.   On: 07/20/2022 21:29       Domenic Moras, PA-C 07/20/22 2203    Tegeler, Gwenyth Allegra, MD 07/20/22 2206

## 2022-07-20 NOTE — ED Triage Notes (Signed)
Patient arrives with complaints of increased headache confusion, and HTN  x1 week. Sent here by Urgent Care for further eval. UA sample taken at Urgent Care did not show a UTI

## 2022-07-20 NOTE — Discharge Instructions (Addendum)
Please follow up with your PCP. Please take 1000mg  of Tylenol every 6 hours as needed for pain. If your have any concerns, new or worsening symptoms, please return to the nearest ER for re-evaluation. Please also discuss with your primary care doctor that you have some evidence of plaque buildup in your blood vessels up to 50% stenosis.  You may need to follow-up with the vascular surgeon for outpatient evaluation.  Contact a doctor if: Medicine does not help your symptoms. You have a headache that feels different than the other headaches. You feel like you may vomit (nauseous) or you vomit. You have a fever. Get help right away if: Your headache: Gets very bad quickly. Gets worse after a lot of physical activity. You have any of these symptoms: You continue to vomit. A stiff neck. Trouble seeing. Your eye or ear hurts. Trouble speaking. Weak muscles or you lose muscle control. You lose your balance or have trouble walking. You feel like you will pass out (faint) or you pass out. You are mixed up (confused). You have a seizure. These symptoms may be an emergency. Get help right away. Call your local emergency services (911 in the [place] Do not wait to see if the symptoms will go away. Do not drive yourself to the hospital.

## 2022-07-20 NOTE — ED Provider Notes (Signed)
[facility] Provider Note   CSN: [id number] Arrival date & time: 07/20/22  1248     History Chief Complaint  Patient presents with   Altered Mental Status   Headache    Phyllis [name] is a 82 y.o. female with history of anemia and hypertension presents the emergency room today for evaluation of headaches and high blood pressure.  Patient reports that she has a longstanding history of migraine however this does feel different.  It is mainly in the right side of her head going back down her neck.  It is present today but was worse yesterday.  Patient reports that she forgot take her blood pressure medication for around 2 to 3 days.  She reports her blood pressure was systolically in the 409W however she did take her blood pressure medication this morning and is feeling better.  Last night she was feeling weak, but symptoms have resolved.  She reports that she feels more lightheaded at times but denies any room spinning sensation.  She has some nausea yesterday but none today.  No chest pain, shortness of breath, Donnell pain, vomiting, diarrhea, constipation, dysuria, hematuria, fever, or recent cough or cold symptoms.  She reports that she tends to always have some phonophobia but denies any photophobia, or blurry vision.  She reports that she think she is having some urinary urgency frequency, however she reports that she has been drinking more water than usual trying to stay well-hydrated as well I think this may be the case.  Her friend is at bedside and mentions that she was more confused yesterday.  I see the patient's been seen for this earlier this month for confusion and given donepezil.   Altered Mental Status Associated symptoms: headaches, light-headedness and nausea   Associated symptoms: no abdominal pain, no fever and no vomiting   Headache Associated symptoms: fatigue and nausea   Associated symptoms: no abdominal pain, no back pain,  no congestion, no cough, no diarrhea, no dizziness, no fever, no neck pain, no photophobia and no vomiting        Home Medications Prior to Admission medications   Medication Sig Start Date End Date Taking? Authorizing Provider  acetaminophen (TYLENOL) 325 MG tablet Take 2 tablets (650 mg total) by mouth every 6 (six) hours as needed for mild pain, moderate pain or fever. 05/31/20   Hongalgi, Lenis Dickinson, MD  alendronate (FOSAMAX) 70 MG tablet Take 70 mg by mouth every Saturday. 01/12/18   [provider]  Ascorbic Acid (VITAMIN C) 100 MG tablet Take 100 mg by mouth daily.    [provider]  butalbital-acetaminophen-caffeine (FIORICET) 50-325-40 MG tablet Take 1 tablet by mouth 2 (two) times daily as needed for headache.    [provider]  Cholecalciferol (VITAMIN D3) 25 MCG (1000 UT) CAPS Take 1 capsule by mouth daily.    [provider]  Ferrous Sulfate Dried (HIGH POTENCY IRON) 65 MG TABS Take 1 tablet by mouth daily.    [provider]  hydrOXYzine (ATARAX/VISTARIL) 10 MG tablet Take 5 mg by mouth at bedtime as needed (For sleep).     [provider]  losartan (COZAAR) 50 MG tablet Take 50 mg by mouth daily.    [provider]  Magnesium 250 MG TABS Take 1 tablet (250 mg total) by mouth daily. 05/31/20   Hongalgi, Lenis Dickinson, MD  Melatonin 10 MG TABS Take 1 tablet by mouth at bedtime. 05/31/20   Hongalgi,  Maximino Greenland, MD  Multiple Vitamin (MULTIVITAMIN) tablet Take 1 tablet by mouth daily.    [provider]  oxyCODONE (OXY IR/ROXICODONE) 5 MG immediate release tablet Take 1 tablet (5 mg total) by mouth every 6 (six) hours as needed for breakthrough pain. 05/31/20   Maczis, Elmer Sow, PA-C  pantoprazole (PROTONIX) 40 MG tablet Take 1 tablet (40 mg total) by mouth 2 (two) times daily. Patient taking differently: Take 40 mg by mouth daily.  03/10/18 05/27/20  Arrien, York Ram, MD  polyethylene glycol (MIRALAX) 17 g packet Take 17  g by mouth daily as needed for mild constipation. 05/31/20   Maczis, Elmer Sow, PA-C  pravastatin (PRAVACHOL) 40 MG tablet Take 40 mg by mouth daily.    [provider]  venlafaxine XR (EFFEXOR-XR) 150 MG 24 hr capsule Take 150 mg by mouth daily with breakfast.    [provider]  VITAMIN B COMPLEX-C PO Take 1 tablet by mouth daily.    [provider]  vitamin B-12 (CYANOCOBALAMIN) 1000 MCG tablet Take 1,000 mcg by mouth daily.    [provider]      Allergies    Azithromycin    Review of Systems   Review of Systems  Constitutional:  Positive for fatigue. Negative for chills and fever.  HENT:  Negative for congestion and rhinorrhea.   Eyes:  Negative for photophobia and visual disturbance.  Respiratory:  Negative for cough and shortness of breath.   Cardiovascular:  Negative for chest pain.  Gastrointestinal:  Positive for nausea. Negative for abdominal pain, constipation, diarrhea and vomiting.  Genitourinary:  Positive for frequency and urgency. Negative for dysuria.  Musculoskeletal:  Negative for back pain and neck pain.  Neurological:  Positive for light-headedness and headaches. Negative for dizziness and syncope.    Physical Exam Updated Vital Signs BP (!) 133/105   Pulse 88   Temp 98.2 F (36.8 C) (Oral)   Resp 14   Ht 5\' 4"  (1.626 m)   Wt 61.7 kg   SpO2 98%   BMI 23.34 kg/m  Physical Exam Vitals and nursing note reviewed.  Constitutional:      General: She is not in acute distress.    Appearance: She is not ill-appearing or toxic-appearing.  HENT:     Head: Normocephalic and atraumatic.     Comments: Temples and scalp nontender to light touch.  No overlying skin changes visualized. Eyes:     Extraocular Movements: Extraocular movements intact.     Pupils: Pupils are equal, round, and reactive to light.  Cardiovascular:     Rate and Rhythm: Normal rate.  Pulmonary:     Effort: Pulmonary effort is normal. No respiratory  distress.  Abdominal:     Palpations: Abdomen is soft.     Tenderness: There is no abdominal tenderness. There is no guarding.  Musculoskeletal:     Cervical back: Normal range of motion. No rigidity.  Skin:    General: Skin is warm and dry.  Neurological:     Mental Status: She is alert and oriented to person, place, and time.     GCS: GCS eye subscore is 4. GCS verbal subscore is 5. GCS motor subscore is 6.     Cranial Nerves: No cranial nerve deficit, dysarthria or facial asymmetry.     Sensory: No sensory deficit.     Motor: No weakness or pronator drift.     Coordination: Finger-Nose-Finger Test normal.     Comments: Patient alert and oriented  x 3.  Cranial nerves II through XII intact.  No facial asymmetry noted.  Patient is answering questions appropriately with appropriate speech.  Sensation intact throughout.  Strength is 5-5 in patient's upper and lower extremities.  No pronator drift.  Normal finger-nose-finger with bilateral hands.     ED Results / Procedures / Treatments   Labs (all labs ordered are listed, but only abnormal results are displayed) Labs Reviewed  COMPREHENSIVE METABOLIC PANEL - Abnormal; Notable for the following components:      Result Value   Glucose, Bld 105 (*)    All other components within normal limits  CBG MONITORING, ED - Abnormal; Notable for the following components:   Glucose-Capillary 104 (*)    All other components within normal limits  CBC  URINALYSIS, ROUTINE W REFLEX MICROSCOPIC    EKG EKG Interpretation  Date/Time:  Saturday July 20 2022 16:27:21 EST Ventricular Rate:  90 PR Interval:  129 QRS Duration: 79 QT Interval:  340 QTC Calculation: 416 R Axis:   30 Text Interpretation: Sinus rhythm Abnormal R-wave progression, early transition Borderline repolarization abnormality when compared to prior, similar appearance. No STEMI Confirmed by Antony Blackbird [phone]) on 07/20/2022 4:59:35 PM  Radiology No results  found.  Procedures Procedures   Medications Ordered in ED Medications - No data to display  ED Course/ Medical Decision Making/ A&P                           Medical Decision Making Amount and/or Complexity of Data Reviewed Labs: ordered. Radiology: ordered.  Risk OTC drugs. Prescription drug management.   83 year old female presents the emergency room today for evaluation of headache, high blood pressure, confusion.  Differential diagnosis includes was limited to Intracranial hemorrhage, meningitis, CVA, intracranial tumor, venous sinus thrombosis, migraine, cluster headache, hypertension, drug related, pseudotumor cerebri, AVM, head injury, tension headache, sinusitis, dental abscess, otitis media, TMJ, depression, temporal arteritis, glaucoma, trigeminal neuralgia, hypertensive urgency, hypertensive emergency.  Vital signs show blood pressure 133/105, afebrile, normal pulse rate, satting well on room air without increased work of breathing.  Physical exam as noted above.  Patient was seen in urgent care today and was sent over here for further evaluation given her age with confusion and headache.  I independently reviewed and interpreted the patient's labs.  CMP shows glucose of 105 otherwise no electrolyte or LFT abnormality.  CBC with a leukocytosis or anemia.  CBG within normal limits.  Urinalysis shows needs to be collected.  Patient reports that her head feels better whenever she turns it to the sides.  Will order CT angio of head and neck.  CT angio needs to be done still.  Migraine cocktail ordered with Tylenol, Reglan, and decadron.   7:00 PM Care of MECHELL GIRGIS transferred to PA Domenic Moras at the end of my shift as the patient will require reassessment once labs/imaging have resulted. Patient presentation, ED course, and plan of care discussed with review of all pertinent labs and imaging. Please see his/her note for further details regarding further ED course and  disposition. Plan at time of handoff is follow up with CT Angio and urinalysis. Please re-evaluate after medications. This may be altered or completely changed at the discretion of the oncoming team pending results of further workup.  Final Clinical Impression(s) / ED Diagnoses Final diagnoses:  None    Rx / DC Orders ED Discharge Orders     None  Achille Rich, PA-C 07/20/22 1901    Tegeler, Canary Brim, MD 07/20/22 2046

## 2022-07-22 ENCOUNTER — Ambulatory Visit (INDEPENDENT_AMBULATORY_CARE_PROVIDER_SITE_OTHER): Payer: Medicare Other | Admitting: Ophthalmology

## 2022-07-22 VITALS — BP 121/86 | HR 74

## 2022-07-22 DIAGNOSIS — H3562 Retinal hemorrhage, left eye: Secondary | ICD-10-CM

## 2022-07-22 DIAGNOSIS — H3581 Retinal edema: Secondary | ICD-10-CM

## 2022-07-22 DIAGNOSIS — Z961 Presence of intraocular lens: Secondary | ICD-10-CM

## 2022-07-22 DIAGNOSIS — H348322 Tributary (branch) retinal vein occlusion, left eye, stable: Secondary | ICD-10-CM

## 2022-07-22 DIAGNOSIS — I10 Essential (primary) hypertension: Secondary | ICD-10-CM

## 2022-07-22 DIAGNOSIS — H35033 Hypertensive retinopathy, bilateral: Secondary | ICD-10-CM

## 2022-07-23 ENCOUNTER — Encounter (INDEPENDENT_AMBULATORY_CARE_PROVIDER_SITE_OTHER): Payer: Self-pay | Admitting: Ophthalmology

## 2022-08-12 NOTE — Progress Notes (Signed)
[facility] Note  08/19/2022     CHIEF COMPLAINT Patient presents for Retina Follow Up   HISTORY OF PRESENT ILLNESS: Phyllis [name] is a 82 y.o. female who presents to the clinic today for:   HPI     Retina Follow Up   Patient presents with  CRVO/BRVO.  In left eye.  This started 4 weeks ago.  I, the attending physician,  performed the HPI with the patient and updated documentation appropriately.        Comments   Patient here for 4 weeks retina follow up for BRVO OS. Patient states vision doing fine. No eye pain.       Last edited by Bernarda Caffey, MD on 08/19/2022  9:17 PM.    Pt states systolic BP is still running around 160-170, she has an appt to see her dr soon  Referring physician: Christain Sacramento, MD [address] [place],  [place] [address]  HISTORICAL INFORMATION:   Selected notes from the MEDICAL RECORD NUMBER Referred by Dr. Valetta Close for BRVO OS LEE:  Ocular Hx- PMH-    CURRENT MEDICATIONS: No current outpatient medications on file. (Ophthalmic Drugs)   No current facility-administered medications for this visit. (Ophthalmic Drugs)   Current Outpatient Medications (Other)  Medication Sig   acetaminophen (TYLENOL) 325 MG tablet Take 2 tablets (650 mg total) by mouth every 6 (six) hours as needed for mild pain, moderate pain or fever.   alendronate (FOSAMAX) 70 MG tablet Take 70 mg by mouth every Saturday.   Ascorbic Acid (VITAMIN C) 100 MG tablet Take 100 mg by mouth daily.   butalbital-acetaminophen-caffeine (FIORICET) 50-325-40 MG tablet Take 1 tablet by mouth 2 (two) times daily as needed for headache.   Cholecalciferol (VITAMIN D3) 25 MCG (1000 UT) CAPS Take 1 capsule by mouth daily.   Ferrous Sulfate Dried (HIGH POTENCY IRON) 65 MG TABS Take 1 tablet by mouth daily.   hydrOXYzine (ATARAX/VISTARIL) 10 MG tablet Take 5 mg by mouth at bedtime as needed (For sleep).    losartan (COZAAR) 50 MG tablet Take 50 mg by mouth  daily.   Magnesium 250 MG TABS Take 1 tablet (250 mg total) by mouth daily.   Melatonin 10 MG TABS Take 1 tablet by mouth at bedtime.   Multiple Vitamin (MULTIVITAMIN) tablet Take 1 tablet by mouth daily.   oxyCODONE (OXY IR/ROXICODONE) 5 MG immediate release tablet Take 1 tablet (5 mg total) by mouth every 6 (six) hours as needed for breakthrough pain.   polyethylene glycol (MIRALAX) 17 g packet Take 17 g by mouth daily as needed for mild constipation.   pravastatin (PRAVACHOL) 40 MG tablet Take 40 mg by mouth daily.   venlafaxine XR (EFFEXOR-XR) 150 MG 24 hr capsule Take 150 mg by mouth daily with breakfast.   VITAMIN B COMPLEX-C PO Take 1 tablet by mouth daily.   vitamin B-12 (CYANOCOBALAMIN) 1000 MCG tablet Take 1,000 mcg by mouth daily.   pantoprazole (PROTONIX) 40 MG tablet Take 1 tablet (40 mg total) by mouth 2 (two) times daily. (Patient taking differently: Take 40 mg by mouth daily. )   No current facility-administered medications for this visit. (Other)   REVIEW OF SYSTEMS: ROS   Positive for: Eyes, Psychiatric Last edited by Theodore Demark, COA on 08/19/2022  1:50 PM.      ALLERGIES Allergies  Allergen Reactions   Azithromycin     Developed a rash right after she quit taking  it   PAST MEDICAL HISTORY Past Medical History:  Diagnosis Date   Abdominal pain, unspecified site    Acute sinusitis, unspecified    Complication of anesthesia    Hard toe wake up from too much medicine   Contact dermatitis and other eczema due to plants (except food)    Cough    Cramp of limb    Depression    Depressive disorder, not elsewhere classified    Essential hypertension, benign    Headache(784.0)    High cholesterol    Hypertension    Lumbago    Migraine    Observation for suspected cardiovascular disease    Osteoporosis, unspecified    Other and unspecified hyperlipidemia    Rash and other nonspecific skin eruption    Rectocele    Reflux esophagitis    Unspecified  constipation    Past Surgical History:  Procedure Laterality Date   APPENDECTOMY     BREAST BIOPSY     Percutaneous needle core   CHOLECYSTECTOMY N/A 05/28/2020   Procedure: LAPAROSCOPIC CHOLECYSTECTOMY;  Surgeon: Greer Pickerel, MD;  Location: [facility];  Service: General;  Laterality: N/A;   ESOPHAGOGASTRODUODENOSCOPY (EGD) WITH PROPOFOL N/A 03/08/2018   Procedure: ESOPHAGOGASTRODUODENOSCOPY (EGD) WITH PROPOFOL;  Surgeon: Arta Silence, MD;  Location: [place];  Service: Endoscopy;  Laterality: N/A;   FAMILY HISTORY Family History  Problem Relation Age of Onset   Hypertension Other    Coronary artery disease Other    Heart disease Mother    ADD / ADHD Son    SOCIAL HISTORY Social History   Tobacco Use   Smoking status: Former    Packs/day: 2.00    Years: 12.00    Total pack years: 24.00    Types: Cigarettes    Quit date: 06/24/1985    Years since quitting: 37.1   Smokeless tobacco: Never  Vaping Use   Vaping Use: Never used  Substance Use Topics   Alcohol use: No   Drug use: No       OPHTHALMIC EXAM:  Base Eye Exam     Visual Acuity (Snellen - Linear)       Right Left   Dist cc 20/40 +2 20/25 -2   Dist ph cc NI NI    Correction: Glasses         Tonometry (Tonopen, 1:49 PM)       Right Left   Pressure 13 12         Pupils       Dark Light Shape React APD   Right 4 3 Round Brisk None   Left 4 3 Round Brisk None         Visual Fields (Counting fingers)       Left Right    Full Full         Extraocular Movement       Right Left    Full, Ortho Full, Ortho         Neuro/Psych     Oriented x3: Yes   Mood/Affect: Normal         Dilation     Both eyes: 1.0% Mydriacyl, 2.5% Phenylephrine @ 1:49 PM           Slit Lamp and Fundus Exam     Slit Lamp Exam       Right Left   Lids/Lashes Dermatochalasis - upper lid, mild MGD Dermatochalasis - upper lid, mild MGD   Conjunctiva/Sclera White and quiet White and quiet  Cornea  trace PEE, trace, fine endo pigment Arcus, trace PEE, trace, fine endo pigment, tear film debris   Anterior Chamber deep and clear deep and clear   Iris Round and dilated Round and dilated   Lens PC IOL in good position with open PC PC IOL in good position with open PC   Anterior Vitreous Vitreous syneresis, Posterior vitreous detachment Vitreous syneresis, Posterior vitreous detachment, vitreous condensations, old VH settled inferiorly         Fundus Exam       Right Left   Disc mild Pallor, Sharp rim, +PPA mild Pallor, Sharp rim, inferior PPA   C/D Ratio 0.2 0.1   Macula Flat, Blunted foveal reflex, RPE mottling and clumping, No heme or edema Flat, Blunted foveal reflex, RPE mottling and clumping, early atrophy, persistent IT IRH -- ?slightly improved   Vessels mild attenuation, mild tortuosity attenuated, Tortuous, vascular loops, +IRH along IT arcades   Periphery Attached Attached           Refraction     Wearing Rx       Sphere Cylinder Axis Add   Right -1.25 +2.50 101 +2.50   Left -1.00 +1.75 100 +2.50           IMAGING AND PROCEDURES  Imaging and Procedures for 08/19/2022  OCT, Retina - OU - Both Eyes       Right Eye Quality was good. Central Foveal Thickness: 308. Progression has worsened. Findings include normal foveal contour, no SRF, intraretinal fluid (Mild ERM, focal cystic changes inferior fovea and temporal macula).   Left Eye Quality was good. Central Foveal Thickness: 291. Progression has been stable. Findings include normal foveal contour, no IRF, no SRF, myopic contour (No CME).   Notes *Images captured and stored on drive  Diagnosis / Impression:  NFP, no IRF/SRF OU OD: Mild ERM, focal cystic changes inferior fovea and temporal macula OS: no CME  Clinical management:  See below  Abbreviations: NFP - Normal foveal profile. CME - cystoid macular edema. PED - pigment epithelial detachment. IRF - intraretinal fluid. SRF - subretinal fluid. EZ  - ellipsoid zone. ERM - epiretinal membrane. ORA - outer retinal atrophy. ORT - outer retinal tubulation. SRHM - subretinal hyper-reflective material. IRHM - intraretinal hyper-reflective material            ASSESSMENT/PLAN:    ICD-10-CM   1. Retinal hemorrhage of left eye  H35.62 OCT, Retina - OU - Both Eyes    2. 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 Essential hypertension  I10     4. Hypertensive retinopathy of both eyes  H35.033     5. Pseudophakia, both eyes  Z96.1      1-4. BRVO with retinal hemorrhages OS  - pt reports recent ED visit (01.27.24) for elevated BP to >200 SBP, >100 DBP  - pt reports memory problems that likely contributed to noncompliance of BP meds - BCVA 20/25 - exam shows flame hemes and IRH along inferior arcades -- persistent, ?slightly improved - OCT without CME OS - no ophthalmic intervention recommended at this time, but discussed importance of tight BP control - F/U 2 months, sooner prn -- DFE/OCT/possible injection  5. Pseudophakia OU  - s/p CE/IOL OU (Dr. Valetta Close)  - IOLs in good position, doing well  - monitor  Ophthalmic Meds Ordered this visit:  No orders of the defined types were placed in this encounter.    Return in  about 2 months (around 10/18/2022) for f/u BRVO OS, DFE, OCT.  There are no Patient Instructions on file for this visit.   Explained the diagnoses, plan, and follow up with the patient and they expressed understanding.  Patient expressed understanding of the importance of proper follow up care.   This document serves as a record of services personally performed by Gardiner Sleeper, MD, PhD. It was created on their behalf by Orvan Falconer, an ophthalmic technician. The creation of this record is the provider's dictation and/or activities during the visit.    Electronically signed by: Orvan Falconer, OA, 08/19/22  9:18 PM  This document serves as a record of services personally performed by  Gardiner Sleeper, MD, PhD. It was created on their behalf by San Jetty. Owens Shark, OA an ophthalmic technician. The creation of this record is the provider's dictation and/or activities during the visit.    Electronically signed by: San Jetty. Owens Shark, [place] 02.26.2024 9:18 PM   Gardiner Sleeper, M.D., Ph.D. Diseases & Surgery of the Retina and Vitreous [facility]  I have reviewed the above documentation for accuracy and completeness, and I agree with the above. Gardiner Sleeper, M.D., Ph.D. 08/19/22 9:19 PM  Abbreviations: M myopia (nearsighted); A astigmatism; H hyperopia (farsighted); P presbyopia; Mrx spectacle prescription;  CTL contact lenses; OD right eye; OS left eye; OU both eyes  XT exotropia; ET esotropia; PEK punctate epithelial keratitis; PEE punctate epithelial erosions; DES dry eye syndrome; MGD meibomian gland dysfunction; ATs artificial tears; PFAT's preservative free artificial tears; [place] nuclear sclerotic cataract; PSC posterior subcapsular cataract; ERM epi-retinal membrane; PVD posterior vitreous detachment; RD retinal detachment; DM diabetes mellitus; DR diabetic retinopathy; NPDR non-proliferative diabetic retinopathy; PDR proliferative diabetic retinopathy; CSME clinically significant macular edema; DME diabetic macular edema; dbh dot blot hemorrhages; CWS cotton wool spot; POAG primary open angle glaucoma; C/D cup-to-disc ratio; HVF humphrey visual field; GVF goldmann visual field; OCT optical coherence tomography; IOP intraocular pressure; BRVO Branch retinal vein occlusion; CRVO central retinal vein occlusion; CRAO central retinal artery occlusion; BRAO branch retinal artery occlusion; RT retinal tear; SB scleral buckle; PPV pars plana vitrectomy; VH Vitreous hemorrhage; PRP panretinal laser photocoagulation; IVK intravitreal kenalog; VMT vitreomacular traction; MH Macular hole;  NVD neovascularization of the disc; NVE neovascularization elsewhere; AREDS age related  eye disease study; ARMD age related macular degeneration; POAG primary open angle glaucoma; EBMD epithelial/anterior basement membrane dystrophy; ACIOL anterior chamber intraocular lens; IOL intraocular lens; PCIOL posterior chamber intraocular lens; Phaco/IOL phacoemulsification with intraocular lens placement; [place] photorefractive keratectomy; LASIK laser assisted in situ keratomileusis; HTN hypertension; DM diabetes mellitus; COPD chronic obstructive pulmonary disease

## 2022-08-19 ENCOUNTER — Encounter (INDEPENDENT_AMBULATORY_CARE_PROVIDER_SITE_OTHER): Payer: Self-pay | Admitting: Ophthalmology

## 2022-08-19 ENCOUNTER — Ambulatory Visit (INDEPENDENT_AMBULATORY_CARE_PROVIDER_SITE_OTHER): Payer: Medicare Other | Admitting: Ophthalmology

## 2022-08-19 DIAGNOSIS — Z961 Presence of intraocular lens: Secondary | ICD-10-CM

## 2022-08-19 DIAGNOSIS — H348322 Tributary (branch) retinal vein occlusion, left eye, stable: Secondary | ICD-10-CM

## 2022-08-19 DIAGNOSIS — I10 Essential (primary) hypertension: Secondary | ICD-10-CM

## 2022-08-19 DIAGNOSIS — H35033 Hypertensive retinopathy, bilateral: Secondary | ICD-10-CM

## 2022-08-19 DIAGNOSIS — H3562 Retinal hemorrhage, left eye: Secondary | ICD-10-CM | POA: Diagnosis not present

## 2022-08-26 ENCOUNTER — Ambulatory Visit: Payer: Medicare Other | Admitting: Vascular Surgery

## 2022-08-26 ENCOUNTER — Encounter: Payer: Self-pay | Admitting: Vascular Surgery

## 2022-08-26 VITALS — BP 102/68 | HR 78 | Temp 97.6°F | Resp 14 | Ht 64.5 in | Wt 128.0 lb

## 2022-08-26 DIAGNOSIS — I6523 Occlusion and stenosis of bilateral carotid arteries: Secondary | ICD-10-CM

## 2022-08-26 NOTE — Progress Notes (Signed)
[facility]  ASSESSMENT / PLAN: Phyllis [name] is a 82 y.o. female with asymptomatic bilateral carotid artery stenosis (left 40 to 50%, right less than 50%)  The patient should continue best medical therapy for carotid artery stenosis including: Complete cessation from all tobacco products. Blood glucose control with goal A1c < 7%. Blood pressure control with goal blood pressure < 140/90 mmHg. Lipid reduction therapy with goal LDL-C <100 mg/dL (<70 if symptomatic from carotid artery stenosis).  Aspirin '81mg'$  PO QD.  Atorvastatin 40-'80mg'$  PO QD (or other "high intensity" statin therapy).  Will see patient again in 12 months with carotid artery duplex.  CHIEF COMPLAINT: Carotid artery stenosis  HISTORY OF PRESENT ILLNESS: Phyllis [name] is a 82 y.o. female referred to clinic for carotid artery stenosis identified during ER evaluation in late January.  The patient presented to care for evaluation of headache with nausea.  Patient had forgotten to take her blood pressure medicine.  She reports chronic issues with memory.  Her friend is with her today, and confirms this worry.  The patient underwent a CT angiogram of her head and neck.  This showed mild to moderate stenosis in bilateral carotid arteries.  On my evaluation today, the patient reports no focal neurologic symptoms.  She denies amaurosis, facial droop, dysarthria, or unilateral weakness.   Past Medical History:  Diagnosis Date   Abdominal pain, unspecified site    Acute sinusitis, unspecified    Complication of anesthesia    Hard toe wake up from too much medicine   Contact dermatitis and other eczema due to plants (except food)    Cough    Cramp of limb    Depression    Depressive disorder, not elsewhere classified    Essential hypertension, benign    Headache(784.0)    High cholesterol    Hypertension    Lumbago    Migraine    Observation for suspected cardiovascular disease     Osteoporosis, unspecified    Other and unspecified hyperlipidemia    Rash and other nonspecific skin eruption    Rectocele    Reflux esophagitis    Unspecified constipation     Past Surgical History:  Procedure Laterality Date   APPENDECTOMY     BREAST BIOPSY     Percutaneous needle core   CHOLECYSTECTOMY N/A 05/28/2020   Procedure: LAPAROSCOPIC CHOLECYSTECTOMY;  Surgeon: Greer Pickerel, MD;  Location: [facility];  Service: General;  Laterality: N/A;   ESOPHAGOGASTRODUODENOSCOPY (EGD) WITH PROPOFOL N/A 03/08/2018   Procedure: ESOPHAGOGASTRODUODENOSCOPY (EGD) WITH PROPOFOL;  Surgeon: Arta Silence, MD;  Location: [facility];  Service: Endoscopy;  Laterality: N/A;    Family History  Problem Relation Age of Onset   Hypertension Other    Coronary artery disease Other    Heart disease Mother    ADD / ADHD Son     Social History   Socioeconomic History   Marital status: Widowed    Spouse name: Evan Verney   Number of children: 1   Years of education: 12   Highest education level: Not on file  Occupational History   Occupation: retired  Tobacco Use   Smoking status: Former    Packs/day: 2.00    Years: 12.00    Total pack years: 24.00    Types: Cigarettes    Quit date: 06/24/1985    Years since quitting: 37.1   Smokeless tobacco: Never  Vaping Use   Vaping Use: Never used  Substance and Sexual Activity  Alcohol use: No   Drug use: No   Sexual activity: Not on file  Other Topics Concern   Not on file  Social History Narrative   Lives at home alone   Right handed   Drinks 1 cup of caffeinated coffee & about 6 cups of decaf drinks daily   Social Determinants of Health   Financial Resource Strain: Not on file  Food Insecurity: Not on file  Transportation Needs: Not on file  Physical Activity: Not on file  Stress: Not on file  Social Connections: Not on file  Intimate Partner Violence: Not on file    Allergies  Allergen Reactions   Azithromycin     Developed a  rash right after she quit taking it    Current Outpatient Medications  Medication Sig Dispense Refill   acetaminophen (TYLENOL) 325 MG tablet Take 2 tablets (650 mg total) by mouth every 6 (six) hours as needed for mild pain, moderate pain or fever.     alendronate (FOSAMAX) 70 MG tablet Take 70 mg by mouth every Saturday.     Ascorbic Acid (VITAMIN C) 100 MG tablet Take 100 mg by mouth daily.     butalbital-acetaminophen-caffeine (FIORICET) 50-325-40 MG tablet Take 1 tablet by mouth 2 (two) times daily as needed for headache.     Cholecalciferol (VITAMIN D3) 25 MCG (1000 UT) CAPS Take 1 capsule by mouth daily.     Ferrous Sulfate Dried (HIGH POTENCY IRON) 65 MG TABS Take 1 tablet by mouth daily.     hydrOXYzine (ATARAX/VISTARIL) 10 MG tablet Take 5 mg by mouth at bedtime as needed (For sleep).      losartan (COZAAR) 50 MG tablet Take 50 mg by mouth daily.     Magnesium 250 MG TABS Take 1 tablet (250 mg total) by mouth daily.     Melatonin 10 MG TABS Take 1 tablet by mouth at bedtime.     Multiple Vitamin (MULTIVITAMIN) tablet Take 1 tablet by mouth daily.     polyethylene glycol (MIRALAX) 17 g packet Take 17 g by mouth daily as needed for mild constipation. 14 each 0   pravastatin (PRAVACHOL) 40 MG tablet Take 40 mg by mouth daily.     venlafaxine XR (EFFEXOR-XR) 150 MG 24 hr capsule Take 150 mg by mouth daily with breakfast.     VITAMIN B COMPLEX-C PO Take 1 tablet by mouth daily.     vitamin B-12 (CYANOCOBALAMIN) 1000 MCG tablet Take 1,000 mcg by mouth daily.     oxyCODONE (OXY IR/ROXICODONE) 5 MG immediate release tablet Take 1 tablet (5 mg total) by mouth every 6 (six) hours as needed for breakthrough pain. (Patient not taking: Reported on 08/26/2022) 15 tablet 0   pantoprazole (PROTONIX) 40 MG tablet Take 1 tablet (40 mg total) by mouth 2 (two) times daily. (Patient taking differently: Take 40 mg by mouth daily. ) 60 tablet 0   No current facility-administered medications for this visit.     PHYSICAL EXAM Vitals:   08/26/22 1019 08/26/22 1023  BP: 108/69 102/68  Pulse: 84 78  Resp: 14   Temp: 97.6 F (36.4 C)   TempSrc: Temporal   SpO2: 99%   Weight: 128 lb (58.1 kg)   Height: 5' 4.5" (1.638 m)    Well-appearing elderly woman in no distress Regular rate and rhythm Unlabored breathing 2+ radial pulses bilaterally   PERTINENT LABORATORY AND RADIOLOGIC DATA  Most recent CBC    Latest Ref Rng & Units 07/20/2022  1:16 PM 05/31/2020   12:50 AM 05/30/2020    8:08 AM  CBC  WBC 4.0 - 10.5 K/uL 6.4  8.6  10.3   Hemoglobin 12.0 - 15.0 g/dL 13.4  11.7  11.2   Hematocrit 36.0 - 46.0 % 39.2  34.7  33.2   Platelets 150 - 400 K/uL 290  253  235      Most recent CMP    Latest Ref Rng & Units 07/20/2022    1:16 PM 05/31/2020    2:08 PM 05/31/2020   12:50 AM  CMP  Glucose 70 - 99 mg/dL 105  102  96   BUN 8 - 23 mg/dL '8  5  6   '$ Creatinine 0.44 - 1.00 mg/dL 0.64  0.56  0.51   Sodium 135 - 145 mmol/L 137  135  139   Potassium 3.5 - 5.1 mmol/L 4.1  3.1  2.4   Chloride 98 - 111 mmol/L 103  100  104   CO2 22 - 32 mmol/L '25  24  24   '$ Calcium 8.9 - 10.3 mg/dL 10.2  8.8  8.7   Total Protein 6.5 - 8.1 g/dL 7.7   5.8   Total Bilirubin 0.3 - 1.2 mg/dL 0.4   0.5   Alkaline Phos 38 - 126 U/L 59   95   AST 15 - 41 U/L 20   28   ALT 0 - 44 U/L 19   37    CT angiogram 1. Negative CTA for large vessel occlusion or other emergent finding. 2. Atheromatous plaque about the left carotid bulb with associated stenosis of up to 40-50% by NASCET criteria. 3. Moderate atheromatous change about the right carotid bulb without hemodynamically significant greater than 50% stenosis. 4. Diffuse tortuosity of the major arterial vasculature of the head and neck, suggesting chronic underlying hypertension.  Yevonne Aline. Stanford Breed, MD FACS [facility] Phone Number: [phone] 08/26/2022 11:07 AM   Total time spent on preparing this encounter including  chart review, data review, collecting history, examining the patient, coordinating care for this new patient, 60 minutes.  Portions of this report may have been transcribed using voice recognition software.  Every effort has been made to ensure accuracy; however, inadvertent computerized transcription errors may still be present.

## 2022-10-15 NOTE — Progress Notes (Signed)
[facility] - Clinic Note  10/21/2022     CHIEF COMPLAINT Patient presents for Retina Follow Up   HISTORY OF PRESENT ILLNESS: Phyllis [name] is a 82 y.o. female who presents to the clinic today for:   HPI     Retina Follow Up   Patient presents with  CRVO/BRVO.  In left eye.  This started months ago.  Duration of 2 months.  I, the attending physician,  performed the HPI with the patient and updated documentation appropriately.        Comments   Patient feels that it is getting harder to read. She is using AT's OU PRN.       Last edited by Rennis Chris, MD on 10/21/2022  1:33 PM.    Pt feels like it is getting harder for her to read  Referring physician: Sinda Du, MD [address] [place],  [place] [address]  HISTORICAL INFORMATION:   Selected notes from the MEDICAL RECORD NUMBER Referred by Dr. Cathey Endow for BRVO OS LEE:  Ocular Hx- PMH-    CURRENT MEDICATIONS: No current outpatient medications on file. (Ophthalmic Drugs)   No current facility-administered medications for this visit. (Ophthalmic Drugs)   Current Outpatient Medications (Other)  Medication Sig   acetaminophen (TYLENOL) 325 MG tablet Take 2 tablets (650 mg total) by mouth every 6 (six) hours as needed for mild pain, moderate pain or fever.   alendronate (FOSAMAX) 70 MG tablet Take 70 mg by mouth every Saturday.   Ascorbic Acid (VITAMIN C) 100 MG tablet Take 100 mg by mouth daily.   butalbital-acetaminophen-caffeine (FIORICET) 50-325-40 MG tablet Take 1 tablet by mouth 2 (two) times daily as needed for headache.   Cholecalciferol (VITAMIN D3) 25 MCG (1000 UT) CAPS Take 1 capsule by mouth daily.   Ferrous Sulfate Dried (HIGH POTENCY IRON) 65 MG TABS Take 1 tablet by mouth daily.   hydrOXYzine (ATARAX/VISTARIL) 10 MG tablet Take 5 mg by mouth at bedtime as needed (For sleep).    losartan (COZAAR) 50 MG tablet Take 50 mg by mouth daily.   Magnesium 250 MG TABS Take 1 tablet (250 mg  total) by mouth daily.   Melatonin 10 MG TABS Take 1 tablet by mouth at bedtime.   Multiple Vitamin (MULTIVITAMIN) tablet Take 1 tablet by mouth daily.   oxyCODONE (OXY IR/ROXICODONE) 5 MG immediate release tablet Take 1 tablet (5 mg total) by mouth every 6 (six) hours as needed for breakthrough pain.   polyethylene glycol (MIRALAX) 17 g packet Take 17 g by mouth daily as needed for mild constipation.   pravastatin (PRAVACHOL) 40 MG tablet Take 40 mg by mouth daily.   venlafaxine XR (EFFEXOR-XR) 150 MG 24 hr capsule Take 150 mg by mouth daily with breakfast.   VITAMIN B COMPLEX-C PO Take 1 tablet by mouth daily.   vitamin B-12 (CYANOCOBALAMIN) 1000 MCG tablet Take 1,000 mcg by mouth daily.   pantoprazole (PROTONIX) 40 MG tablet Take 1 tablet (40 mg total) by mouth 2 (two) times daily. (Patient taking differently: Take 40 mg by mouth daily. )   No current facility-administered medications for this visit. (Other)   REVIEW OF SYSTEMS: ROS   Positive for: Eyes, Psychiatric Last edited by Julieanne Cotton, COT on 10/21/2022 12:49 PM.       ALLERGIES Allergies  Allergen Reactions   Azithromycin     Developed a rash right after she quit taking it   PAST MEDICAL HISTORY Past Medical  History:  Diagnosis Date   Abdominal pain, unspecified site    Acute sinusitis, unspecified    Complication of anesthesia    Hard toe wake up from too much medicine   Contact dermatitis and other eczema due to plants (except food)    Cough    Cramp of limb    Depression    Depressive disorder, not elsewhere classified    Essential hypertension, benign    Headache(784.0)    High cholesterol    Hypertension    Lumbago    Migraine    Observation for suspected cardiovascular disease    Osteoporosis, unspecified    Other and unspecified hyperlipidemia    Rash and other nonspecific skin eruption    Rectocele    Reflux esophagitis    Unspecified constipation    Past Surgical History:  Procedure  Laterality Date   APPENDECTOMY     BREAST BIOPSY     Percutaneous needle core   CHOLECYSTECTOMY N/A 05/28/2020   Procedure: LAPAROSCOPIC CHOLECYSTECTOMY;  Surgeon: Gaynelle Adu, MD;  Location: [facility] OR;  Service: General;  Laterality: N/A;   ESOPHAGOGASTRODUODENOSCOPY (EGD) WITH PROPOFOL N/A 03/08/2018   Procedure: ESOPHAGOGASTRODUODENOSCOPY (EGD) WITH PROPOFOL;  Surgeon: Willis Modena, MD;  Location: [facility] ENDOSCOPY;  Service: Endoscopy;  Laterality: N/A;   FAMILY HISTORY Family History  Problem Relation Age of Onset   Hypertension Other    Coronary artery disease Other    Heart disease Mother    ADD / ADHD Son    SOCIAL HISTORY Social History   Tobacco Use   Smoking status: Former    Packs/day: 2.00    Years: 12.00    Additional pack years: 0.00    Total pack years: 24.00    Types: Cigarettes    Quit date: 06/24/1985    Years since quitting: 37.3   Smokeless tobacco: Never  Vaping Use   Vaping Use: Never used  Substance Use Topics   Alcohol use: No   Drug use: No       OPHTHALMIC EXAM:  Base Eye Exam     Visual Acuity (Snellen - Linear)       Right Left   Dist cc 20/40 +2 20/25 +1   Dist ph cc NI     Correction: Glasses         Tonometry (Tonopen, 12:52 PM)       Right Left   Pressure 12 15         Pupils       Dark Light Shape React APD   Right 4 3 Round Brisk None   Left 4 3 Round Brisk None         Visual Fields       Left Right    Full Full         Extraocular Movement       Right Left    Full, Ortho Full, Ortho         Neuro/Psych     Oriented x3: Yes   Mood/Affect: Normal         Dilation     Both eyes: 1.0% Mydriacyl, 2.5% Phenylephrine @ 12:50 PM           Slit Lamp and Fundus Exam     Slit Lamp Exam       Right Left   Lids/Lashes Dermatochalasis - upper lid, mild MGD Dermatochalasis - upper lid, mild MGD   Conjunctiva/Sclera White and quiet White and quiet   Cornea trace PEE,  trace, fine endo pigment,  well healed cataract wound Arcus, trace PEE, trace, fine endo pigment, tear film debris, well healed cataract wound   Anterior Chamber deep and clear deep and clear   Iris Round and dilated Round and dilated   Lens PC IOL in good position with open PC PC IOL in good position with open PC   Anterior Vitreous Vitreous syneresis, Posterior vitreous detachment Vitreous syneresis, Posterior vitreous detachment, vitreous condensations, old VH settled inferiorly         Fundus Exam       Right Left   Disc mild Pallor, Sharp rim, +PPA mild Pallor, Sharp rim, inferior PPA   C/D Ratio 0.2 0.1   Macula Flat, Blunted foveal reflex, RPE mottling and clumping, No heme or edema Flat, Blunted foveal reflex, RPE mottling and clumping, early atrophy, persistent IT IRH -- ?slightly improved   Vessels mild attenuation, mild tortuosity attenuated, Tortuous, vascular loops, +IRH along IT arcades   Periphery Attached, No heme Attached, DBH IT quad           Refraction     Wearing Rx       Sphere Cylinder Axis Add   Right -1.25 +2.50 101 +2.50   Left -1.00 +1.75 100 +2.50           IMAGING AND PROCEDURES  Imaging and Procedures for 10/21/2022  OCT, Retina - OU - Both Eyes       Right Eye Quality was good. Central Foveal Thickness: 300. Progression has been stable. Findings include normal foveal contour, no SRF, intraretinal fluid (Mild ERM, focal cystic changes inferior fovea and temporal macula).   Left Eye Quality was good. Central Foveal Thickness: 294. Progression has been stable. Findings include normal foveal contour, no IRF, no SRF, myopic contour (No CME).   Notes *Images captured and stored on drive  Diagnosis / Impression:  NFP, no IRF/SRF OU OD: Mild ERM, focal cystic changes inferior fovea and temporal macula OS: no CME  Clinical management:  See below  Abbreviations: NFP - Normal foveal profile. CME - cystoid macular edema. PED - pigment epithelial detachment. IRF -  intraretinal fluid. SRF - subretinal fluid. EZ - ellipsoid zone. ERM - epiretinal membrane. ORA - outer retinal atrophy. ORT - outer retinal tubulation. SRHM - subretinal hyper-reflective material. IRHM - intraretinal hyper-reflective material            ASSESSMENT/PLAN:    ICD-10-CM   1. Retinal hemorrhage of left eye  H35.62 OCT, Retina - OU - Both Eyes    2. 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 Essential hypertension  I10     4. Hypertensive retinopathy of both eyes  H35.033 OCT, Retina - OU - Both Eyes    5. Pseudophakia, both eyes  Z96.1      1-4. BRVO with retinal hemorrhages OS  - pt reports recent ED visit (01.27.24) for elevated BP to >200 SBP, >100 DBP  - pt reports memory problems that likely contributed to noncompliance of BP meds - BCVA 20/25 -- stable - exam shows flame hemes and IRH along inferior arcades -- persistent, ?slightly improved - OCT still without CME OS - no ophthalmic intervention recommended at this time, but discussed importance of tight BP control - F/U 4 months, sooner prn -- DFE/OCT/possible injection  5. Pseudophakia OU  - s/p CE/IOL OU (Dr. Cathey Endow)  - IOLs in good position, doing well  - monitor  Ophthalmic Meds  Ordered this visit:  No orders of the defined types were placed in this encounter.    Return in about 4 months (around 02/20/2023) for f/u BRVO OS, DFE, OCT.  There are no Patient Instructions on file for this visit.   Explained the diagnoses, plan, and follow up with the patient and they expressed understanding.  Patient expressed understanding of the importance of proper follow up care.   This document serves as a record of services personally performed by Karie Chimera, MD, PhD. It was created on their behalf by De Blanch, an ophthalmic technician. The creation of this record is the provider's dictation and/or activities during the visit.    Electronically signed by: De Blanch,  OA, 10/21/22  4:40 PM  This document serves as a record of services personally performed by Karie Chimera, MD, PhD. It was created on their behalf by Glee Arvin. Manson Passey, OA an ophthalmic technician. The creation of this record is the provider's dictation and/or activities during the visit.    Electronically signed by: Glee Arvin. Manson Passey, [place] 04.29.2024 4:40 PM  Karie Chimera, M.D., Ph.D. Diseases & Surgery of the Retina and Vitreous [facility]  I have reviewed the above documentation for accuracy and completeness, and I agree with the above. Karie Chimera, M.D., Ph.D. 10/21/22 4:51 PM   Abbreviations: M myopia (nearsighted); A astigmatism; H hyperopia (farsighted); P presbyopia; Mrx spectacle prescription;  CTL contact lenses; OD right eye; OS left eye; OU both eyes  XT exotropia; ET esotropia; PEK punctate epithelial keratitis; PEE punctate epithelial erosions; DES dry eye syndrome; MGD meibomian gland dysfunction; ATs artificial tears; PFAT's preservative free artificial tears; NSC nuclear sclerotic cataract; PSC posterior subcapsular cataract; ERM epi-retinal membrane; PVD posterior vitreous detachment; RD retinal detachment; DM diabetes mellitus; DR diabetic retinopathy; NPDR non-proliferative diabetic retinopathy; PDR proliferative diabetic retinopathy; CSME clinically significant macular edema; DME diabetic macular edema; dbh dot blot hemorrhages; CWS cotton wool spot; POAG primary open angle glaucoma; C/D cup-to-disc ratio; HVF humphrey visual field; GVF goldmann visual field; OCT optical coherence tomography; IOP intraocular pressure; BRVO Branch retinal vein occlusion; CRVO central retinal vein occlusion; CRAO central retinal artery occlusion; BRAO branch retinal artery occlusion; RT retinal tear; SB scleral buckle; PPV pars plana vitrectomy; VH Vitreous hemorrhage; PRP panretinal laser photocoagulation; IVK intravitreal kenalog; VMT vitreomacular traction; MH Macular  hole;  NVD neovascularization of the disc; NVE neovascularization elsewhere; AREDS age related eye disease study; ARMD age related macular degeneration; POAG primary open angle glaucoma; EBMD epithelial/anterior basement membrane dystrophy; ACIOL anterior chamber intraocular lens; IOL intraocular lens; PCIOL posterior chamber intraocular lens; Phaco/IOL phacoemulsification with intraocular lens placement; PRK photorefractive keratectomy; LASIK laser assisted in situ keratomileusis; HTN hypertension; DM diabetes mellitus; COPD chronic obstructive pulmonary disease

## 2022-10-21 ENCOUNTER — Ambulatory Visit (INDEPENDENT_AMBULATORY_CARE_PROVIDER_SITE_OTHER): Payer: Medicare Other | Admitting: Ophthalmology

## 2022-10-21 ENCOUNTER — Encounter (INDEPENDENT_AMBULATORY_CARE_PROVIDER_SITE_OTHER): Payer: Self-pay | Admitting: Ophthalmology

## 2022-10-21 DIAGNOSIS — H35033 Hypertensive retinopathy, bilateral: Secondary | ICD-10-CM | POA: Diagnosis not present

## 2022-10-21 DIAGNOSIS — I10 Essential (primary) hypertension: Secondary | ICD-10-CM

## 2022-10-21 DIAGNOSIS — H3562 Retinal hemorrhage, left eye: Secondary | ICD-10-CM | POA: Diagnosis not present

## 2022-10-21 DIAGNOSIS — H348322 Tributary (branch) retinal vein occlusion, left eye, stable: Secondary | ICD-10-CM | POA: Diagnosis not present

## 2022-10-21 DIAGNOSIS — Z961 Presence of intraocular lens: Secondary | ICD-10-CM

## 2022-12-16 ENCOUNTER — Ambulatory Visit: Payer: Medicare Other | Admitting: Neurology

## 2022-12-16 ENCOUNTER — Encounter: Payer: Self-pay | Admitting: Neurology

## 2022-12-16 ENCOUNTER — Telehealth: Payer: Self-pay | Admitting: Neurology

## 2022-12-16 VITALS — BP 116/71 | HR 71 | Ht 64.0 in | Wt 132.2 lb

## 2022-12-16 DIAGNOSIS — R4189 Other symptoms and signs involving cognitive functions and awareness: Secondary | ICD-10-CM

## 2022-12-16 DIAGNOSIS — G3184 Mild cognitive impairment, so stated: Secondary | ICD-10-CM | POA: Diagnosis not present

## 2022-12-16 DIAGNOSIS — F0390 Unspecified dementia without behavioral disturbance: Secondary | ICD-10-CM

## 2022-12-16 MED ORDER — DONEPEZIL HCL 5 MG PO TABS: 5.0000 mg | ORAL_TABLET | Freq: Every day | ORAL | 3 refills | Status: DC

## 2022-12-16 NOTE — Telephone Encounter (Signed)
Not at this time, lets see what the workup shows thanks

## 2022-12-16 NOTE — Telephone Encounter (Signed)
Pt would like call back to verify the dosage because different dosage than already take of donepezil (ARICEPT) 5 MG tablet

## 2022-12-16 NOTE — Telephone Encounter (Signed)
I returned the patient's call. She states she has been on Donepezil 10 mg daily at bedtime upwards of 1 year and tolerates it fine. She said she had it written on the med list she carries with her in her purse. Patient denies any trouble with her Donepezil at 10 mg, no diarrhea, etc. she states she feels it at [facility] in [place] and it is prescribed by Dr. Andrey Campanile. While I had her on the phone, we went over her entire medication list and updated it. I added Potassium Gluconate 99 mg PO Daily and Pantoprazole 40 mg PO PRN.   I called her pharmacy and found out that she has been on it since 05/07/22. She last filled a 90 day supply on 12/02/22. I asked the pharmacist to go ahead and cancel the 5 mg prescription sent today and that I would see if Dr Lucia Gaskins wants to send in further refills.

## 2022-12-16 NOTE — Telephone Encounter (Signed)
Upon review of chart, this is a brand new Rx for 5 mg at bedtime.

## 2022-12-16 NOTE — Patient Instructions (Addendum)
Repeat MRI brain w/wo contrast Lab test: one for alzheimers gene, another for protein markers of alzheimers in your blood. If positive we will order CT scan to see if you have alzheimer's protein in the brain. Start Aricept - memory medication   Donepezil Tablets What is this medication? DONEPEZIL (doe NEP e zil) treats memory loss and confusion (dementia) in people who have Alzheimer disease. It works by improving attention, memory, and the ability to engage in daily activities. It is not a cure for dementia or Alzheimer disease. This medicine may be used for other purposes; ask your health care provider or pharmacist if you have questions. COMMON BRAND NAME(S): Aricept What should I tell my care team before I take this medication? They need to know if you have any of these conditions: Asthma or other lung disease Difficulty passing urine Head injury Heart disease History of irregular heartbeat Liver disease Seizures (convulsions) Stomach or intestinal disease, ulcers or stomach bleeding An unusual or allergic reaction to donepezil, other medications, foods, dyes, or preservatives Pregnant or trying to get pregnant Breast-feeding How should I use this medication? Take this medication by mouth with a glass of water. Follow the directions on the prescription label. You may take this medication with or without food. Take this medication at regular intervals. This medication is usually taken before bedtime. Do not take it more often than directed. Continue to take your medication even if you feel better. Do not stop taking except on your care team's advice. If you are taking the 23 mg donepezil tablet, swallow it whole; do not cut, crush, or chew it. Talk to your care team about the use of this medication in children. Special care may be needed. Overdosage: If you think you have taken too much of this medicine contact a poison control center or emergency room at once. NOTE: This medicine is  only for you. Do not share this medicine with others. What if I miss a dose? If you miss a dose, take it as soon as you can. If it is almost time for your next dose, take only that dose, do not take double or extra doses. What may interact with this medication? Do not take this medication with any of the following: Certain medications for fungal infections like itraconazole, fluconazole, posaconazole, and voriconazole Cisapride Dextromethorphan; quinidine Dronedarone Pimozide Quinidine Thioridazine This medication may also interact with the following: Antihistamines for allergy, cough and cold Atropine Bethanechol Carbamazepine Certain medications for bladder problems like oxybutynin, tolterodine Certain medications for Parkinson's disease like benztropine, trihexyphenidyl Certain medications for stomach problems like dicyclomine, hyoscyamine Certain medications for travel sickness like scopolamine Dexamethasone Dofetilide Ipratropium NSAIDs, medications for pain and inflammation, like ibuprofen or naproxen Other medications for Alzheimer's disease Other medications that prolong the QT interval (cause an abnormal heart rhythm) Phenobarbital Phenytoin Rifampin, rifabutin or rifapentine Ziprasidone This list may not describe all possible interactions. Give your health care provider a list of all the medicines, herbs, non-prescription drugs, or dietary supplements you use. Also tell them if you smoke, drink alcohol, or use illegal drugs. Some items may interact with your medicine. What should I watch for while using this medication? Visit your care team for regular checks on your progress. Check with your care team if your symptoms do not get better or if they get worse. You may get drowsy or dizzy. Do not drive, use machinery, or do anything that needs mental alertness until you know how this medication affects you. What side effects  may I notice from receiving this medication? Side  effects that you should report to your care team as soon as possible: Allergic reactions--skin rash, itching, hives, swelling of the face, lips, tongue, or throat Peptic ulcer--burning stomach pain, loss of appetite, bloating, burping, heartburn, nausea, vomiting Seizures Slow heartbeat--dizziness, feeling faint or lightheaded, confusion, trouble breathing, unusual weakness or fatigue Stomach bleeding--bloody or black, tar-like stools, vomiting blood or brown material that looks like coffee grounds Trouble passing urine Side effects that usually do not require medical attention (report these to your care team if they continue or are bothersome): Diarrhea Fatigue Loss of appetite Muscle pain or cramps Nausea Trouble sleeping This list may not describe all possible side effects. Call your doctor for medical advice about side effects. You may report side effects to FDA at 1-800-FDA-1088. Where should I keep my medication? Keep out of reach of children. Store at room temperature between 15 and 30 degrees C (59 and 86 degrees F). Throw away any unused medication after the expiration date. NOTE: This sheet is a summary. It may not cover all possible information. If you have questions about this medicine, talk to your doctor, pharmacist, or health care provider.  2024 Elsevier/Gold Standard (2021-01-24 00:00:00)
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
## 2022-12-17 ENCOUNTER — Telehealth: Payer: Self-pay | Admitting: Neurology

## 2022-12-17 ENCOUNTER — Encounter: Payer: Self-pay | Admitting: Neurology

## 2022-12-17 NOTE — Telephone Encounter (Signed)
I called the patient and let her know that at this time Dr Lucia Gaskins does not want to change pt's medication but see what the workup shows. I advised her to continue taking Donepezil 10 mg daily. The patient verbalized understanding and appreciation for the call.

## 2022-12-17 NOTE — Telephone Encounter (Signed)
[employer] medicare Berkley Harvey: [id number] exp. 12/17/22-01/15/23 sent to GI [phone]

## 2022-12-28 LAB — APOE ALZHEIMER'S RISK

## 2022-12-28 LAB — ATN PROFILE
A -- Beta-amyloid 42/40 Ratio: 0.096 — ABNORMAL LOW (ref >0.102)
Beta-amyloid 40: 178.35 pg/mL
Beta-amyloid 42: 17.08 pg/mL
N -- NfL, Plasma: 4.44 pg/mL (ref 0.00–11.55)
T -- p-tau181: 1.71 pg/mL — ABNORMAL HIGH (ref 0.00–0.97)

## 2022-12-31 ENCOUNTER — Telehealth: Payer: Self-pay

## 2022-12-31 NOTE — Telephone Encounter (Signed)
Pt called back. Requesting a call back from nurse.  

## 2022-12-31 NOTE — Telephone Encounter (Signed)
-----   Message from Anson Fret, MD sent at 12/30/2022  5:06 PM EDT ----- Please call family and let them know thanks: You do NOT have an alzheimer's gene which is great. However we did see some proteins in the blood that are consistent with alzheimer's pathology. As we discussed, this is not concrete proof of alzheimers it just portends increased risk at this time; we still are waiting for the MRI of the brain and at this time I could order a scan to see if there is amyloid in the brain which is a hallmark of alzheimers. Please let me know what they say and I can order PET Amyloid Scan as well

## 2022-12-31 NOTE — Telephone Encounter (Signed)
I called patient. I discussed her labs and recommendations. She is agreeable to this plan and would like to order the PET scan. I also gave her the phone number for GI to schedule her MRI. She asked me to call her friend, Alona Bene, per DPR, and explain this to her as well. I called Alona Bene and discussed patient's labs and recommendations. Both patient and Alona Bene verbalized understanding of labs and recommendations.

## 2022-12-31 NOTE — Telephone Encounter (Signed)
I called patient to discuss. No answer, left a message asking her to call us back. Please route to POD 4.

## 2023-01-02 ENCOUNTER — Telehealth: Payer: Self-pay | Admitting: Neurology

## 2023-01-02 NOTE — Addendum Note (Signed)
Addended by: Naomie Dean B on: 01/02/2023 09:25 AM   Modules accepted: Orders

## 2023-01-02 NOTE — Telephone Encounter (Signed)
Completed. thanks

## 2023-01-02 NOTE — Telephone Encounter (Signed)
[employer] medicare Berkley Harvey: [id number] exp. 01/02/23-01/31/23 sent to [facility] [phone]

## 2023-01-15 ENCOUNTER — Other Ambulatory Visit: Payer: Medicare Other

## 2023-01-24 ENCOUNTER — Encounter (HOSPITAL_COMMUNITY)
Admission: RE | Admit: 2023-01-24 | Discharge: 2023-01-24 | Disposition: A | Discharge status: Home / Self Care | Payer: Medicare Other | Source: Ambulatory Visit | Attending: Neurology | Admitting: Neurology

## 2023-01-24 DIAGNOSIS — F0390 Unspecified dementia without behavioral disturbance: Secondary | ICD-10-CM

## 2023-01-24 DIAGNOSIS — R4189 Other symptoms and signs involving cognitive functions and awareness: Secondary | ICD-10-CM

## 2023-01-24 DIAGNOSIS — G3184 Mild cognitive impairment, so stated: Secondary | ICD-10-CM | POA: Diagnosis present

## 2023-01-24 MED ORDER — FLORBETAPIR F 18 500-1900 MBQ/ML IV SOLN
10.0000 | Freq: Once | INTRAVENOUS | Status: AC
  Administered 2023-01-24: 9.4 via INTRAVENOUS

## 2023-02-06 ENCOUNTER — Other Ambulatory Visit: Payer: Self-pay | Admitting: Family Medicine

## 2023-02-06 DIAGNOSIS — E7800 Pure hypercholesterolemia, unspecified: Secondary | ICD-10-CM

## 2023-02-06 DIAGNOSIS — I6522 Occlusion and stenosis of left carotid artery: Secondary | ICD-10-CM

## 2023-02-07 ENCOUNTER — Other Ambulatory Visit: Payer: Self-pay | Admitting: Family Medicine

## 2023-02-07 DIAGNOSIS — M810 Age-related osteoporosis without current pathological fracture: Secondary | ICD-10-CM

## 2023-02-12 NOTE — Progress Notes (Signed)
[facility] - Clinic Note  02/17/2023     CHIEF COMPLAINT Patient presents for Retina Follow Up   HISTORY OF PRESENT ILLNESS: Phyllis [name] is a 82 y.o. female who presents to the clinic today for:   HPI     Retina Follow Up   Patient presents with  CRVO/BRVO.  In left eye.  This started 4 months ago.  Duration of 4 months.  Since onset it is stable.  I, the attending physician,  performed the HPI with the patient and updated documentation appropriately.        Comments   4 month retina follow up BRVO OS and possible injection pt is reporting vision is not as sharp at near she denies any flashes has had some floaters       Last edited by Rennis Chris, MD on 02/20/2023  1:05 AM.     Pt states she has started using a magnifying glass when she reads  Referring physician: Sinda Du, MD [address] [place],  [place] [address]  HISTORICAL INFORMATION:   Selected notes from the MEDICAL RECORD NUMBER Referred by Dr. Cathey Endow for BRVO OS LEE:  Ocular Hx- PMH-    CURRENT MEDICATIONS: No current outpatient medications on file. (Ophthalmic Drugs)   No current facility-administered medications for this visit. (Ophthalmic Drugs)   Current Outpatient Medications (Other)  Medication Sig   acetaminophen (TYLENOL) 325 MG tablet Take 2 tablets (650 mg total) by mouth every 6 (six) hours as needed for mild pain, moderate pain or fever.   alendronate (FOSAMAX) 70 MG tablet Take 70 mg by mouth every Saturday.   Ascorbic Acid (VITAMIN C) 100 MG tablet Take 100 mg by mouth daily.   butalbital-acetaminophen-caffeine (FIORICET) 50-325-40 MG tablet Take 1 tablet by mouth 2 (two) times daily as needed for headache.   Cholecalciferol (VITAMIN D3) 25 MCG (1000 UT) CAPS Take 1 capsule by mouth daily.   donepezil (ARICEPT) 10 MG tablet Take 10 mg by mouth at bedtime.   Ferrous Sulfate Dried (HIGH POTENCY IRON) 65 MG TABS Take 1 tablet by mouth daily.   hydrOXYzine  (ATARAX/VISTARIL) 10 MG tablet Take 5 mg by mouth at bedtime as needed (For sleep).    losartan (COZAAR) 50 MG tablet Take 50 mg by mouth daily.   Magnesium 250 MG TABS Take 1 tablet (250 mg total) by mouth daily.   Melatonin 10 MG TABS Take 1 tablet by mouth at bedtime.   Multiple Vitamin (MULTIVITAMIN) tablet Take 1 tablet by mouth daily.   pantoprazole (PROTONIX) 40 MG tablet Take 40 mg by mouth as needed.   potassium gluconate 595 (99 K) MG TABS tablet Take 1 tablet by mouth daily.   pravastatin (PRAVACHOL) 40 MG tablet Take 40 mg by mouth daily.   Red Yeast Rice Extract (RED YEAST RICE PO) Take by mouth.   Turmeric (QC TUMERIC COMPLEX PO) Take 400 mg by mouth.   venlafaxine XR (EFFEXOR-XR) 150 MG 24 hr capsule Take 150 mg by mouth daily with breakfast.   VITAMIN B COMPLEX-C PO Take 1 tablet by mouth daily.   vitamin B-12 (CYANOCOBALAMIN) 1000 MCG tablet Take 1,000 mcg by mouth daily.   No current facility-administered medications for this visit. (Other)   REVIEW OF SYSTEMS: ROS   Positive for: Eyes, Psychiatric Last edited by Etheleen Mayhew, COT on 02/17/2023  1:25 PM.        ALLERGIES Allergies  Allergen Reactions   Azithromycin  Developed a rash right after she quit taking it   PAST MEDICAL HISTORY Past Medical History:  Diagnosis Date   Abdominal pain, unspecified site    Acute sinusitis, unspecified    Complication of anesthesia    Hard toe wake up from too much medicine   Contact dermatitis and other eczema due to plants (except food)    Cough    Cramp of limb    Depression    Depressive disorder, not elsewhere classified    Essential hypertension, benign    Headache(784.0)    High cholesterol    Hypertension    Lumbago    Migraine    Observation for suspected cardiovascular disease    Osteoporosis, unspecified    Other and unspecified hyperlipidemia    Rash and other nonspecific skin eruption    Rectocele    Reflux esophagitis    Unspecified  constipation    Past Surgical History:  Procedure Laterality Date   APPENDECTOMY     BREAST BIOPSY     Percutaneous needle core   CHOLECYSTECTOMY N/A 05/28/2020   Procedure: LAPAROSCOPIC CHOLECYSTECTOMY;  Surgeon: Gaynelle Adu, MD;  Location: [facility] OR;  Service: General;  Laterality: N/A;   ESOPHAGOGASTRODUODENOSCOPY (EGD) WITH PROPOFOL N/A 03/08/2018   Procedure: ESOPHAGOGASTRODUODENOSCOPY (EGD) WITH PROPOFOL;  Surgeon: Willis Modena, MD;  Location: [facility] ENDOSCOPY;  Service: Endoscopy;  Laterality: N/A;   FAMILY HISTORY Family History  Problem Relation Age of Onset   Heart disease Mother    ADD / ADHD Son    Hypertension Other    Coronary artery disease Other    Alzheimer's disease Neg Hx    Dementia Neg Hx    SOCIAL HISTORY Social History   Tobacco Use   Smoking status: Former    Current packs/day: 0.00    Average packs/day: 2.0 packs/day for 12.0 years (24.0 ttl pk-yrs)    Types: Cigarettes    Start date: 06/24/1973    Quit date: 06/24/1985    Years since quitting: 37.6   Smokeless tobacco: Never  Vaping Use   Vaping status: Never Used  Substance Use Topics   Alcohol use: No   Drug use: No       OPHTHALMIC EXAM:  Base Eye Exam     Visual Acuity (Snellen - Linear)       Right Left   Dist cc 20/40 20/25 -2   Dist ph cc NI NI    Correction: Glasses         Tonometry (Tonopen, 1:29 PM)       Right Left   Pressure 14 14         Pupils       Pupils Dark Light Shape React APD   Right PERRL 4 3 Round Brisk None   Left PERRL 4 3 Round Brisk None         Visual Fields       Left Right    Full Full         Extraocular Movement       Right Left    Full, Ortho Full, Ortho         Neuro/Psych     Oriented x3: Yes   Mood/Affect: Normal         Dilation     Both eyes: 2.5% Phenylephrine @ 1:29 PM           Slit Lamp and Fundus Exam     Slit Lamp Exam       Right  Left   Lids/Lashes Dermatochalasis - upper lid Dermatochalasis -  upper lid   Conjunctiva/Sclera White and quiet White and quiet   Cornea mild arcus, tear film debris, trace PEE, well healed cataract wound Arcus, trace PEE, trace, fine endo pigment, tear film debris, well healed cataract wound   Anterior Chamber deep and clear deep and clear   Iris Round and moderately dilated Round and moderately dilated   Lens PC IOL in good position with open PC PC IOL in good position with open PC   Anterior Vitreous mild syneresis, Posterior vitreous detachment Vitreous syneresis, Posterior vitreous detachment, vitreous condensations, old VH settled inferiorly         Fundus Exam       Right Left   Disc mild Pallor, Sharp rim, mild PPA mild Pallor, Sharp rim, inferior PPA   C/D Ratio 0.2 0.1   Macula Flat, Blunted foveal reflex, RPE mottling and clumping, No heme or edema Flat, Blunted foveal reflex, RPE mottling and clumping, early atrophy, persistent IT punctate IRH -- ?slightly improved   Vessels attenuated, mild tortuosity attenuated, Tortuous, vascular loops, +IRH along IT arcades -- improved   Periphery Attached, No heme Attached, DBH IT quad           Refraction     Wearing Rx       Sphere Cylinder Axis Add   Right -1.25 +2.50 101 +2.50   Left -1.00 +1.75 100 +2.50           IMAGING AND PROCEDURES  Imaging and Procedures for 02/17/2023  OCT, Retina - OU - Both Eyes       Right Eye Quality was good. Central Foveal Thickness: 300. Progression has improved. Findings include normal foveal contour, no SRF, intraretinal fluid (Mild ERM, focal cystic changes inferior fovea and temporal macula -- slightly improved).   Left Eye Quality was good. Central Foveal Thickness: 293. Progression has been stable. Findings include normal foveal contour, no IRF, no SRF, myopic contour (No CME).   Notes *Images captured and stored on drive  Diagnosis / Impression:  NFP, no IRF/SRF OU OD: Mild ERM, focal cystic changes inferior fovea and temporal macula  -- slightly improved OS: no CME  Clinical management:  See below  Abbreviations: NFP - Normal foveal profile. CME - cystoid macular edema. PED - pigment epithelial detachment. IRF - intraretinal fluid. SRF - subretinal fluid. EZ - ellipsoid zone. ERM - epiretinal membrane. ORA - outer retinal atrophy. ORT - outer retinal tubulation. SRHM - subretinal hyper-reflective material. IRHM - intraretinal hyper-reflective material            ASSESSMENT/PLAN:    ICD-10-CM   1. Retinal hemorrhage of left eye  H35.62 OCT, Retina - OU - Both Eyes    2. 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 Essential hypertension  I10     4. Hypertensive retinopathy of both eyes  H35.033     5. Pseudophakia, both eyes  Z96.1      1-4. BRVO with retinal hemorrhages OS  - pt reports ED visit (01.27.24) for elevated BP to >200 SBP, >100 DBP  - pt reports memory problems that likely contributed to noncompliance of BP meds - BCVA 20/25 -- stable - exam shows flame hemes and IRH along inferior arcades -- persistent / slightly improved - OCT still without CME OS - no ophthalmic intervention recommended at this time, but discussed importance of  tight BP control  - f/u 4-6 months, sooner prn, DFE, possible injection  5. Pseudophakia OU  - s/p CE/IOL OU (Dr. Cathey Endow)  - IOLs in good position, doing well  - monitor  Ophthalmic Meds Ordered this visit:  No orders of the defined types were placed in this encounter.    Return for f/u 4-6 months, BRVO OS, DFE, OCT.  There are no Patient Instructions on file for this visit.   Explained the diagnoses, plan, and follow up with the patient and they expressed understanding.  Patient expressed understanding of the importance of proper follow up care.   This document serves as a record of services personally performed by Karie Chimera, MD, PhD. It was created on their behalf by Annalee Genta, COMT. The creation  of this record is the provider's dictation and/or activities during the visit.  Electronically signed by: Annalee Genta, COMT 02/20/23 1:06 AM  This document serves as a record of services personally performed by Karie Chimera, MD, PhD. It was created on their behalf by Glee Arvin. Manson Passey, OA an ophthalmic technician. The creation of this record is the provider's dictation and/or activities during the visit.    Electronically signed by: Glee Arvin. Manson Passey, OA 02/20/23 1:06 AM  Karie Chimera, M.D., Ph.D. Diseases & Surgery of the Retina and Vitreous [facility]  I have reviewed the above documentation for accuracy and completeness, and I agree with the above. Karie Chimera, M.D., Ph.D. 02/20/23 1:08 AM    Abbreviations: M myopia (nearsighted); A astigmatism; H hyperopia (farsighted); P presbyopia; Mrx spectacle prescription;  CTL contact lenses; OD right eye; OS left eye; OU both eyes  XT exotropia; ET esotropia; PEK punctate epithelial keratitis; PEE punctate epithelial erosions; DES dry eye syndrome; MGD meibomian gland dysfunction; ATs artificial tears; PFAT's preservative free artificial tears; NSC nuclear sclerotic cataract; PSC posterior subcapsular cataract; ERM epi-retinal membrane; PVD posterior vitreous detachment; RD retinal detachment; DM diabetes mellitus; DR diabetic retinopathy; NPDR non-proliferative diabetic retinopathy; PDR proliferative diabetic retinopathy; CSME clinically significant macular edema; DME diabetic macular edema; dbh dot blot hemorrhages; CWS cotton wool spot; POAG primary open angle glaucoma; C/D cup-to-disc ratio; HVF humphrey visual field; GVF goldmann visual field; OCT optical coherence tomography; IOP intraocular pressure; BRVO Branch retinal vein occlusion; CRVO central retinal vein occlusion; CRAO central retinal artery occlusion; BRAO branch retinal artery occlusion; RT retinal tear; SB scleral buckle; PPV pars plana vitrectomy; VH  Vitreous hemorrhage; PRP panretinal laser photocoagulation; IVK intravitreal kenalog; VMT vitreomacular traction; MH Macular hole;  NVD neovascularization of the disc; NVE neovascularization elsewhere; AREDS age related eye disease study; ARMD age related macular degeneration; POAG primary open angle glaucoma; EBMD epithelial/anterior basement membrane dystrophy; ACIOL anterior chamber intraocular lens; IOL intraocular lens; PCIOL posterior chamber intraocular lens; Phaco/IOL phacoemulsification with intraocular lens placement; PRK photorefractive keratectomy; LASIK laser assisted in situ keratomileusis; HTN hypertension; DM diabetes mellitus; COPD chronic obstructive pulmonary disease

## 2023-02-17 ENCOUNTER — Ambulatory Visit (INDEPENDENT_AMBULATORY_CARE_PROVIDER_SITE_OTHER): Payer: Medicare Other | Admitting: Ophthalmology

## 2023-02-17 ENCOUNTER — Encounter (INDEPENDENT_AMBULATORY_CARE_PROVIDER_SITE_OTHER): Payer: Self-pay | Admitting: Ophthalmology

## 2023-02-17 DIAGNOSIS — I10 Essential (primary) hypertension: Secondary | ICD-10-CM | POA: Diagnosis not present

## 2023-02-17 DIAGNOSIS — Z961 Presence of intraocular lens: Secondary | ICD-10-CM

## 2023-02-17 DIAGNOSIS — H3562 Retinal hemorrhage, left eye: Secondary | ICD-10-CM | POA: Diagnosis not present

## 2023-02-17 DIAGNOSIS — H35033 Hypertensive retinopathy, bilateral: Secondary | ICD-10-CM

## 2023-02-17 DIAGNOSIS — H348322 Tributary (branch) retinal vein occlusion, left eye, stable: Secondary | ICD-10-CM

## 2023-02-18 ENCOUNTER — Ambulatory Visit
Admission: RE | Admit: 2023-02-18 | Discharge: 2023-02-18 | Disposition: A | Discharge status: Home / Self Care | Payer: Medicare Other | Source: Ambulatory Visit | Attending: Family Medicine | Admitting: Family Medicine

## 2023-02-18 DIAGNOSIS — E7800 Pure hypercholesterolemia, unspecified: Secondary | ICD-10-CM

## 2023-02-18 DIAGNOSIS — I6522 Occlusion and stenosis of left carotid artery: Secondary | ICD-10-CM

## 2023-02-19 ENCOUNTER — Telehealth: Payer: Self-pay

## 2023-02-19 NOTE — Telephone Encounter (Signed)
1st attempt lvm

## 2023-02-19 NOTE — Telephone Encounter (Signed)
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 We can discuss at follow up appointment.

## 2023-02-20 ENCOUNTER — Encounter (INDEPENDENT_AMBULATORY_CARE_PROVIDER_SITE_OTHER): Payer: Self-pay | Admitting: Ophthalmology

## 2023-02-20 NOTE — Telephone Encounter (Signed)
I spoke with patient's friend Juliann Pulse (on DPR) and discussed the results of the PET scan.  She verbalized understanding and was tearful.  She states she will speak with the patient about this and they will see [place] at the follow-up appointment to discuss more details on September 16 at 2:00 PM arrival 130.  She verbalized appreciation for the call. Emotional support provided to her.

## 2023-02-25 ENCOUNTER — Encounter: Payer: Self-pay | Admitting: Neurology

## 2023-03-05 ENCOUNTER — Ambulatory Visit
Admission: RE | Admit: 2023-03-05 | Discharge: 2023-03-05 | Disposition: A | Discharge status: Home / Self Care | Payer: Medicare Other | Source: Ambulatory Visit | Attending: Neurology | Admitting: Neurology

## 2023-03-05 DIAGNOSIS — G3184 Mild cognitive impairment, so stated: Secondary | ICD-10-CM | POA: Diagnosis not present

## 2023-03-05 DIAGNOSIS — F0390 Unspecified dementia without behavioral disturbance: Secondary | ICD-10-CM

## 2023-03-05 DIAGNOSIS — R4189 Other symptoms and signs involving cognitive functions and awareness: Secondary | ICD-10-CM

## 2023-03-05 MED ORDER — GADOPICLENOL 0.5 MMOL/ML IV SOLN
6.0000 mL | Freq: Once | INTRAVENOUS | Status: AC | PRN
  Administered 2023-03-05: 6 mL via INTRAVENOUS

## 2023-03-10 ENCOUNTER — Encounter: Payer: Self-pay | Admitting: Neurology

## 2023-03-10 ENCOUNTER — Ambulatory Visit: Payer: Medicare Other | Admitting: Neurology

## 2023-03-10 VITALS — BP 123/68 | HR 92 | Ht 64.0 in | Wt 136.0 lb

## 2023-03-10 DIAGNOSIS — F0390 Unspecified dementia without behavioral disturbance: Secondary | ICD-10-CM

## 2023-03-10 DIAGNOSIS — R4189 Other symptoms and signs involving cognitive functions and awareness: Secondary | ICD-10-CM

## 2023-03-10 DIAGNOSIS — G3184 Mild cognitive impairment, so stated: Secondary | ICD-10-CM | POA: Diagnosis not present

## 2023-03-10 MED ORDER — MEMANTINE HCL 10 MG PO TABS: ORAL_TABLET | ORAL | 11 refills | Status: DC
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
## 2023-03-10 NOTE — Patient Instructions (Addendum)
Continue Aricept/Donepezil Start Memantine/Namenda RTC 6 months Discussed lecanemab  Memantine Tablets What is this medication? MEMANTINE (MEM an teen) treats memory loss and confusion (dementia) in people who have Alzheimer disease. It works by improving attention, memory, and the ability to engage in daily activities. It is not a cure for dementia or Alzheimer disease. This medicine may be used for other purposes; ask your health care provider or pharmacist if you have questions. COMMON BRAND NAME(S): Namenda What should I tell my care team before I take this medication? They need to know if you have any of these conditions: Kidney disease Liver disease Seizures Trouble passing urine An unusual or allergic reaction to memantine, other medications, foods, dyes, or preservatives Pregnant or trying to get pregnant Breast-feeding How should I use this medication? Take this medication by mouth with water. Follow the directions on the prescription label. You may take this medication with or without food. Take your doses at regular intervals. Do not take your medication more often than directed. Continue to take your medication even if you feel better. Do not stop taking except on the advice of your care team. Talk to your care team about the use of this medication in children. Special care may be needed Overdosage: If you think you have taken too much of this medicine contact a poison control center or emergency room at once. NOTE: This medicine is only for you. Do not share this medicine with others. What if I miss a dose? If you miss a dose, take it as soon as you can. If it is almost time for your next dose, take only that dose. Do not take double or extra doses. If you do not take your medication for several days, contact your care team. Your dose may need to be changed. What may interact with this  medication? Acetazolamide Amantadine Cimetidine Dextromethorphan Dofetilide Hydrochlorothiazide Ketamine Metformin Methazolamide Quinidine Ranitidine Sodium bicarbonate Triamterene This list may not describe all possible interactions. Give your health care provider a list of all the medicines, herbs, non-prescription drugs, or dietary supplements you use. Also tell them if you smoke, drink alcohol, or use illegal drugs. Some items may interact with your medicine. What should I watch for while using this medication? Visit your care team for regular checks on your progress. Check with your care team if there is no improvement in your symptoms or if they get worse. This medication may affect your coordination, reaction time, or judgment. Do not drive or operate machinery until you know how this medication affects you. Sit up or stand slowly to reduce the risk of dizzy or fainting spells. Drinking alcohol with this medication can increase the risk of these side effects. What side effects may I notice from receiving this medication? Side effects that you should report to your care team as soon as possible: Allergic reactions--skin rash, itching, hives, swelling of the face, lips, tongue, or throat Side effects that usually do not require medical attention (report to your care team if they continue or are bothersome): Confusion Constipation Diarrhea Dizziness Headache This list may not describe all possible side effects. Call your doctor for medical advice about side effects. You may report side effects to FDA at 1-800-FDA-1088. Where should I keep my medication? Keep out of the reach of children. Store at room temperature between 15 degrees and 30 degrees C (59 degrees and 86 degrees F). Throw away any unused medication after the expiration date. NOTE: This sheet is a summary. It may  not cover all possible information. If you have questions about this medicine, talk to your doctor,  pharmacist, or health care provider.  2024 Elsevier/Gold Standard (2021-07-03 00:00:00)  Lecanemab Injection What is this medication? LECANEMAB (lek AN e mab) treats Alzheimer disease. It works by decreasing the buildup of amyloid, a protein that may cause Alzheimer disease. This may slow down the worsening of symptoms. It is a monoclonal antibody. This medicine may be used for other purposes; ask your health care provider or pharmacist if you have questions. COMMON BRAND NAME(S): LEQEMBI What should I tell my care team before I take this medication? They need to know if you have any of these conditions: An unusual or allergic reaction to lecanemab, other medications, foods, dyes, or preservatives Take medications that treat or prevent blood clots Pregnant or trying to get pregnant Breast-feeding How should I use this medication? This medication is injected into a vein. It is given by your care team in a hospital or clinic setting. A special MedGuide will be given to you before each treatment. Be sure to read this information carefully each time. Talk to your care team about the use of this medication in children. Special care may be needed. Overdosage: If you think you have taken too much of this medicine contact a poison control center or emergency room at once. NOTE: This medicine is only for you. Do not share this medicine with others. What if I miss a dose? Keep appointments for follow-up doses. It is important not to miss your dose. Call your care team if you are unable to keep an appointment. What may interact with this medication? Interactions have not been studied. This list may not describe all possible interactions. Give your health care provider a list of all the medicines, herbs, non-prescription drugs, or dietary supplements you use. Also tell them if you smoke, drink alcohol, or use illegal drugs. Some items may interact with your medicine. What should I watch for while using  this medication? Visit your care team for regular checks on your progress. Tell your care team if your symptoms do not start to get better or if they get worse. What side effects may I notice from receiving this medication? Side effects that you should report to your care team as soon as possible: Allergic reactions or angioedema--skin rash, itching or hives, swelling of the face, eyes, lips, tongue, arms, or legs, trouble swallowing or breathing Headache, worsening confusion, dizziness, change in vision, nausea, seizures Infusion reactions--chest pain, shortness of breath or trouble breathing, feeling faint or lightheaded Side effects that usually do not require medical attention (report these to your care team if they continue or are bothersome): Cough Diarrhea Headache This list may not describe all possible side effects. Call your doctor for medical advice about side effects. You may report side effects to FDA at 1-800-FDA-1088. Where should I keep my medication? This medication is given in a hospital or clinic. It will not be stored at home. NOTE: This sheet is a summary. It may not cover all possible information. If you have questions about this medicine, talk to your doctor, pharmacist, or health care provider.  2024 Elsevier/Gold Standard (2022-01-01 00:00:00)

## 2023-03-19 ENCOUNTER — Ambulatory Visit: Payer: Medicare Other | Admitting: Neurology

## 2023-03-24 ENCOUNTER — Ambulatory Visit: Payer: Medicare Other | Admitting: Neurology

## 2023-04-14 ENCOUNTER — Emergency Department (HOSPITAL_COMMUNITY): Payer: Medicare Other

## 2023-04-14 ENCOUNTER — Emergency Department (HOSPITAL_COMMUNITY)
Admission: EM | Admit: 2023-04-14 | Discharge: 2023-04-14 | Disposition: A | Discharge status: Home / Self Care | Payer: Medicare Other | Attending: Emergency Medicine | Admitting: Emergency Medicine

## 2023-04-14 DIAGNOSIS — R6883 Chills (without fever): Secondary | ICD-10-CM | POA: Diagnosis not present

## 2023-04-14 DIAGNOSIS — R531 Weakness: Secondary | ICD-10-CM | POA: Insufficient documentation

## 2023-04-14 DIAGNOSIS — Z20822 Contact with and (suspected) exposure to covid-19: Secondary | ICD-10-CM | POA: Insufficient documentation

## 2023-04-14 DIAGNOSIS — R197 Diarrhea, unspecified: Secondary | ICD-10-CM | POA: Diagnosis not present

## 2023-04-14 DIAGNOSIS — B349 Viral infection, unspecified: Secondary | ICD-10-CM

## 2023-04-14 LAB — COMPREHENSIVE METABOLIC PANEL
ALT: 36 U/L (ref 0–44)
AST: 47 U/L — ABNORMAL HIGH (ref 15–41)
Albumin: 3.8 g/dL (ref 3.5–5.0)
Alkaline Phosphatase: 55 U/L (ref 38–126)
Anion gap: 15 (ref 5–15)
BUN: 19 mg/dL (ref 8–23)
CO2: 19 mmol/L — ABNORMAL LOW (ref 22–32)
Calcium: 9.3 mg/dL (ref 8.9–10.3)
Chloride: 100 mmol/L (ref 98–111)
Creatinine, Ser: 0.66 mg/dL (ref 0.44–1.00)
GFR, Estimated: >60 mL/min (ref >60)
Glucose, Bld: 85 mg/dL (ref 70–99)
Potassium: 3.6 mmol/L (ref 3.5–5.1)
Sodium: 134 mmol/L — ABNORMAL LOW (ref 135–145)
Total Bilirubin: 1.1 mg/dL (ref 0.3–1.2)
Total Protein: 7.4 g/dL (ref 6.5–8.1)

## 2023-04-14 LAB — URINALYSIS, W/ REFLEX TO CULTURE (INFECTION SUSPECTED)
Bilirubin Urine: NEGATIVE
Glucose, UA: NEGATIVE mg/dL
Ketones, ur: 20 mg/dL — AB
Leukocytes,Ua: NEGATIVE
Nitrite: NEGATIVE
Protein, ur: NEGATIVE mg/dL
Specific Gravity, Urine: 1.005 (ref 1.005–1.030)
pH: 5 (ref 5.0–8.0)

## 2023-04-14 LAB — CBC WITH DIFFERENTIAL/PLATELET
Abs Immature Granulocytes: 0.03 10*3/uL (ref 0.00–0.07)
Basophils Absolute: 0 10*3/uL (ref 0.0–0.1)
Basophils Relative: 0 %
Eosinophils Absolute: 0.1 10*3/uL (ref 0.0–0.5)
Eosinophils Relative: 1 %
HCT: 41.1 % (ref 36.0–46.0)
Hemoglobin: 14 g/dL (ref 12.0–15.0)
Immature Granulocytes: 1 %
Lymphocytes Relative: 16 %
Lymphs Abs: 1 10*3/uL (ref 0.7–4.0)
MCH: 32.9 pg (ref 26.0–34.0)
MCHC: 34.1 g/dL (ref 30.0–36.0)
MCV: 96.7 fL (ref 80.0–100.0)
Monocytes Absolute: 0.7 10*3/uL (ref 0.1–1.0)
Monocytes Relative: 11 %
Neutro Abs: 4.5 10*3/uL (ref 1.7–7.7)
Neutrophils Relative %: 71 %
Platelets: 222 10*3/uL (ref 150–400)
RBC: 4.25 MIL/uL (ref 3.87–5.11)
RDW: 12 % (ref 11.5–15.5)
WBC: 6.3 10*3/uL (ref 4.0–10.5)
nRBC: 0 % (ref 0.0–0.2)

## 2023-04-14 LAB — I-STAT CG4 LACTIC ACID, ED: Lactic Acid, Venous: 1.9 mmol/L (ref 0.5–1.9)

## 2023-04-14 LAB — SARS CORONAVIRUS 2 BY RT PCR: SARS Coronavirus 2 by RT PCR: NEGATIVE

## 2023-04-14 NOTE — ED Provider Notes (Signed)
[place] EMERGENCY DEPARTMENT AT [facility] Provider Note   CSN: [id number] Arrival date & time: 04/14/23  6578     History  No chief complaint on file.   Phyllis [name] is a 82 y.o. female.  82 year old female presents with weakness diarrhea times several days.  Patient concerned that she may have COVID.  Some respiratory symptoms noted.  Denies any emesis.  No abdominal discomfort.  States diarrhea has been watery in nature.  Called EMS due to increasing weakness with associated chills.  No temperature was taken at that time.  Sepsis protocol was started and patient given 800 cc of saline and transported here.       Home Medications Prior to Admission medications   Medication Sig Start Date End Date Taking? Authorizing Provider  acetaminophen (TYLENOL) 325 MG tablet Take 2 tablets (650 mg total) by mouth every 6 (six) hours as needed for mild pain, moderate pain or fever. 05/31/20   Hongalgi, Maximino Greenland, MD  alendronate (FOSAMAX) 70 MG tablet Take 70 mg by mouth every Saturday. 01/12/18   [provider]  Ascorbic Acid (VITAMIN C) 100 MG tablet Take 100 mg by mouth daily.    [provider]  butalbital-acetaminophen-caffeine (FIORICET) 50-325-40 MG tablet Take 1 tablet by mouth 2 (two) times daily as needed for headache.    [provider]  Cholecalciferol (VITAMIN D3) 25 MCG (1000 UT) CAPS Take 1 capsule by mouth daily.    [provider]  donepezil (ARICEPT) 10 MG tablet Take 10 mg by mouth at bedtime.    [provider]  Ferrous Sulfate Dried (HIGH POTENCY IRON) 65 MG TABS Take 1 tablet by mouth daily.    [provider]  hydrOXYzine (ATARAX/VISTARIL) 10 MG tablet Take 5 mg by mouth at bedtime as needed (For sleep).     [provider]  losartan (COZAAR) 50 MG tablet Take 100 mg by mouth daily.    [provider]  Magnesium 250 MG TABS Take 1 tablet (250 mg total) by mouth daily. 05/31/20    Hongalgi, Maximino Greenland, MD  Melatonin 10 MG TABS Take 1 tablet by mouth at bedtime. Patient taking differently: Take 1 tablet by mouth as needed. 05/31/20   Hongalgi, Maximino Greenland, MD  memantine (NAMENDA) 10 MG tablet Start with one pill at bedtime for 2 weeks then increase to one tablet twice daily 03/10/23   Anson Fret, MD  Multiple Vitamin (MULTIVITAMIN) tablet Take 1 tablet by mouth daily.    [provider]  pantoprazole (PROTONIX) 40 MG tablet Take 40 mg by mouth as needed. 03/06/22   [provider]  potassium gluconate 595 (99 K) MG TABS tablet Take 1 tablet by mouth daily. 07/31/22   [provider]  pravastatin (PRAVACHOL) 40 MG tablet Take 40 mg by mouth daily.    [provider]  Red Yeast Rice Extract (RED YEAST RICE PO) Take by mouth.    [provider]  Turmeric (QC TUMERIC COMPLEX PO) Take 400 mg by mouth.    [provider]  venlafaxine XR (EFFEXOR-XR) 150 MG 24 hr capsule Take 150 mg by mouth daily with breakfast.    [provider]  VITAMIN B COMPLEX-C PO Take 1 tablet by mouth daily.    [provider]  vitamin B-12 (CYANOCOBALAMIN) 1000 MCG tablet Take 1,000 mcg by mouth daily.    [provider]      Allergies    Azithromycin  Review of Systems   Review of Systems  All other systems reviewed and are negative.   Physical Exam Updated Vital Signs BP (!) 146/80   Pulse 63   Resp (!) 27   SpO2 100%  Physical Exam Vitals and nursing note reviewed.  Constitutional:      General: She is not in acute distress.    Appearance: Normal appearance. She is well-developed. She is not toxic-appearing.  HENT:     Head: Normocephalic and atraumatic.  Eyes:     General: Lids are normal.     Conjunctiva/sclera: Conjunctivae normal.     Pupils: Pupils are equal, round, and reactive to light.  Neck:     Thyroid: No thyroid mass.     Trachea: No tracheal deviation.  Cardiovascular:     Rate and  Rhythm: Normal rate and regular rhythm.     Heart sounds: Normal heart sounds. No murmur heard.    No gallop.  Pulmonary:     Effort: Pulmonary effort is normal. No respiratory distress.     Breath sounds: Normal breath sounds. No stridor. No decreased breath sounds, wheezing, rhonchi or rales.  Abdominal:     General: There is no distension.     Palpations: Abdomen is soft.     Tenderness: There is no abdominal tenderness. There is no rebound.  Musculoskeletal:        General: No tenderness. Normal range of motion.     Cervical back: Normal range of motion and neck supple.  Skin:    General: Skin is warm and dry.     Findings: No abrasion or rash.  Neurological:     Mental Status: She is alert and oriented to person, place, and time. Mental status is at baseline.     GCS: GCS eye subscore is 4. GCS verbal subscore is 5. GCS motor subscore is 6.     Cranial Nerves: Cranial nerves are intact. No cranial nerve deficit.     Sensory: No sensory deficit.     Motor: Motor function is intact.  Psychiatric:        Attention and Perception: Attention normal.        Speech: Speech normal.        Behavior: Behavior normal.     ED Results / Procedures / Treatments   Labs (all labs ordered are listed, but only abnormal results are displayed) Labs Reviewed  CULTURE, BLOOD (ROUTINE X 2)  CULTURE, BLOOD (ROUTINE X 2)  CBC WITH DIFFERENTIAL/PLATELET  COMPREHENSIVE METABOLIC PANEL  URINALYSIS, W/ REFLEX TO CULTURE (INFECTION SUSPECTED)  I-STAT CG4 LACTIC ACID, ED    EKG EKG Interpretation Date/Time:  Monday April 14 2023 10:13:39 EDT Ventricular Rate:  101 PR Interval:  129 QRS Duration:  82 QT Interval:  367 QTC Calculation: 476 R Axis:   29  Text Interpretation: Sinus tachycardia Abnormal T, consider ischemia, lateral leads Confirmed by Lorre Nick ([id number]) on 04/14/2023 10:25:46 AM  Radiology No results found.  Procedures Procedures    Medications Ordered in  ED Medications - No data to display  ED Course/ Medical Decision Making/ A&P                                 Medical Decision Making Amount and/or Complexity of Data Reviewed Labs: ordered. Radiology: ordered. ECG/medicine tests: ordered.   Patient is EKG shows sinus tachycardia.  Patient's chest x-ray per my interpretation shows no acute  findings.  Patient's COVID test is negative.  Here are reassuring.  Lactate is normal.  No leukocytosis on CBC.  Low suspicion for sepsis at this time.  No evidence of pneumonia.  Urinalysis also -2.  Suspect patient has a viral infection.  She is well-appearing.  Will discharge home return precautions given        Final Clinical Impression(s) / ED Diagnoses Final diagnoses:  None    Rx / DC Orders ED Discharge Orders     None         Lorre Nick, MD 04/14/23 1333

## 2023-04-14 NOTE — ED Triage Notes (Signed)
Pt BIBA from home with c/o nausea, cough, "feels terrible", flu-like symptoms. SOB, fatigue on exertion.   18LAC LR  RR 24 98% RA HR 100 CBG 86 132/70

## 2023-04-14 NOTE — Discharge Instructions (Signed)
Return here for high fevers, persistent vomiting, or any other problems

## 2023-04-15 ENCOUNTER — Other Ambulatory Visit: Payer: Self-pay

## 2023-04-15 ENCOUNTER — Emergency Department (HOSPITAL_COMMUNITY)
Admission: EM | Admit: 2023-04-15 | Discharge: 2023-04-15 | Disposition: A | Discharge status: Home / Self Care | Payer: Medicare Other | Attending: Emergency Medicine | Admitting: Emergency Medicine

## 2023-04-15 ENCOUNTER — Encounter (HOSPITAL_COMMUNITY): Payer: Self-pay

## 2023-04-15 DIAGNOSIS — R7881 Bacteremia: Secondary | ICD-10-CM | POA: Insufficient documentation

## 2023-04-15 DIAGNOSIS — R197 Diarrhea, unspecified: Secondary | ICD-10-CM | POA: Diagnosis present

## 2023-04-15 DIAGNOSIS — R799 Abnormal finding of blood chemistry, unspecified: Secondary | ICD-10-CM

## 2023-04-15 LAB — BLOOD CULTURE ID PANEL (REFLEXED) - BCID2

## 2023-04-15 NOTE — ED Triage Notes (Signed)
Pt reports:  Abnormal blood work Was seen last night and called this morning and told to come back Believes it was a blood culture

## 2023-04-15 NOTE — Discharge Instructions (Signed)
It was our pleasure to provide your ER care today - we hope that you feel better.  Drink plenty of fluids/stay well hydrated - you may try pedialyte, gatorlyte, or similar electrolyte containing fluid for rehydration.   Follow up closely with primary care doctor in the next 2-3 days if symptoms fail to improve/resolve.  Return to ER if worse, new symptoms, high fevers, severe abdominal pain, persistent vomiting, chest pain, trouble breathing, fainting, or other concern.

## 2023-04-15 NOTE — ED Provider Notes (Signed)
[place] EMERGENCY DEPARTMENT AT [facility] Provider Note   CSN: [id number] Arrival date & time: 04/15/23  1055     History  Chief Complaint  Patient presents with   Abnormal Lab    + Blood cultures    Phyllis [name] is a 82 y.o. female.  Pt with recent diarrhea illness in the past four days. States has liquid stool, ~ 5-6 in past day. No bloody bms.  No abd pain or distension. No vomiting. No specific known ill contacts or bad food ingestion. No recent antibiotic use. No fever or chills. Was in ED for same yesterday - had labs done, and indicates was called today due to blood culture being positive. No fever/chills/sweats.   The history is provided by the patient and medical records.  Abnormal Lab      Home Medications Prior to Admission medications   Medication Sig Start Date End Date Taking? Authorizing Provider  acetaminophen (TYLENOL) 325 MG tablet Take 2 tablets (650 mg total) by mouth every 6 (six) hours as needed for mild pain, moderate pain or fever. 05/31/20   Hongalgi, Maximino Greenland, MD  alendronate (FOSAMAX) 70 MG tablet Take 70 mg by mouth every Saturday. 01/12/18   [provider]  Ascorbic Acid (VITAMIN C) 100 MG tablet Take 100 mg by mouth daily.    [provider]  butalbital-acetaminophen-caffeine (FIORICET) 50-325-40 MG tablet Take 1 tablet by mouth 2 (two) times daily as needed for headache.    [provider]  Cholecalciferol (VITAMIN D3) 25 MCG (1000 UT) CAPS Take 1 capsule by mouth daily.    [provider]  donepezil (ARICEPT) 10 MG tablet Take 10 mg by mouth at bedtime.    [provider]  Ferrous Sulfate Dried (HIGH POTENCY IRON) 65 MG TABS Take 1 tablet by mouth daily.    [provider]  hydrOXYzine (ATARAX/VISTARIL) 10 MG tablet Take 5 mg by mouth at bedtime as needed (For sleep).     [provider]  losartan (COZAAR) 50 MG tablet Take 100 mg by mouth daily.    [provider]  Magnesium 250 MG TABS Take 1 tablet (250 mg total) by mouth daily. 05/31/20   Hongalgi, Maximino Greenland, MD  Melatonin 10 MG TABS Take 1 tablet by mouth at bedtime. Patient taking differently: Take 1 tablet by mouth as needed. 05/31/20   Hongalgi, Maximino Greenland, MD  memantine (NAMENDA) 10 MG tablet Start with one pill at bedtime for 2 weeks then increase to one tablet twice daily 03/10/23   Anson Fret, MD  Multiple Vitamin (MULTIVITAMIN) tablet Take 1 tablet by mouth daily.    [provider]  pantoprazole (PROTONIX) 40 MG tablet Take 40 mg by mouth as needed. 03/06/22   [provider]  potassium gluconate 595 (99 K) MG TABS tablet Take 1 tablet by mouth daily. 07/31/22   [provider]  pravastatin (PRAVACHOL) 40 MG tablet Take 40 mg by mouth daily.    [provider]  Red Yeast Rice Extract (RED YEAST RICE PO) Take by mouth.    [provider]  Turmeric (QC TUMERIC COMPLEX PO) Take 400 mg by mouth.    [provider]  venlafaxine XR (EFFEXOR-XR) 150 MG 24 hr capsule Take 150 mg by mouth daily with breakfast.    [provider]  VITAMIN B COMPLEX-C PO Take 1 tablet by mouth daily.    [provider]  vitamin B-12 (CYANOCOBALAMIN) 1000 MCG  tablet Take 1,000 mcg by mouth daily.    [provider]      Allergies    Azithromycin    Review of Systems   Review of Systems  Constitutional:  Negative for chills, diaphoresis and fever.  HENT:  Negative for sore throat.   Respiratory:  Negative for cough and shortness of breath.   Cardiovascular:  Negative for chest pain.  Gastrointestinal:  Positive for diarrhea. Negative for abdominal pain and vomiting.  Genitourinary:  Negative for dysuria and flank pain.  Musculoskeletal:  Negative for back pain and neck pain.  Skin:  Negative for rash.  Neurological:  Negative for headaches.  Hematological:  Does not bruise/bleed easily.  Psychiatric/Behavioral:  Negative  for confusion.     Physical Exam Updated Vital Signs BP 125/66 (BP Location: Left Arm)   Pulse 96   Temp 97.7 F (36.5 C) (Oral)   Resp 18   Ht 1.626 m (5\' 4" )   Wt 59 kg   SpO2 100%   BMI 22.31 kg/m  Physical Exam Vitals and nursing note reviewed.  Constitutional:      Appearance: Normal appearance. She is well-developed.  HENT:     Head: Atraumatic.     Nose: Nose normal.     Mouth/Throat:     Mouth: Mucous membranes are moist.     Pharynx: Oropharynx is clear.  Eyes:     General: No scleral icterus.    Conjunctiva/sclera: Conjunctivae normal.  Neck:     Trachea: No tracheal deviation.     Comments: No stiffness or rigidity.  Cardiovascular:     Rate and Rhythm: Normal rate and regular rhythm.     Pulses: Normal pulses.     Heart sounds: Normal heart sounds. No murmur heard.    No friction rub. No gallop.  Pulmonary:     Effort: Pulmonary effort is normal. No respiratory distress.     Breath sounds: Normal breath sounds.  Abdominal:     General: Bowel sounds are normal. There is no distension.     Palpations: Abdomen is soft.     Tenderness: There is no abdominal tenderness. There is no guarding.  Genitourinary:    Comments: No cva tenderness.  Musculoskeletal:        General: No swelling or tenderness.     Cervical back: Normal range of motion and neck supple. No rigidity. No muscular tenderness.     Right lower leg: No edema.     Left lower leg: No edema.  Skin:    General: Skin is warm and dry.     Findings: No rash.  Neurological:     Mental Status: She is alert.     Comments: Alert, speech normal. Steady gait.   Psychiatric:        Mood and Affect: Mood normal.     ED Results / Procedures / Treatments   Labs (all labs ordered are listed, but only abnormal results are displayed) Labs Reviewed - No data to display  EKG None  Radiology DG Chest [facility] 1 View  Result Date: 04/14/2023 CLINICAL DATA:  Cough.  Shortness of breath.  Flu-like  symptoms. EXAM: PORTABLE CHEST 1 VIEW COMPARISON:  03/08/2018. FINDINGS: Bilateral lungs appear hyperlucent with coarse bronchovascular markings, concerning for underlying COPD. Stable biapical pleural thickening noted. Bilateral lungs otherwise appear clear. No dense consolidation or lung collapse. Apparent blunting of bilateral lateral costophrenic angles may be due to trace pleural effusion versus superimposed lung parenchymal opacities. Normal cardio-mediastinal  silhouette. No acute osseous abnormalities. The soft tissues are within normal limits. IMPRESSION: 1. 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 2. Possible trace bilateral pleural effusions versus artifact. 3. No focal pulmonary consolidation. Electronically Signed   By: Jules Schick M.D.   On: 04/14/2023 11:22    Procedures Procedures    Medications Ordered in ED Medications - No data to display  ED Course/ Medical Decision Making/ A&P                                 Medical Decision Making Problems Addressed: Acute diarrhea: acute illness or injury with systemic symptoms that poses a threat to life or bodily functions Contamination of blood culture: acute illness or injury  Amount and/or Complexity of Data Reviewed External Data Reviewed: labs and notes. Labs:  Decision-making details documented in ED Course. Radiology: independent interpretation performed. Decision-making details documented in ED Course.   Reviewed nursing notes and prior charts for additional history.   Recent labs reviewed/interpreted by me - lactate normal, wbc normal. Ua neg for uti.   Blood culture is positive for staph epi - pt with no fever/chills/sweats, felt c/w skin contaminant.   Recent cxr reviewed/interpreted by me - no pna.   Pt appears stable for d/c.   Return precautions provided.         Final Clinical Impression(s) / ED Diagnoses Final diagnoses:  None    Rx / DC Orders ED Discharge Orders     None         Cathren Laine,  MD 04/15/23 1212

## 2023-04-15 NOTE — ED Notes (Signed)
Lab called 04/15/2023 at 8:40am. Stated that patient had 4/4 positive blood culture bottles gram positive cocci and cluster growing staph epi. ED Dr. Freida Busman stated that patient needs to come back for further treatment. I called patient, she stated she is finding a ride and coming back.

## 2023-04-19 LAB — CULTURE, BLOOD (ROUTINE X 2)
Special Requests: ADEQUATE
Special Requests: ADEQUATE

## 2023-04-20 ENCOUNTER — Telehealth (HOSPITAL_BASED_OUTPATIENT_CLINIC_OR_DEPARTMENT_OTHER): Payer: Self-pay | Admitting: *Deleted

## 2023-04-20 NOTE — Progress Notes (Signed)
ED Antimicrobial Stewardship Positive Culture Follow Up   Phyllis [name] is an 82 y.o. female who presented to [facility] on 04/15/2023 with a chief complaint of  Chief Complaint  Patient presents with   Abnormal Lab    + Blood cultures    Recent Results (from the past 720 hour(s))  SARS Coronavirus 2 by RT PCR (hospital order, performed in [facility] hospital lab) *cepheid single result test* Anterior Nasal Swab     Status: None   Collection Time: 04/14/23 10:09 AM   Specimen: Anterior Nasal Swab  Result Value Ref Range Status   SARS Coronavirus 2 by RT PCR NEGATIVE NEGATIVE Final    Comment: (NOTE) SARS-CoV-2 target nucleic acids are NOT DETECTED.  The SARS-CoV-2 RNA is generally detectable in upper and lower respiratory specimens during the acute phase of infection. The lowest concentration of SARS-CoV-2 viral copies this assay can detect is 250 copies / mL. A negative result does not preclude SARS-CoV-2 infection and should not be used as the sole basis for treatment or other patient management decisions.  A negative result may occur with improper specimen collection / handling, submission of specimen other than nasopharyngeal swab, presence of viral mutation(s) within the areas targeted by this assay, and inadequate number of viral copies (<250 copies / mL). A negative result must be combined with clinical observations, patient history, and epidemiological information.  Fact Sheet for Patients:   RoadLapTop.co.za  Fact Sheet for Healthcare Providers: http://kim-miller.com/  This test is not yet approved or  cleared by the [place] FDA and has been authorized for detection and/or diagnosis of SARS-CoV-2 by FDA under an Emergency Use Authorization (EUA).  This EUA will remain in effect (meaning this test can be used) for the duration of the COVID-19 declaration under Section 564(b)(1) of the Act, 21 U.S.C. section  360bbb-3(b)(1), unless the authorization is terminated or revoked sooner.  Performed at [facility], [address] [place], [place] [address]   Culture, blood (Routine X 2) w Reflex to ID Panel     Status: Abnormal   Collection Time: 04/14/23 10:23 AM   Specimen: BLOOD  Result Value Ref Range Status   Specimen Description   Final    BLOOD RIGHT ANTECUBITAL Performed at [facility], [address] [place], [place] [address]    Special Requests   Final    BOTTLES DRAWN AEROBIC AND ANAEROBIC Blood Culture adequate volume Performed at [facility], [address], [place], [place] [address]    Culture  Setup Time   Final    GRAM POSITIVE COCCI IN CLUSTERS IN BOTH AEROBIC AND ANAEROBIC BOTTLES CRITICAL VALUE NOTED.  VALUE IS CONSISTENT WITH PREVIOUSLY REPORTED AND CALLED VALUE.    Culture (A)  Final    STAPHYLOCOCCUS HOMINIS STAPHYLOCOCCUS EPIDERMIDIS SUSCEPTIBILITIES PERFORMED ON PREVIOUS CULTURE WITHIN THE LAST 5 DAYS. Performed at [facility], [address] [place], [place] [address]    Report Status 04/19/2023 FINAL  Final  Culture, blood (Routine X 2) w Reflex to ID Panel     Status: Abnormal   Collection Time: 04/14/23 10:27 AM   Specimen: BLOOD  Result Value Ref Range Status   Specimen Description   Final    BLOOD LEFT ANTECUBITAL Performed at [facility], [address] [place], [place] [address]    Special Requests   Final    BOTTLES DRAWN AEROBIC AND ANAEROBIC Blood Culture adequate volume Performed at [facility], [address] [place], [place] [address]    Culture  Setup Time   Final    GRAM POSITIVE COCCI IN CLUSTERS IN BOTH AEROBIC AND ANAEROBIC BOTTLES CRITICAL RESULT CALLED TO, READ BACK BY AND VERIFIED WITH: RN Zara Chess [phone] FCP Performed at [facility], [address] [place], [place] [address]    Culture (A)  Final     STAPHYLOCOCCUS HOMINIS STAPHYLOCOCCUS EPIDERMIDIS    Report Status 04/19/2023 FINAL  Final   Organism ID, Bacteria STAPHYLOCOCCUS HOMINIS  Final   Organism ID, Bacteria STAPHYLOCOCCUS EPIDERMIDIS  Final      Susceptibility   Staphylococcus epidermidis - MIC*    CIPROFLOXACIN <=0.5 SENSITIVE Sensitive     ERYTHROMYCIN <=0.25 SENSITIVE Sensitive     GENTAMICIN <=0.5 SENSITIVE Sensitive     OXACILLIN <=0.25 SENSITIVE Sensitive     TETRACYCLINE 2 SENSITIVE Sensitive     VANCOMYCIN 2 SENSITIVE Sensitive     TRIMETH/SULFA <=10 SENSITIVE Sensitive     CLINDAMYCIN <=0.25 SENSITIVE Sensitive     RIFAMPIN <=0.5 SENSITIVE Sensitive     Inducible Clindamycin NEGATIVE Sensitive     * STAPHYLOCOCCUS EPIDERMIDIS   Staphylococcus hominis - MIC*    CIPROFLOXACIN <=0.5 SENSITIVE Sensitive     ERYTHROMYCIN <=0.25 SENSITIVE Sensitive     GENTAMICIN 4 SENSITIVE Sensitive     OXACILLIN <=0.25 SENSITIVE Sensitive     TETRACYCLINE <=1 SENSITIVE Sensitive     VANCOMYCIN <=0.5 SENSITIVE Sensitive     TRIMETH/SULFA <=10 SENSITIVE Sensitive     CLINDAMYCIN <=0.25 SENSITIVE Sensitive     RIFAMPIN <=0.5 SENSITIVE Sensitive     Inducible Clindamycin NEGATIVE Sensitive     * STAPHYLOCOCCUS HOMINIS  Blood Culture ID Panel (Reflexed)     Status: Abnormal   Collection Time: 04/14/23 10:27 AM  Result Value Ref Range Status   Enterococcus faecalis NOT DETECTED NOT DETECTED Final   Enterococcus Faecium NOT DETECTED NOT DETECTED Final   Listeria monocytogenes NOT DETECTED NOT DETECTED Final   Staphylococcus species DETECTED (A) NOT DETECTED Final    Comment: CRITICAL RESULT CALLED TO, READ BACK BY AND VERIFIED WITH: RN CAMERON KISES 0845 [phone] FCP    Staphylococcus aureus (BCID) NOT DETECTED NOT DETECTED Final   Staphylococcus epidermidis DETECTED (A) NOT DETECTED Final    Comment: Methicillin (oxacillin) resistant coagulase negative staphylococcus. Possible blood culture contaminant (unless isolated from more  than one blood culture draw or clinical case suggests pathogenicity). No antibiotic treatment is indicated for blood  culture contaminants. CRITICAL RESULT CALLED TO, READ BACK BY AND VERIFIED WITH: RN CAMERON KISES 0845 [phone] FCP    Staphylococcus lugdunensis NOT DETECTED NOT DETECTED Final   Streptococcus species NOT DETECTED NOT DETECTED Final   Streptococcus agalactiae NOT DETECTED NOT DETECTED Final   Streptococcus pneumoniae NOT DETECTED NOT DETECTED Final   Streptococcus pyogenes NOT DETECTED NOT DETECTED Final   A.calcoaceticus-baumannii NOT DETECTED NOT DETECTED Final   Bacteroides fragilis NOT DETECTED NOT DETECTED Final   Enterobacterales NOT DETECTED NOT DETECTED Final   Enterobacter cloacae complex NOT DETECTED NOT DETECTED Final   Escherichia coli NOT DETECTED NOT DETECTED Final   Klebsiella aerogenes NOT DETECTED NOT DETECTED Final   Klebsiella oxytoca NOT DETECTED NOT DETECTED Final   Klebsiella pneumoniae NOT DETECTED NOT DETECTED Final   Proteus species NOT DETECTED NOT DETECTED Final   Salmonella species NOT DETECTED NOT DETECTED Final   Serratia marcescens NOT DETECTED NOT DETECTED Final   Haemophilus influenzae NOT  DETECTED NOT DETECTED Final   Neisseria meningitidis NOT DETECTED NOT DETECTED Final   Pseudomonas aeruginosa NOT DETECTED NOT DETECTED Final   Stenotrophomonas maltophilia NOT DETECTED NOT DETECTED Final   Candida albicans NOT DETECTED NOT DETECTED Final   Candida auris NOT DETECTED NOT DETECTED Final   Candida glabrata NOT DETECTED NOT DETECTED Final   Candida krusei NOT DETECTED NOT DETECTED Final   Candida parapsilosis NOT DETECTED NOT DETECTED Final   Candida tropicalis NOT DETECTED NOT DETECTED Final   Cryptococcus neoformans/gattii NOT DETECTED NOT DETECTED Final   Methicillin resistance mecA/C DETECTED (A) NOT DETECTED Final    Comment: CRITICAL RESULT CALLED TO, READ BACK BY AND VERIFIED WITH: RN Zara Chess [phone] FCP Performed at  [facility], [address], [place], [place] [address]    Review of progress notes shows that patient was called on 10/22 and instructed to return to the ED for workup of these blood cultures. MD note 10/22 felt consistent with skin contaminant. Has already been addressed, no further action required at this time.    Jodelle Red Jaspal Pultz 04/20/2023, 12:24 PM Clinical Pharmacist [phone]

## 2023-04-20 NOTE — Telephone Encounter (Signed)
Post ED Visit - Positive Culture Follow-up  Culture report reviewed by antimicrobial stewardship pharmacist: [facility] []  Enzo Bi, Pharm.D. []  Celedonio Miyamoto, [address].D., BCPS AQ-ID []  Garvin Fila, Pharm.D., BCPS []  Georgina Pillion, Pharm.D., BCPS []  [place], [place].D., BCPS, AAHIVP []  Estella Husk, Pharm.D., BCPS, AAHIVP []  Lysle Pearl, PharmD, BCPS []  Phillips Climes, PharmD, BCPS []  Agapito Games, PharmD, BCPS []  Verlan Friends, PharmD []  Mervyn Gay, PharmD, BCPS []  Vinnie Level, PharmD  [facility] []  Len Childs, PharmD []  Greer Pickerel, PharmD []  Adalberto Cole, PharmD []  Perlie Gold, Rph []  Lonell Face) Jean Rosenthal, PharmD []  Earl Many, PharmD []  Junita Push, PharmD []  Dorna Leitz, PharmD []  Terrilee Files, PharmD []  Lynann Beaver, PharmD [x]  Keturah Barre, PharmD []  Loralee Pacas, PharmD []  Bernadene Person, PharmD   Positive blood culture MD addressed and no further patient follow-up is required at this time.  Patsey Berthold 04/20/2023, 12:32 PM

## 2023-08-14 NOTE — Progress Notes (Shared)
[facility] - Clinic Note  08/18/2023     CHIEF COMPLAINT Patient presents for No chief complaint on file.   HISTORY OF PRESENT ILLNESS: Phyllis [name] is a 83 y.o. female who presents to the clinic today for:     Pt states she has started using a magnifying glass when she reads  Referring physician: Sinda Du, MD [address] [place],  [place] [address]  HISTORICAL INFORMATION:   Selected notes from the MEDICAL RECORD NUMBER Referred by Dr. Cathey Endow for BRVO OS LEE:  Ocular Hx- PMH-    CURRENT MEDICATIONS: No current outpatient medications on file. (Ophthalmic Drugs)   No current facility-administered medications for this visit. (Ophthalmic Drugs)   Current Outpatient Medications (Other)  Medication Sig   acetaminophen (TYLENOL) 325 MG tablet Take 2 tablets (650 mg total) by mouth every 6 (six) hours as needed for mild pain, moderate pain or fever.   alendronate (FOSAMAX) 70 MG tablet Take 70 mg by mouth every Saturday.   Ascorbic Acid (VITAMIN C) 100 MG tablet Take 100 mg by mouth daily.   butalbital-acetaminophen-caffeine (FIORICET) 50-325-40 MG tablet Take 1 tablet by mouth 2 (two) times daily as needed for headache.   Cholecalciferol (VITAMIN D3) 25 MCG (1000 UT) CAPS Take 1 capsule by mouth daily.   donepezil (ARICEPT) 10 MG tablet Take 10 mg by mouth at bedtime.   Ferrous Sulfate Dried (HIGH POTENCY IRON) 65 MG TABS Take 1 tablet by mouth daily.   hydrOXYzine (ATARAX/VISTARIL) 10 MG tablet Take 5 mg by mouth at bedtime as needed (For sleep).    losartan (COZAAR) 50 MG tablet Take 100 mg by mouth daily.   Magnesium 250 MG TABS Take 1 tablet (250 mg total) by mouth daily.   Melatonin 10 MG TABS Take 1 tablet by mouth at bedtime. (Patient taking differently: Take 1 tablet by mouth as needed.)   memantine (NAMENDA) 10 MG tablet Start with one pill at bedtime for 2 weeks then increase to one tablet twice daily   Multiple Vitamin (MULTIVITAMIN)  tablet Take 1 tablet by mouth daily.   pantoprazole (PROTONIX) 40 MG tablet Take 40 mg by mouth as needed.   potassium gluconate 595 (99 K) MG TABS tablet Take 1 tablet by mouth daily.   pravastatin (PRAVACHOL) 40 MG tablet Take 40 mg by mouth daily.   Red Yeast Rice Extract (RED YEAST RICE PO) Take by mouth.   Turmeric (QC TUMERIC COMPLEX PO) Take 400 mg by mouth.   venlafaxine XR (EFFEXOR-XR) 150 MG 24 hr capsule Take 150 mg by mouth daily with breakfast.   VITAMIN B COMPLEX-C PO Take 1 tablet by mouth daily.   vitamin B-12 (CYANOCOBALAMIN) 1000 MCG tablet Take 1,000 mcg by mouth daily.   No current facility-administered medications for this visit. (Other)   REVIEW OF SYSTEMS:      ALLERGIES Allergies  Allergen Reactions   Azithromycin     Developed a rash right after she quit taking it   PAST MEDICAL HISTORY Past Medical History:  Diagnosis Date   Abdominal pain, unspecified site    Acute sinusitis, unspecified    Complication of anesthesia    Hard toe wake up from too much medicine   Contact dermatitis and other eczema due to plants (except food)    Cough    Cramp of limb    Depression    Depressive disorder, not elsewhere classified    Essential hypertension, benign    Headache(784.0)  High cholesterol    Hypertension    Lumbago    Migraine    Observation for suspected cardiovascular disease    Osteoporosis, unspecified    Other and unspecified hyperlipidemia    Rash and other nonspecific skin eruption    Rectocele    Reflux esophagitis    Unspecified constipation    Past Surgical History:  Procedure Laterality Date   APPENDECTOMY     BREAST BIOPSY     Percutaneous needle core   CHOLECYSTECTOMY N/A 05/28/2020   Procedure: LAPAROSCOPIC CHOLECYSTECTOMY;  Surgeon: Gaynelle Adu, MD;  Location[facility] OR;  Service: General;  Laterality: N/A;   ESOPHAGOGASTRODUODENOSCOPY (EGD) WITH PROPOFOL N/A 03/08/2018   Procedure: ESOPHAGOGASTRODUODENOSCOPY (EGD) WITH  PROPOFOL;  Surgeon: Willis Modena, MD;  Location: [facility] ENDOSCOPY;  Service: Endoscopy;  Laterality: N/A;   FAMILY HISTORY Family History  Problem Relation Age of Onset   Heart disease Mother    ADD / ADHD Son    Hypertension Other    Coronary artery disease Other    Alzheimer's disease Neg Hx    Dementia Neg Hx    SOCIAL HISTORY Social History   Tobacco Use   Smoking status: Former    Current packs/day: 0.00    Average packs/day: 2.0 packs/day for 12.0 years (24.0 ttl pk-yrs)    Types: Cigarettes    Start date: 06/24/1973    Quit date: 06/24/1985    Years since quitting: 38.1   Smokeless tobacco: Never  Vaping Use   Vaping status: Never Used  Substance Use Topics   Alcohol use: No   Drug use: No       OPHTHALMIC EXAM:  Not recorded    IMAGING AND PROCEDURES  Imaging and Procedures for 08/18/2023          ASSESSMENT/PLAN:    ICD-10-CM   1. Retinal hemorrhage of left eye  H35.62     2. 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 Essential hypertension  I10     4. Hypertensive retinopathy of both eyes  H35.033     5. Pseudophakia, both eyes  Z96.1      1-4. BRVO with retinal hemorrhages OS  - pt reports ED visit (01.27.24) for elevated BP to >200 SBP, >100 DBP  - pt reports memory problems that likely contributed to noncompliance of BP meds - BCVA 20/25 -- stable - exam shows flame hemes and IRH along inferior arcades -- persistent / slightly improved - OCT still without CME OS - no ophthalmic intervention recommended at this time, but discussed importance of tight BP control  - f/u 4-6 months, sooner prn, DFE, possible injection  5. Pseudophakia OU  - s/p CE/IOL OU (Dr. Cathey Endow)  - IOLs in good position, doing well  - monitor  Ophthalmic Meds Ordered this visit:  No orders of the defined types were placed in this encounter.    No follow-ups on file.  There are no Patient Instructions on file for this visit.   Explained the  diagnoses, plan, and follow up with the patient and they expressed understanding.  Patient expressed understanding of the importance of proper follow up care.   This document serves as a record of services personally performed by Karie Chimera, MD, PhD. It was created on their behalf by Glee Arvin. Manson Passey, OA an ophthalmic technician. The creation of this record is the provider's dictation and/or activities during the visit.    Electronically signed by: Glee Arvin.  Manson Passey, OA 08/14/23 9:10 AM   Karie Chimera, M.D., Ph.D. Diseases & Surgery of the Retina and Vitreous [facility]      Abbreviations: M myopia (nearsighted); A astigmatism; H hyperopia (farsighted); P presbyopia; Mrx spectacle prescription;  CTL contact lenses; OD right eye; OS left eye; OU both eyes  XT exotropia; ET esotropia; PEK punctate epithelial keratitis; PEE punctate epithelial erosions; DES dry eye syndrome; MGD meibomian gland dysfunction; ATs artificial tears; PFAT's preservative free artificial tears; NSC nuclear sclerotic cataract; PSC posterior subcapsular cataract; ERM epi-retinal membrane; PVD posterior vitreous detachment; RD retinal detachment; DM diabetes mellitus; DR diabetic retinopathy; NPDR non-proliferative diabetic retinopathy; PDR proliferative diabetic retinopathy; CSME clinically significant macular edema; DME diabetic macular edema; dbh dot blot hemorrhages; CWS cotton wool spot; POAG primary open angle glaucoma; C/D cup-to-disc ratio; HVF humphrey visual field; GVF goldmann visual field; OCT optical coherence tomography; IOP intraocular pressure; BRVO Branch retinal vein occlusion; CRVO central retinal vein occlusion; CRAO central retinal artery occlusion; BRAO branch retinal artery occlusion; RT retinal tear; SB scleral buckle; PPV pars plana vitrectomy; VH Vitreous hemorrhage; PRP panretinal laser photocoagulation; IVK intravitreal kenalog; VMT vitreomacular traction; MH Macular hole;   NVD neovascularization of the disc; NVE neovascularization elsewhere; AREDS age related eye disease study; ARMD age related macular degeneration; POAG primary open angle glaucoma; EBMD epithelial/anterior basement membrane dystrophy; ACIOL anterior chamber intraocular lens; IOL intraocular lens; PCIOL posterior chamber intraocular lens; Phaco/IOL phacoemulsification with intraocular lens placement; PRK photorefractive keratectomy; LASIK laser assisted in situ keratomileusis; HTN hypertension; DM diabetes mellitus; COPD chronic obstructive pulmonary disease

## 2023-08-18 ENCOUNTER — Encounter (INDEPENDENT_AMBULATORY_CARE_PROVIDER_SITE_OTHER): Payer: Medicare Other | Admitting: Ophthalmology

## 2023-08-18 DIAGNOSIS — H3562 Retinal hemorrhage, left eye: Secondary | ICD-10-CM

## 2023-08-18 DIAGNOSIS — Z961 Presence of intraocular lens: Secondary | ICD-10-CM

## 2023-08-18 DIAGNOSIS — H348322 Tributary (branch) retinal vein occlusion, left eye, stable: Secondary | ICD-10-CM

## 2023-08-18 DIAGNOSIS — H35033 Hypertensive retinopathy, bilateral: Secondary | ICD-10-CM

## 2023-08-18 DIAGNOSIS — I10 Essential (primary) hypertension: Secondary | ICD-10-CM

## 2023-08-29 ENCOUNTER — Encounter: Payer: Self-pay | Admitting: Psychology
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
## 2023-09-09 ENCOUNTER — Ambulatory Visit: Payer: Medicare Other | Admitting: Neurology

## 2023-09-24 ENCOUNTER — Encounter (INDEPENDENT_AMBULATORY_CARE_PROVIDER_SITE_OTHER): Admitting: Ophthalmology

## 2023-09-24 DIAGNOSIS — H3562 Retinal hemorrhage, left eye: Secondary | ICD-10-CM

## 2023-09-24 DIAGNOSIS — H348322 Tributary (branch) retinal vein occlusion, left eye, stable: Secondary | ICD-10-CM

## 2023-09-24 DIAGNOSIS — H35033 Hypertensive retinopathy, bilateral: Secondary | ICD-10-CM

## 2023-09-24 DIAGNOSIS — I10 Essential (primary) hypertension: Secondary | ICD-10-CM

## 2023-09-24 DIAGNOSIS — Z961 Presence of intraocular lens: Secondary | ICD-10-CM

## 2023-10-06 ENCOUNTER — Other Ambulatory Visit: Payer: Self-pay | Admitting: Neurology

## 2023-10-29 ENCOUNTER — Ambulatory Visit: Admitting: Adult Health

## 2023-10-29 ENCOUNTER — Encounter: Payer: Self-pay | Admitting: Adult Health

## 2023-10-29 VITALS — BP 122/72 | HR 74 | Ht 64.0 in | Wt 138.0 lb

## 2023-10-29 DIAGNOSIS — G309 Alzheimer's disease, unspecified: Secondary | ICD-10-CM | POA: Diagnosis not present

## 2023-10-29 DIAGNOSIS — G3184 Mild cognitive impairment, so stated: Secondary | ICD-10-CM

## 2023-10-29 NOTE — Progress Notes (Signed)
 Phyllis [name] [address] [place]. [place] [address] [phone]       OFFICE FOLLOW UP NOTE  Ms. Phyllis [name] Date of Birth:  1940-09-22 Medical Record Number:  [id number]    Primary neurologist: Dr. Tresia Fruit Reason for visit: Alzheimer's disease    SUBJECTIVE:  CHIEF COMPLAINT:  Chief Complaint  Patient presents with   Follow-up    Rm 8, Pt w/friend who is a retired Nurse, learning disability. Pt states she is not sure why she is here. Reminded her she was here in Sept seeing Dr. Tresia Fruit, Pt stated she remembers that now.    HPI:    Update 10/29/2023 JM: Patient returns for follow-up visit accompanied by her friend.  At prior visit, initiated memantine  in addition to Aricept  due to continued progression.  She does report continued mild progression since prior visit.  She continues to maintain ADLs and IADLs independently without great difficulty but does have excellent support system who assist as needed.  She has now limited her driving only down [address] to the grocery store and church, has not had any issues with this.  Sleep is overall good, she does have occasional insomnia and will use 1/4 tablet of hydroxyzine  with benefit.  Reports good appetite.  She tries to stay active but this was fairly limited during the winter months as she does not drive in the dark, she did feel increased depression during this time.  She is currently on venlafaxine  for PCP.  She questions other medications to help during episodic depression.  No further questions or concerns at this time.     Update 03/10/2023 Dr. Tresia Fruit: No alzheimer's gene but +CT Pet Amyloid. ATN summary: A low beta-amyloid 42/40 and a high [id number] concentration were  observed. A normal NfL concentration was observed at this time. 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 Starting to lose more memory, can drive to limited places. She had a formal memory test with [facility] (where we need to find). Here wit her friends  who says she has noticed a difference and friend says her son has a family and don;t live very close and she has a big house. Discussed POA/HCPOA. She has her best friend who is here. Patient was lost going to her house last time so has good insight into driving. Patient takes medication for mood. She is still slower states she has church. She is on Aricept . She is still paying her bills but may get her sons name on them she is afaid she is missing something. Her son will help with automating some of that.    Options: We can repeat neurocog testing bc she is concerned about her progressing and planning for the future and that is her most important issue Start Namenda  Lecanumab - has blood on MRI I suspect amyloid so given risk of Aria would not administer 4. Repeat neurocognitive testing for future planning purposes   Patient complains of symptoms per HPI as well as the following symptoms: progressive cognitive decline . Pertinent negatives and positives per HPI. All others negative   Reviewe dimages with patient and friend Recent MRI brain 03/05/2023: IMPRESSION: MRI of the brain with and without contrast showing age-related changes of generalized cerebral atrophy and chronic small vessel disease.  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  There i are also multiple small microhemorrhages noted in the brainstem, bilateral cerebellum and deep white matter .Phyllis [name]  There appears  to be progression of changes of small vessel disease, cerebral atrophy and microhemorrhages compared to previous MRI from 2019    RIGHT CAROTID ARTERY: No significant calcifications of the right common carotid artery. Intermediate waveform maintained. Moderate heterogeneous and partially calcified plaque at the right carotid bifurcation. No significant lumen shadowing. Low resistance waveform of the right ICA. No significant tortuosity.   RIGHT VERTEBRAL ARTERY: Antegrade flow  with low resistance waveform.   LEFT CAROTID ARTERY: No significant calcifications of the left common carotid artery. Intermediate waveform maintained. Moderate heterogeneous and partially calcified plaque at the left carotid bifurcation. No significant lumen shadowing. Low resistance waveform of the left ICA. No significant tortuosity.   LEFT VERTEBRAL ARTERY:  Antegrade flow with low resistance waveform.   IMPRESSION: Color duplex indicates moderate heterogeneous and calcified plaque, with no hemodynamically significant stenosis by duplex criteria in the extracranial cerebrovascular circulation.    Initial visit 12/16/2022 Dr. Tresia Fruit: We saw patient last in 2019 for memory loss. has Circadian rhythm sleep disorder; Cognitive change; Attention deficit disorder; Essential hypertension, benign; Reflux esophagitis; Transient hypotension; Acute upper GI bleeding; Symptomatic anemia; Near syncope; and Acute cholecystitis on their problem list. 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 Here for worsening cognition.   Here with her sister. Her husband has been gone 11 years. Live alone. She doesn't get out except to church. She is afraid she got lost one day going to a friend's house she has been to a lot. Feels her visualspatial is worsening. She is more confused about what day it is, she has to turn the TV on. She tells stories over and over again but maybe not in the same day. Not asking questions in the same day. Mispacing things/losing them, trying to clean her house out, she left something boiling and forgot and she fell. No Fhx of dementia. Friend provides info, says it I sharder for her to pull the word out, they talk every day. Quit driving. She [name] go to [employer]. No hallucinations or delusions, she suffers from depression and anxiety.  No other focal neurologic deficits, associated symptoms, inciting events or modifiable factors.   Patient complains of symptoms per HPI  as well as the following symptoms: none . Pertinent negatives and positives per HPI. All others negative   Reviewed notes, labs and imaging from outside physicians, which showed:   MRI brain 06/27/2017: IMPRESSION:  This MRI of the brain without contrast shows the following: 1.     Mild age appropriate generalized cortical atrophy. Additionally there is mild cerebellar atrophy. 2.     Moderate chronic microvascular ischemic change in the hemispheres and pons. 3.     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     There are no acute findings.   Labs: 09/17/2022: TSH nml, B12 >1500, Vit D 76      ROS:   14 system review of systems performed and negative with exception of those listed in HPI  PMH:  Past Medical History:  Diagnosis Date   Abdominal pain, unspecified site    Acute sinusitis, unspecified    Complication of anesthesia    Hard toe wake up from too much medicine   Contact dermatitis and other eczema due to plants (except food)    Cough    Cramp of limb    Depression    Depressive disorder, not elsewhere classified    Essential hypertension, benign  Headache(784.0)    High cholesterol    Hypertension    Lumbago    Migraine    Observation for suspected cardiovascular disease    Osteoporosis, unspecified    Other and unspecified hyperlipidemia    Rash and other nonspecific skin eruption    Rectocele    Reflux esophagitis    Unspecified constipation     PSH:  Past Surgical History:  Procedure Laterality Date   APPENDECTOMY     BREAST BIOPSY     Percutaneous needle core   CHOLECYSTECTOMY N/A 05/28/2020   Procedure: LAPAROSCOPIC CHOLECYSTECTOMY;  Surgeon: Aldean Hummingbird, MD;  Location: [facility] OR;  Service: General;  Laterality: N/A;   ESOPHAGOGASTRODUODENOSCOPY (EGD) WITH PROPOFOL  N/A 03/08/2018   Procedure: ESOPHAGOGASTRODUODENOSCOPY (EGD) WITH PROPOFOL ;  Surgeon: Evangeline Hilts, MD;  Location: [facility] ENDOSCOPY;   Service: Endoscopy;  Laterality: N/A;    Social History:  Social History   Socioeconomic History   Marital status: Widowed    Spouse name: Rosse Knipe   Number of children: 1   Years of education: 12   Highest education level: Not on file  Occupational History   Occupation: retired  Tobacco Use   Smoking status: Former    Current packs/day: 0.00    Average packs/day: 2.0 packs/day for 12.0 years (24.0 ttl pk-yrs)    Types: Cigarettes    Start date: 06/24/1973    Quit date: 06/24/1985    Years since quitting: 38.3   Smokeless tobacco: Never  Vaping Use   Vaping status: Never Used  Substance and Sexual Activity   Alcohol use: No   Drug use: No   Sexual activity: Not on file  Other Topics Concern   Not on file  Social History Narrative   Lives at home alone   Right handed   Drinks 1 cup of caffeinated coffee & about 6 cups of decaf drinks daily   Social Drivers of Corporate investment banker Strain: Not on file  Food Insecurity: Low Risk  (09/19/2023)   Received from [facility]   Hunger Vital Sign    Worried About Running Out of Food in the Last Year: Never true    Ran Out of Food in the Last Year: Never true  Transportation Needs: No Transportation Needs (09/19/2023)   Received from [facility]    In the past 12 months, has lack of reliable transportation kept you from medical appointments, meetings, work or from getting things needed for daily living? : No  Physical Activity: Not on file  Stress: Not on file  Social Connections: Not on file  Intimate Partner Violence: Not on file    Family History:  Family History  Problem Relation Age of Onset   Heart disease Mother    ADD / ADHD Son    Hypertension Other    Coronary artery disease Other    Alzheimer's disease Neg Hx    Dementia Neg Hx     Medications:   Current Outpatient Medications on File Prior to Visit  Medication Sig Dispense Refill   acetaminophen  (TYLENOL ) 325 MG tablet  Take 2 tablets (650 mg total) by mouth every 6 (six) hours as needed for mild pain, moderate pain or fever.     alendronate (FOSAMAX) 70 MG tablet Take 70 mg by mouth every Saturday.     Ascorbic Acid  (VITAMIN C) 100 MG tablet Take 100 mg by mouth daily.     butalbital-acetaminophen -caffeine (FIORICET) 50-325-40 MG tablet Take 1 tablet by mouth  2 (two) times daily as needed for headache.     Cholecalciferol  (VITAMIN D3) 25 MCG (1000 UT) CAPS Take 1 capsule by mouth daily.     donepezil  (ARICEPT ) 10 MG tablet Take 10 mg by mouth at bedtime.     Ferrous Sulfate Dried (HIGH POTENCY IRON) 65 MG TABS Take 1 tablet by mouth daily.     hydrOXYzine  (ATARAX /VISTARIL ) 10 MG tablet Take 5 mg by mouth at bedtime as needed (For sleep).      losartan  (COZAAR ) 50 MG tablet Take 100 mg by mouth daily.     Magnesium  250 MG TABS Take 1 tablet (250 mg total) by mouth daily.     Melatonin 10 MG TABS Take 1 tablet by mouth at bedtime. (Patient taking differently: Take 1 tablet by mouth as needed.)     memantine  (NAMENDA ) 10 MG tablet TAKE 1 TABLET BY MOUTH AT BEDTIME FOR 2 WEEKS, THEN INCREASE TO 1 TABLET TWICE DAILY 60 tablet 11   Multiple Vitamin (MULTIVITAMIN) tablet Take 1 tablet by mouth daily.     pantoprazole  (PROTONIX ) 40 MG tablet Take 40 mg by mouth as needed.     potassium gluconate 595 (99 K) MG TABS tablet Take 1 tablet by mouth daily.     pravastatin  (PRAVACHOL ) 40 MG tablet Take 40 mg by mouth daily.     Red Yeast Rice Extract (RED YEAST RICE PO) Take by mouth.     Turmeric (QC TUMERIC COMPLEX PO) Take 400 mg by mouth.     venlafaxine  XR (EFFEXOR -XR) 150 MG 24 hr capsule Take 150 mg by mouth daily with breakfast.     VITAMIN B COMPLEX-C PO Take 1 tablet by mouth daily.     vitamin B-12 (CYANOCOBALAMIN ) 1000 MCG tablet Take 1,000 mcg by mouth daily.     Zinc 50 MG TABS Take 50 mg by mouth daily as needed.     No current facility-administered medications on file prior to visit.    Allergies:    Allergies  Allergen Reactions   Azithromycin     Developed a rash right after she quit taking it      OBJECTIVE:  Physical Exam  Vitals:   10/29/23 1446  BP: 122/72  Pulse: 74  Weight: 138 lb (62.6 kg)  Height: 5\' 4"  (1.626 m)   Body mass index is 23.69 kg/m. No results found.  General: well developed, well nourished, very pleasant elderly Caucasian female, seated, in no evident distress  Neurologic Exam Mental Status: Awake and fully alert.  Fluent speech and language.  Oriented to place and time. Recent memory mildly impaired and remote memory intact. Attention span, concentration and fund of knowledge appropriate during visit. Mood and affect appropriate.  Cranial Nerves: Pupils equal, briskly reactive to light. Extraocular movements full without nystagmus. Visual fields full to confrontation. Hearing intact. Facial sensation intact. Face, tongue, palate moves normally and symmetrically.  Motor: Normal bulk and tone. Normal strength in all tested extremity muscles Sensory.: intact to touch , pinprick , position and vibratory sensation.  Coordination: Rapid alternating movements normal in all extremities. Finger-to-nose and heel-to-shin performed accurately bilaterally. Gait and Station: Arises from chair without difficulty. Stance is normal. Gait demonstrates normal stride length and balance without use of AD.  Reflexes: 1+ and symmetric. Toes downgoing.      10/29/2023    3:11 PM 12/16/2022   10:34 AM  MMSE - Mini Mental State Exam  Orientation to time 5 4  Orientation to Place 4 5  Registration 3 3  Attention/ Calculation 5 5  Recall 1 0  Language- name 2 objects 2 2  Language- repeat 0 1  Language- follow 3 step command 3 3  Language- read & follow direction 1 1  Write a sentence 1 1  Copy design 1 0  Total score 26 25         ASSESSMENT/PLAN: LUNDIN KOPELMAN is a 83 y.o. 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 Discussed importance of routine socialization Can consider adjusting venlafaxine  dosage during the winter months if needed but will defer to PCP    Follow up in 9 months or call earlier if needed   CC:  PCP: Tura Gaines, MD    I spent 30 minutes of face-to-face and non-face-to-face time with patient and friend.  This included previsit chart review, lab review, study review, order entry, electronic health record documentation, patient education and discussion regarding above diagnoses and treatment plan and answered all other questions to patient's satisfaction  Johny Nap, [facility]  [name] [address] [place], [place] [address]  Phone [phone] Fax [phone] Note: This document was prepared with digital dictation and possible smart phrase technology. Any transcriptional errors that result from this process are unintentional.

## 2023-10-29 NOTE — Patient Instructions (Addendum)
 Your Plan:  Continue Aricept  and Namenda  at current dosages  Ensure routine physical and cognitive exercises as well as ensuring good sleep, healthy diet and socialization  Continue to monitor your depression symptoms. If this should worsen, please discuss further with your PCP, consider slightly increasing Effexor  dosage to see if this helps     Follow up in 6 months or call earlier if needed     Thank you for coming to see us  at [facility]. I hope we have been able to provide you high quality care today.  You may receive a patient satisfaction survey over the next few weeks. We would appreciate your feedback and comments so that we may continue to improve ourselves and the health of our patients.

## 2023-12-02 NOTE — Progress Notes (Shared)
 [facility] - Clinic Note  12/15/2023     CHIEF COMPLAINT Patient presents for Retina Follow Up   HISTORY OF PRESENT ILLNESS: Phyllis [name] is a 83 y.o. female who presents to the clinic today for:   HPI     Retina Follow Up   Patient presents with  CRVO/BRVO.  In left eye.  This started 1.5 years ago.  Duration of 10 months.  Since onset it is stable.  I, the attending physician,  performed the HPI with the patient and updated documentation appropriately.        Comments   Pt states no changes in vision, still blurry. Pt feels she may need to update glasses. Pt denies FOL/floaters/pain. Pt does not use ats.      Last edited by Treana Lacour, MD on 12/16/2023 12:31 AM.    Pt feels like her vision has gotten worse, she is using AT's but states it is difficult to keep them moist enough  Referring physician: Waylan Cain, MD [address] [place],  [place] [address]  HISTORICAL INFORMATION:   Selected notes from the MEDICAL RECORD NUMBER Referred by Dr. Waylan for BRVO OS LEE:  Ocular Hx- PMH-    CURRENT MEDICATIONS: No current outpatient medications on file. (Ophthalmic Drugs)   No current facility-administered medications for this visit. (Ophthalmic Drugs)   Current Outpatient Medications (Other)  Medication Sig   acetaminophen  (TYLENOL ) 325 MG tablet Take 2 tablets (650 mg total) by mouth every 6 (six) hours as needed for mild pain, moderate pain or fever.   alendronate (FOSAMAX) 70 MG tablet Take 70 mg by mouth every Saturday.   Ascorbic Acid  (VITAMIN C) 100 MG tablet Take 100 mg by mouth daily.   butalbital-acetaminophen -caffeine (FIORICET) 50-325-40 MG tablet Take 1 tablet by mouth 2 (two) times daily as needed for headache.   Cholecalciferol  (VITAMIN D3) 25 MCG (1000 UT) CAPS Take 1 capsule by mouth daily.   donepezil  (ARICEPT ) 10 MG tablet Take 10 mg by mouth at bedtime.   Ferrous Sulfate Dried (HIGH POTENCY IRON) 65 MG TABS Take 1 tablet  by mouth daily.   hydrOXYzine  (ATARAX /VISTARIL ) 10 MG tablet Take 5 mg by mouth at bedtime as needed (For sleep).    losartan  (COZAAR ) 50 MG tablet Take 100 mg by mouth daily.   Magnesium  250 MG TABS Take 1 tablet (250 mg total) by mouth daily.   Melatonin 10 MG TABS Take 1 tablet by mouth at bedtime. (Patient taking differently: Take 1 tablet by mouth as needed.)   memantine  (NAMENDA ) 10 MG tablet TAKE 1 TABLET BY MOUTH AT BEDTIME FOR 2 WEEKS, THEN INCREASE TO 1 TABLET TWICE DAILY   Multiple Vitamin (MULTIVITAMIN) tablet Take 1 tablet by mouth daily.   pantoprazole  (PROTONIX ) 40 MG tablet Take 40 mg by mouth as needed.   potassium gluconate 595 (99 K) MG TABS tablet Take 1 tablet by mouth daily.   pravastatin  (PRAVACHOL ) 40 MG tablet Take 40 mg by mouth daily.   Red Yeast Rice Extract (RED YEAST RICE PO) Take by mouth.   Turmeric (QC TUMERIC COMPLEX PO) Take 400 mg by mouth.   venlafaxine  XR (EFFEXOR -XR) 150 MG 24 hr capsule Take 150 mg by mouth daily with breakfast.   VITAMIN B COMPLEX-C PO Take 1 tablet by mouth daily.   vitamin B-12 (CYANOCOBALAMIN ) 1000 MCG tablet Take 1,000 mcg by mouth daily.   Zinc 50 MG TABS Take 50 mg by mouth daily as needed.  No current facility-administered medications for this visit. (Other)   REVIEW OF SYSTEMS: ROS   Positive for: Eyes, Psychiatric Last edited by Elnor Avelina RAMAN, COT on 12/15/2023 10:04 AM.     ALLERGIES Allergies  Allergen Reactions   Azithromycin     Developed a rash right after she quit taking it   PAST MEDICAL HISTORY Past Medical History:  Diagnosis Date   Abdominal pain, unspecified site    Acute sinusitis, unspecified    Complication of anesthesia    Hard toe wake up from too much medicine   Contact dermatitis and other eczema due to plants (except food)    Cough    Cramp of limb    Depression    Depressive disorder, not elsewhere classified    Essential hypertension, benign    Headache(784.0)    High cholesterol     Hypertension    Lumbago    Migraine    Observation for suspected cardiovascular disease    Osteoporosis, unspecified    Other and unspecified hyperlipidemia    Rash and other nonspecific skin eruption    Rectocele    Reflux esophagitis    Unspecified constipation    Past Surgical History:  Procedure Laterality Date   APPENDECTOMY     BREAST BIOPSY     Percutaneous needle core   CHOLECYSTECTOMY N/A 05/28/2020   Procedure: LAPAROSCOPIC CHOLECYSTECTOMY;  Surgeon: Tanda Locus, MD;  Location: [facility] OR;  Service: General;  Laterality: N/A;   ESOPHAGOGASTRODUODENOSCOPY (EGD) WITH PROPOFOL  N/A 03/08/2018   Procedure: ESOPHAGOGASTRODUODENOSCOPY (EGD) WITH PROPOFOL ;  Surgeon: Burnette Fallow, MD;  Location: [facility] ENDOSCOPY;  Service: Endoscopy;  Laterality: N/A;   FAMILY HISTORY Family History  Problem Relation Age of Onset   Heart disease Mother    ADD / ADHD Son    Hypertension Other    Coronary artery disease Other    Alzheimer's disease Neg Hx    Dementia Neg Hx    SOCIAL HISTORY Social History   Tobacco Use   Smoking status: Former    Current packs/day: 0.00    Average packs/day: 2.0 packs/day for 12.0 years (24.0 ttl pk-yrs)    Types: Cigarettes    Start date: 06/24/1973    Quit date: 06/24/1985    Years since quitting: 38.5   Smokeless tobacco: Never  Vaping Use   Vaping status: Never Used  Substance Use Topics   Alcohol use: No   Drug use: No       OPHTHALMIC EXAM:  Base Eye Exam     Visual Acuity (Snellen - Linear)       Right Left   Dist [place] 20/40 +1 20/30 +2   Dist ph [place] NI NI    Correction: Glasses         Tonometry (Tonopen, 10:11 AM)       Right Left   Pressure 13 13         Pupils       Pupils Dark Light Shape React APD   Right PERRL 3 2 Round Brisk None   Left PERRL 3 2 Round Brisk None         Visual Fields       Left Right    Full Full         Extraocular Movement       Right Left    Full, Ortho Full, Ortho          Neuro/Psych     Oriented x3: Yes   Mood/Affect: Normal  Dilation     Both eyes: 1.0% Mydriacyl, 2.5% Phenylephrine  @ 10:13 AM           Slit Lamp and Fundus Exam     Slit Lamp Exam       Right Left   Lids/Lashes Dermatochalasis - upper lid Dermatochalasis - upper lid   Conjunctiva/Sclera White and quiet White and quiet   Cornea mild arcus, tear film debris, trace PEE, well healed cataract wound Arcus, trace PEE, trace, fine endo pigment, tear film debris, well healed cataract wound   Anterior Chamber deep and clear deep and clear   Iris Round and moderately dilated Round and moderately dilated   Lens PC IOL in good position with open PC PC IOL in good position with open PC   Anterior Vitreous mild syneresis, Posterior vitreous detachment Vitreous syneresis, Posterior vitreous detachment, vitreous condensations, old VH settled inferiorly         Fundus Exam       Right Left   Disc mild Pallor, Sharp rim, mild PPA mild Pallor, Sharp rim, inferior PPA   C/D Ratio 0.2 0.1   Macula Flat, Blunted foveal reflex, RPE mottling and clumping, No heme or edema Flat, Blunted foveal reflex, RPE mottling and clumping, early atrophy, persistent IT punctate IRH -- ?slightly improved   Vessels attenuated, mild tortuosity attenuated, Tortuous, vascular loops along IT arcades and IT macula, +IRH along IT arcades -- improved   Periphery Attached, No heme Attached, DBH IT quad           Refraction     Wearing Rx       Sphere Cylinder Axis Add   Right -1.25 +2.50 101 +2.50   Left -1.00 +1.75 100 +2.50           IMAGING AND PROCEDURES  Imaging and Procedures for 12/15/2023  OCT, Retina - OU - Both Eyes       Right Eye Quality was good. Central Foveal Thickness: 295. Progression has been stable. Findings include normal foveal contour, no SRF, epiretinal membrane, intraretinal fluid (Mild ERM, focal cystic changes nasal and temporal macula).   Left Eye Quality was  good. Central Foveal Thickness: 283. Progression has been stable. Findings include normal foveal contour, no IRF, no SRF, myopic contour (No CME).   Notes *Images captured and stored on drive  Diagnosis / Impression:  NFP, no IRF/SRF OU OD: Mild ERM, focal cystic changes nasal and temporal macula OS: no CME  Clinical management:  See below  Abbreviations: NFP - Normal foveal profile. CME - cystoid macular edema. PED - pigment epithelial detachment. IRF - intraretinal fluid. SRF - subretinal fluid. EZ - ellipsoid zone. ERM - epiretinal membrane. ORA - outer retinal atrophy. ORT - outer retinal tubulation. SRHM - subretinal hyper-reflective material. IRHM - intraretinal hyper-reflective material            ASSESSMENT/PLAN:    ICD-10-CM   1. Retinal hemorrhage of left eye  H35.62 OCT, Retina - OU - Both Eyes    2. 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 Essential hypertension  I10     4. Hypertensive retinopathy of both eyes  H35.033     5. Pseudophakia, both eyes  Z96.1      1-4. BRVO with retinal hemorrhages OS  - pt reports ED visit (01.27.24) for elevated BP to >200 SBP, >100 DBP  - pt reports memory problems that likely contributed to noncompliance of BP meds -  BCVA 20/30 from 20/25 - exam showed flame hemes and IRH along inferior arcades -- persistent / slightly improved - OCT still without CME OS - no ophthalmic intervention recommended at this time, but discussed importance of tight BP control - f/u 1 year, sooner prn, DFE, possible injection   5. Pseudophakia OU  - s/p CE/IOL OU (Dr. Waylan)  - IOLs in good position, doing well  - monitor  Ophthalmic Meds Ordered this visit:  No orders of the defined types were placed in this encounter.    Return in about 1 year (around 12/14/2024) for f/u BRVO OS, DFE, OCT.  There are no Patient Instructions on file for this visit.   Explained the diagnoses, plan, and follow up with the patient  and they expressed understanding.  Patient expressed understanding of the importance of proper follow up care.   This document serves as a record of services personally performed by Redell JUDITHANN Hans, MD, PhD. It was created on their behalf by Avelina Pereyra, COA an ophthalmic technician. The creation of this record is the provider's dictation and/or activities during the visit.   Electronically signed by: Avelina GORMAN Pereyra, COT  12/16/23  12:32 AM   Redell JUDITHANN Hans, M.D., Ph.D. Diseases & Surgery of the Retina and Vitreous [facility]  I have reviewed the above documentation for accuracy and completeness, and I agree with the above. Redell JUDITHANN Hans, M.D., Ph.D. 12/16/23 12:32 AM    Abbreviations: M myopia (nearsighted); A astigmatism; H hyperopia (farsighted); P presbyopia; Mrx spectacle prescription;  CTL contact lenses; OD right eye; OS left eye; OU both eyes  XT exotropia; ET esotropia; PEK punctate epithelial keratitis; PEE punctate epithelial erosions; DES dry eye syndrome; MGD meibomian gland dysfunction; ATs artificial tears; PFAT's preservative free artificial tears; NSC nuclear sclerotic cataract; PSC posterior subcapsular cataract; ERM epi-retinal membrane; PVD posterior vitreous detachment; RD retinal detachment; DM diabetes mellitus; DR diabetic retinopathy; NPDR non-proliferative diabetic retinopathy; PDR proliferative diabetic retinopathy; CSME clinically significant macular edema; DME diabetic macular edema; dbh dot blot hemorrhages; CWS cotton wool spot; POAG primary open angle glaucoma; C/D cup-to-disc ratio; HVF humphrey visual field; GVF goldmann visual field; OCT optical coherence tomography; IOP intraocular pressure; BRVO Branch retinal vein occlusion; CRVO central retinal vein occlusion; CRAO central retinal artery occlusion; BRAO branch retinal artery occlusion; RT retinal tear; SB scleral buckle; PPV pars plana vitrectomy; VH Vitreous hemorrhage; PRP  panretinal laser photocoagulation; IVK intravitreal kenalog; VMT vitreomacular traction; MH Macular hole;  NVD neovascularization of the disc; NVE neovascularization elsewhere; AREDS age related eye disease study; ARMD age related macular degeneration; POAG primary open angle glaucoma; EBMD epithelial/anterior basement membrane dystrophy; ACIOL anterior chamber intraocular lens; IOL intraocular lens; PCIOL posterior chamber intraocular lens; Phaco/IOL phacoemulsification with intraocular lens placement; PRK photorefractive keratectomy; LASIK laser assisted in situ keratomileusis; HTN hypertension; DM diabetes mellitus; COPD chronic obstructive pulmonary disease

## 2023-12-15 ENCOUNTER — Ambulatory Visit (INDEPENDENT_AMBULATORY_CARE_PROVIDER_SITE_OTHER): Admitting: Ophthalmology

## 2023-12-15 ENCOUNTER — Encounter (INDEPENDENT_AMBULATORY_CARE_PROVIDER_SITE_OTHER): Payer: Self-pay | Admitting: Ophthalmology

## 2023-12-15 DIAGNOSIS — H348322 Tributary (branch) retinal vein occlusion, left eye, stable: Secondary | ICD-10-CM | POA: Diagnosis not present

## 2023-12-15 DIAGNOSIS — I10 Essential (primary) hypertension: Secondary | ICD-10-CM | POA: Diagnosis not present

## 2023-12-15 DIAGNOSIS — Z961 Presence of intraocular lens: Secondary | ICD-10-CM

## 2023-12-15 DIAGNOSIS — H3562 Retinal hemorrhage, left eye: Secondary | ICD-10-CM

## 2023-12-15 DIAGNOSIS — H35033 Hypertensive retinopathy, bilateral: Secondary | ICD-10-CM | POA: Diagnosis not present

## 2023-12-16 ENCOUNTER — Encounter (INDEPENDENT_AMBULATORY_CARE_PROVIDER_SITE_OTHER): Payer: Self-pay | Admitting: Ophthalmology

## 2023-12-18 ENCOUNTER — Encounter: Attending: Psychology | Admitting: Psychology

## 2023-12-22 ENCOUNTER — Other Ambulatory Visit: Payer: Self-pay

## 2023-12-22 ENCOUNTER — Emergency Department (HOSPITAL_COMMUNITY)
Admission: EM | Admit: 2023-12-22 | Discharge: 2023-12-22 | Disposition: A | Discharge status: Home / Self Care | Attending: Emergency Medicine | Admitting: Emergency Medicine

## 2023-12-22 ENCOUNTER — Emergency Department (HOSPITAL_COMMUNITY)

## 2023-12-22 ENCOUNTER — Encounter (HOSPITAL_COMMUNITY): Payer: Self-pay | Admitting: Emergency Medicine

## 2023-12-22 DIAGNOSIS — R519 Headache, unspecified: Secondary | ICD-10-CM | POA: Diagnosis not present

## 2023-12-22 DIAGNOSIS — E871 Hypo-osmolality and hyponatremia: Secondary | ICD-10-CM | POA: Insufficient documentation

## 2023-12-22 DIAGNOSIS — R443 Hallucinations, unspecified: Secondary | ICD-10-CM | POA: Diagnosis present

## 2023-12-22 LAB — CBC WITH DIFFERENTIAL/PLATELET
Abs Immature Granulocytes: 0.06 10*3/uL (ref 0.00–0.07)
Basophils Absolute: 0 10*3/uL (ref 0.0–0.1)
Basophils Relative: 0 %
Eosinophils Absolute: 0 10*3/uL (ref 0.0–0.5)
Eosinophils Relative: 0 %
HCT: 40.7 % (ref 36.0–46.0)
Hemoglobin: 13.6 g/dL (ref 12.0–15.0)
Immature Granulocytes: 1 %
Lymphocytes Relative: 9 %
Lymphs Abs: 1 10*3/uL (ref 0.7–4.0)
MCH: 32 pg (ref 26.0–34.0)
MCHC: 33.4 g/dL (ref 30.0–36.0)
MCV: 95.8 fL (ref 80.0–100.0)
Monocytes Absolute: 0.5 10*3/uL (ref 0.1–1.0)
Monocytes Relative: 5 %
Neutro Abs: 8.8 10*3/uL — ABNORMAL HIGH (ref 1.7–7.7)
Neutrophils Relative %: 85 %
Platelets: 247 10*3/uL (ref 150–400)
RBC: 4.25 MIL/uL (ref 3.87–5.11)
RDW: 12.2 % (ref 11.5–15.5)
WBC: 10.5 10*3/uL (ref 4.0–10.5)
nRBC: 0 % (ref 0.0–0.2)

## 2023-12-22 LAB — COMPREHENSIVE METABOLIC PANEL WITH GFR
ALT: 20 U/L (ref 0–44)
AST: 26 U/L (ref 15–41)
Albumin: 4.6 g/dL (ref 3.5–5.0)
Alkaline Phosphatase: 72 U/L (ref 38–126)
Anion gap: 10 (ref 5–15)
BUN: 13 mg/dL (ref 8–23)
CO2: 26 mmol/L (ref 22–32)
Calcium: 10.1 mg/dL (ref 8.9–10.3)
Chloride: 98 mmol/L (ref 98–111)
Creatinine, Ser: 0.6 mg/dL (ref 0.44–1.00)
GFR, Estimated: >60 mL/min (ref >60)
Glucose, Bld: 122 mg/dL — ABNORMAL HIGH (ref 70–99)
Potassium: 4.1 mmol/L (ref 3.5–5.1)
Sodium: 134 mmol/L — ABNORMAL LOW (ref 135–145)
Total Bilirubin: 1 mg/dL (ref 0.0–1.2)
Total Protein: 8.4 g/dL — ABNORMAL HIGH (ref 6.5–8.1)

## 2023-12-22 LAB — URINALYSIS, ROUTINE W REFLEX MICROSCOPIC
Bacteria, UA: NONE SEEN
Bilirubin Urine: NEGATIVE
Glucose, UA: NEGATIVE mg/dL
Ketones, ur: NEGATIVE mg/dL
Leukocytes,Ua: NEGATIVE
Nitrite: NEGATIVE
Protein, ur: NEGATIVE mg/dL
Specific Gravity, Urine: 1.004 — ABNORMAL LOW (ref 1.005–1.030)
pH: 6 (ref 5.0–8.0)

## 2023-12-22 LAB — AMMONIA: Ammonia: <13 umol/L (ref 9–35)

## 2023-12-22 MED ORDER — ACETAMINOPHEN 325 MG PO TABS
650.0000 mg | ORAL_TABLET | Freq: Once | ORAL | Status: AC
  Administered 2023-12-22: 650 mg via ORAL
  Filled 2023-12-22: qty 2

## 2023-12-22 NOTE — ED Provider Notes (Signed)
 [place] EMERGENCY DEPARTMENT AT [facility] Provider Note   CSN: [id number] Arrival date & time: 12/22/23  1254     Patient presents with: Hallucinations   Phyllis [name] is a 83 y.o. female.  {Add pertinent medical, surgical, social history, OB history to HPI:32947} HPI   Pt states when she first woke up this am she was seeing faces.  Pt knew they were not there.  She knew she was hallucinating.  She does have headaches but that is not new.  No fevers or chills.  S he has been having some leg cramping recently.  Pt has never had this issue before.  Prior to Admission medications   Medication Sig Start Date End Date Taking? Authorizing Provider  acetaminophen  (TYLENOL ) 325 MG tablet Take 2 tablets (650 mg total) by mouth every 6 (six) hours as needed for mild pain, moderate pain or fever. 05/31/20   Hongalgi, Anand D, MD  alendronate (FOSAMAX) 70 MG tablet Take 70 mg by mouth every Saturday. 01/12/18   [provider]  Ascorbic Acid  (VITAMIN C) 100 MG tablet Take 100 mg by mouth daily.    [provider]  butalbital-acetaminophen -caffeine (FIORICET) 50-325-40 MG tablet Take 1 tablet by mouth 2 (two) times daily as needed for headache.    [provider]  Cholecalciferol  (VITAMIN D3) 25 MCG (1000 UT) CAPS Take 1 capsule by mouth daily.    [provider]  donepezil  (ARICEPT ) 10 MG tablet Take 10 mg by mouth at bedtime.    [provider]  Ferrous Sulfate Dried (HIGH POTENCY IRON) 65 MG TABS Take 1 tablet by mouth daily.    [provider]  hydrOXYzine  (ATARAX /VISTARIL ) 10 MG tablet Take 5 mg by mouth at bedtime as needed (For sleep).     [provider]  losartan  (COZAAR ) 50 MG tablet Take 100 mg by mouth daily.    [provider]  Magnesium  250 MG TABS Take 1 tablet (250 mg total) by mouth daily. 05/31/20   Hongalgi, Anand D, MD  Melatonin 10 MG TABS Take 1 tablet by mouth at bedtime. Patient taking  differently: Take 1 tablet by mouth as needed. 05/31/20   Hongalgi, Anand D, MD  memantine  (NAMENDA ) 10 MG tablet TAKE 1 TABLET BY MOUTH AT BEDTIME FOR 2 WEEKS, THEN INCREASE TO 1 TABLET TWICE DAILY 10/07/23   Ahern, Antonia B, MD  Multiple Vitamin (MULTIVITAMIN) tablet Take 1 tablet by mouth daily.    [provider]  pantoprazole  (PROTONIX ) 40 MG tablet Take 40 mg by mouth as needed. 03/06/22   [provider]  potassium gluconate 595 (99 K) MG TABS tablet Take 1 tablet by mouth daily. 07/31/22   [provider]  pravastatin  (PRAVACHOL ) 40 MG tablet Take 40 mg by mouth daily.    [provider]  Red Yeast Rice Extract (RED YEAST RICE PO) Take by mouth.    [provider]  Turmeric (QC TUMERIC COMPLEX PO) Take 400 mg by mouth.    [provider]  venlafaxine  XR (EFFEXOR -XR) 150 MG 24 hr capsule Take 150 mg by mouth daily with breakfast.    [provider]  VITAMIN B COMPLEX-C PO Take 1 tablet by mouth daily.    [provider]  vitamin B-12 (CYANOCOBALAMIN ) 1000 MCG tablet Take 1,000 mcg by mouth daily.    [provider]  Zinc 50 MG TABS Take 50 mg by mouth daily as needed.    [provider]  Allergies: Azithromycin    Review of Systems  Updated Vital Signs BP (!) 148/93 (BP Location: Right Arm)   Pulse 84   Temp 97.8 F (36.6 C) (Oral)   Resp 19   SpO2 99%   Physical Exam Vitals and nursing note reviewed.  Constitutional:      General: She is not in acute distress.    Appearance: She is well-developed.  HENT:     Head: Normocephalic and atraumatic.     Right Ear: External ear normal.     Left Ear: External ear normal.   Eyes:     General: No scleral icterus.       Right eye: No discharge.        Left eye: No discharge.     Conjunctiva/sclera: Conjunctivae normal.   Neck:     Trachea: No tracheal deviation.   Cardiovascular:     Rate and Rhythm: Normal rate and regular rhythm.   Pulmonary:     Effort: Pulmonary effort is normal. No respiratory distress.     Breath sounds: Normal breath sounds. No stridor. No wheezing or rales.  Abdominal:     General: Bowel sounds are normal. There is no distension.     Palpations: Abdomen is soft.     Tenderness: There is no abdominal tenderness. There is no guarding or rebound.   Musculoskeletal:        General: No tenderness or deformity.     Cervical back: Neck supple.   Skin:    General: Skin is warm and dry.     Findings: No rash.   Neurological:     General: No focal deficit present.     Mental Status: She is alert and oriented to person, place, and time.     Cranial Nerves: No cranial nerve deficit, dysarthria or facial asymmetry.     Sensory: No sensory deficit.     Motor: No abnormal muscle tone or seizure activity.     Coordination: Coordination normal.   Psychiatric:        Mood and Affect: Mood normal.     (all labs ordered are listed, but only abnormal results are displayed) Labs Reviewed - No data to display  EKG: EKG Interpretation Date/Time:  Monday December 22 2023 13:09:19 EDT Ventricular Rate:  87 PR Interval:  146 QRS Duration:  82 QT Interval:  388 QTC Calculation: 467 R Axis:   44  Text Interpretation: Sinus arrhythmia Abnormal R-wave progression, early transition Abnormal T, consider ischemia, lateral leads No significant change since last tracing Confirmed by Randol Simmonds [phone]) on 12/22/2023 1:22:45 PM  Radiology: No results found.  {Document cardiac monitor, telemetry assessment procedure when appropriate:32947} Procedures   Medications Ordered in the ED - No data to display    {Click here for ABCD2, HEART and other calculators REFRESH Note before signing:1}                              Medical Decision Making  ***  {Document critical care time when appropriate  Document review of labs and clinical decision tools ie CHADS2VASC2, etc  Document your independent review of  radiology images and any outside records  Document your discussion with family members, caretakers and with consultants  Document social determinants of health affecting pt's care  Document your decision making why or why not admission, treatments were needed:32947:::1}   Final diagnoses:  None    ED Discharge Orders  None

## 2023-12-22 NOTE — Discharge Instructions (Addendum)
 Do not take the leg cramp pills leg more.  Follow-up with your neurologist or we can refer you to a neurologist

## 2023-12-22 NOTE — ED Triage Notes (Signed)
 Patient lives at home by herself. She presents due to hallucinations. While in bed last night she saw faces and houses that were not there after taking a leg cramp pill. She thinks she may have taken too many. EMS notes pin point pupils but the past is not prescribes a narcotic. A&O x4 but confused.    HX: Dementia, short term memory loss  EMS vitals: 170/77 BP 68 HR 99% SPO2 on room air 133 CBG 97.1 temp

## 2023-12-23 NOTE — Progress Notes (Signed)
 [facility] Transitional Care Management     Situation   Phyllis [name] is a 83 y.o. female who was contacted today for a transitional care outreach for ED encounter.  Admission Date: n/a  Discharge Date:12/22/2023   Institution: [facility]  Diagnosis:  Hallucinations  Is this visit eligible for TCM? No  Background   Since Discharge: HN phone call with pt. She reports she is feeling a lot better. She reports she is not having any further hallucinations. Pt will call PCP office if she has any needs or concerns.   Primary Care Provider on Record: Kristen Diane Kaplan, PA-C   Assessment    General Assessment     Type of Visit Telephone   Assessment Completed With Patient   Interpreter Used No           Recommendation    PCP/specialist notified: Yes  Referral Made: No  Referrals made to other disciplines: None   No future appointments.    Augustin Saupe, RN, [employer] [phone]   Electronically signed by: Augustin Saupe, RN 12/23/2023 1:38 PM

## 2023-12-25 ENCOUNTER — Ambulatory Visit: Admitting: Adult Health

## 2024-03-30 NOTE — Telephone Encounter (Signed)
 Called and left voicemail for patient to reschedule appointment on 11/17 with Dr Ines.  If patient calls back, they can be rescheduled with Dr Buck

## 2024-04-08 ENCOUNTER — Other Ambulatory Visit: Payer: Self-pay

## 2024-04-08 NOTE — Telephone Encounter (Signed)
 Last filled by patient on 04/05/24 with 1 refill left   Last office visit : 10/29/23  Next office visit : N/A - patient has not responded to Mychart messages or Called to reshedule appointment patient of Ines   - Continue memantine  10 mg twice daily

## 2024-04-09 MED ORDER — MEMANTINE HCL 10 MG PO TABS: ORAL_TABLET | ORAL | 1 refills | Status: AC

## 2024-04-30 ENCOUNTER — Telehealth: Payer: Self-pay | Admitting: Adult Health

## 2024-04-30 NOTE — Telephone Encounter (Signed)
 Appointment made for a f/u with new provider for pt.

## 2024-05-10 ENCOUNTER — Ambulatory Visit: Admitting: Neurology

## 2024-08-10 ENCOUNTER — Ambulatory Visit: Admitting: Neurology

## 2024-12-14 ENCOUNTER — Encounter (INDEPENDENT_AMBULATORY_CARE_PROVIDER_SITE_OTHER): Admitting: Ophthalmology
